# Patient Record
Sex: Female | Born: 1950 | Race: White | Hispanic: No | Marital: Married | Smoking: Never smoker
Health system: Southern US, Community
[De-identification: ages and names within clinical notes are randomized; demographics above are authoritative.]

## PROBLEM LIST (undated history)

## (undated) DIAGNOSIS — R011 Cardiac murmur, unspecified: Secondary | ICD-10-CM

## (undated) DIAGNOSIS — I10 Essential (primary) hypertension: Secondary | ICD-10-CM

## (undated) DIAGNOSIS — E785 Hyperlipidemia, unspecified: Secondary | ICD-10-CM

## (undated) HISTORY — DX: Cardiac murmur, unspecified: R01.1

## (undated) HISTORY — DX: Hyperlipidemia, unspecified: E78.5

## (undated) HISTORY — PX: OVARIAN CYST REMOVAL: SHX89

## (undated) HISTORY — PX: APPENDECTOMY: SHX54

## (undated) HISTORY — DX: Essential (primary) hypertension: I10

---

## 1998-03-18 ENCOUNTER — Emergency Department (HOSPITAL_COMMUNITY): Admission: EM | Admit: 1998-03-18 | Discharge: 1998-03-19 | Payer: Self-pay | Admitting: Emergency Medicine

## 1998-03-20 ENCOUNTER — Other Ambulatory Visit: Admission: RE | Admit: 1998-03-20 | Discharge: 1998-03-20 | Payer: Self-pay | Admitting: Gynecology

## 1998-03-20 ENCOUNTER — Other Ambulatory Visit: Admission: RE | Admit: 1998-03-20 | Discharge: 1998-03-20 | Payer: Self-pay | Admitting: Internal Medicine

## 1998-03-21 ENCOUNTER — Other Ambulatory Visit: Admission: RE | Admit: 1998-03-21 | Discharge: 1998-03-21 | Payer: Self-pay | Admitting: Internal Medicine

## 1998-03-22 ENCOUNTER — Other Ambulatory Visit: Admission: RE | Admit: 1998-03-22 | Discharge: 1998-03-22 | Payer: Self-pay | Admitting: Internal Medicine

## 1998-03-29 ENCOUNTER — Ambulatory Visit (HOSPITAL_COMMUNITY): Admission: RE | Admit: 1998-03-29 | Discharge: 1998-03-29 | Payer: Self-pay | Admitting: Internal Medicine

## 2003-12-29 ENCOUNTER — Inpatient Hospital Stay (HOSPITAL_COMMUNITY): Admission: EM | Admit: 2003-12-29 | Discharge: 2004-01-02 | Payer: Self-pay | Admitting: Emergency Medicine

## 2004-01-26 ENCOUNTER — Encounter: Admission: RE | Admit: 2004-01-26 | Discharge: 2004-04-25 | Payer: Self-pay | Admitting: Family Medicine

## 2005-03-12 ENCOUNTER — Ambulatory Visit: Payer: Self-pay | Admitting: Family Medicine

## 2005-04-19 IMAGING — CR DG ABDOMEN ACUTE W/ 1V CHEST
4 series · 4 of 4 positions shown · non-contrast
Comparison: none

CLINICAL DATA: 53 year old with increased heart rate and pain.
 ACUTE ABDOMEN SERIES WITH CHEST ? 12/29/03 
 No prior exams for comparison. 
 The cardiomediastinal silhouette is within normal limits.  The lungs are free of focal consolidations and pleural effusions.  No free intraperitoneal air is seen beneath the diaphragm.  
 Supine and erect views are performed of the abdomen showing a nonobstructive bowel gas pattern.  Scattered calcifications within the pelvis are likely vascular in origin.
 IMPRESSION
 No evidence for acute cardiopulmonary disease or bowel obstruction.

[view not recorded (1 of 4)]
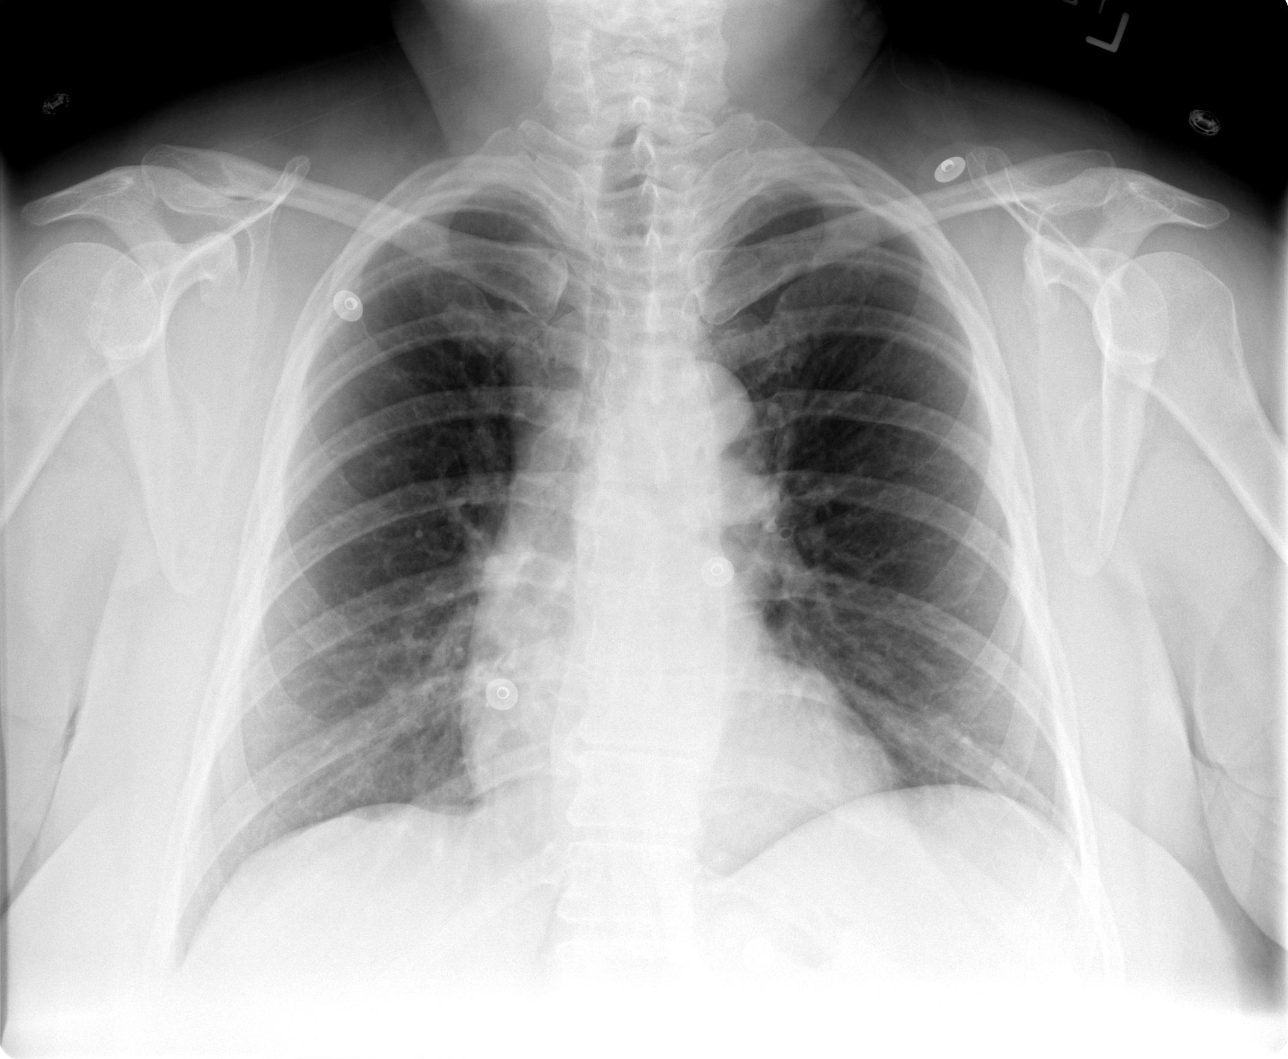

[view not recorded (2 of 4)]
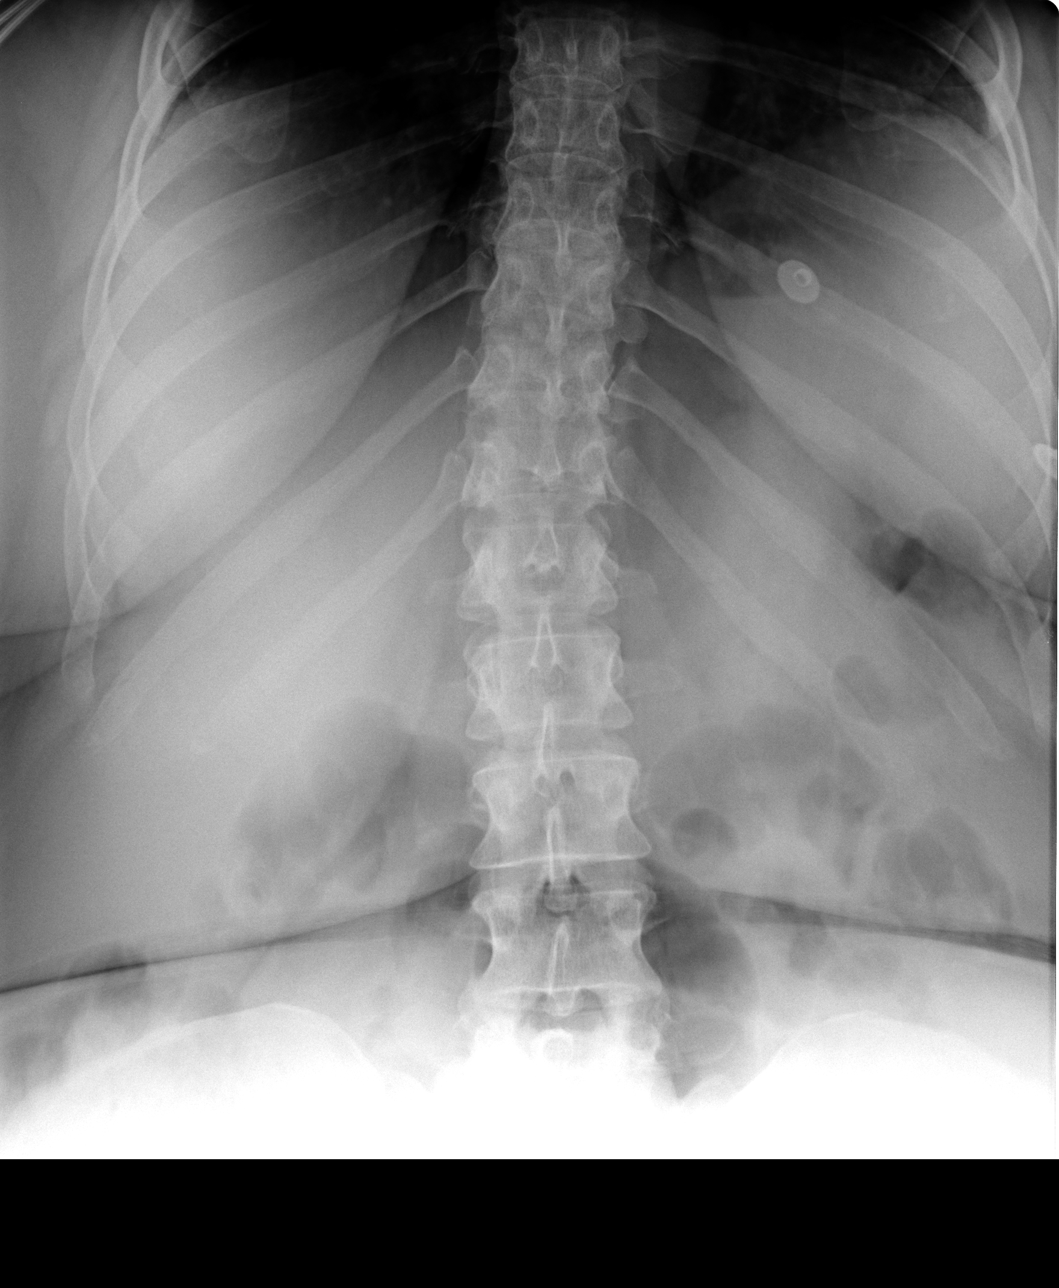

[view not recorded (3 of 4)]
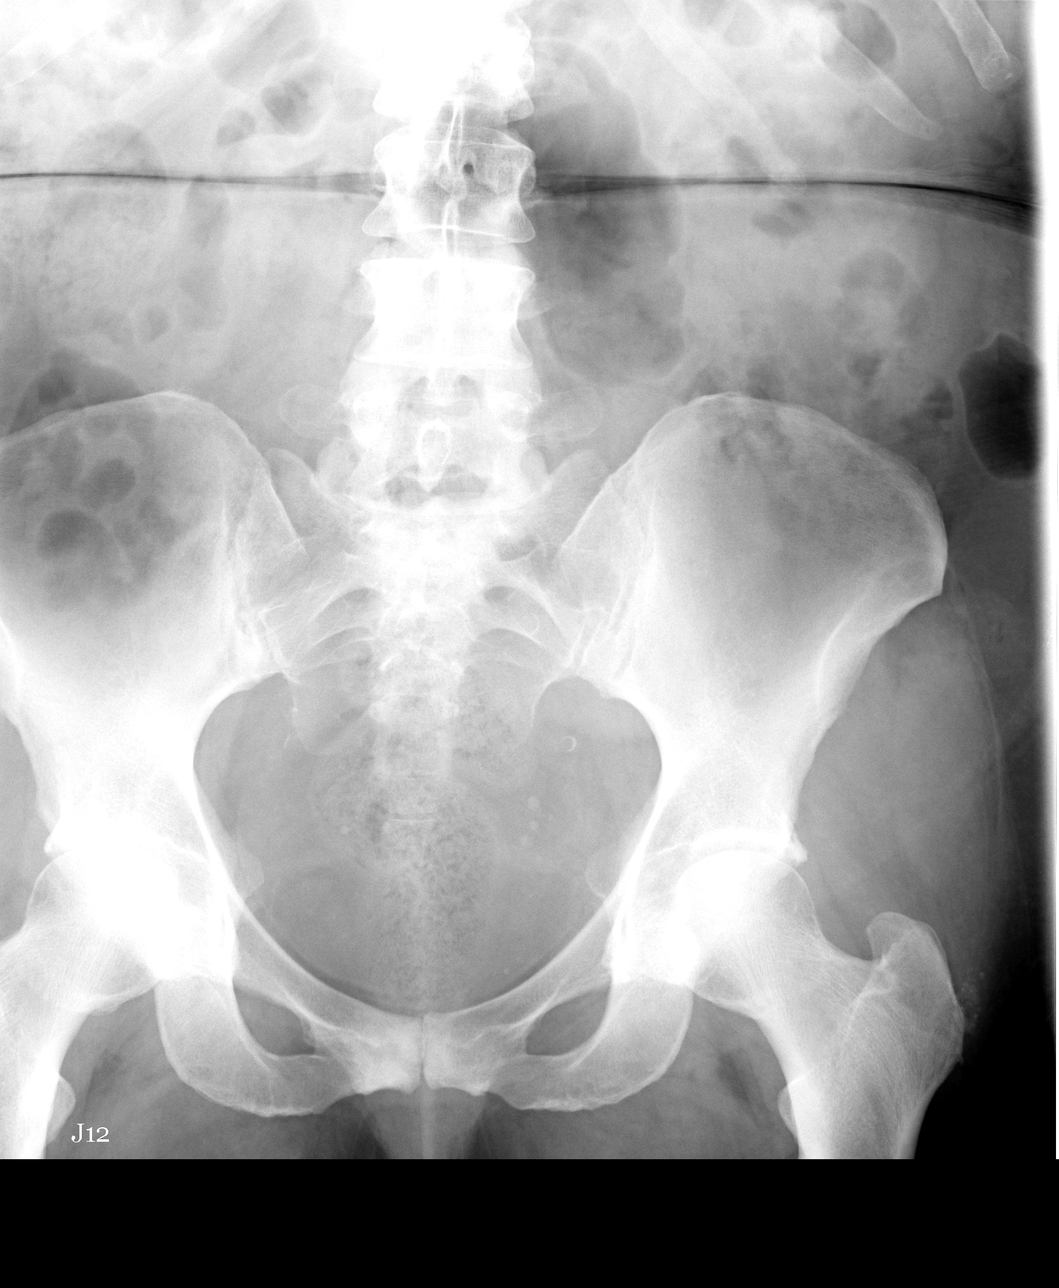

[view not recorded (4 of 4)]
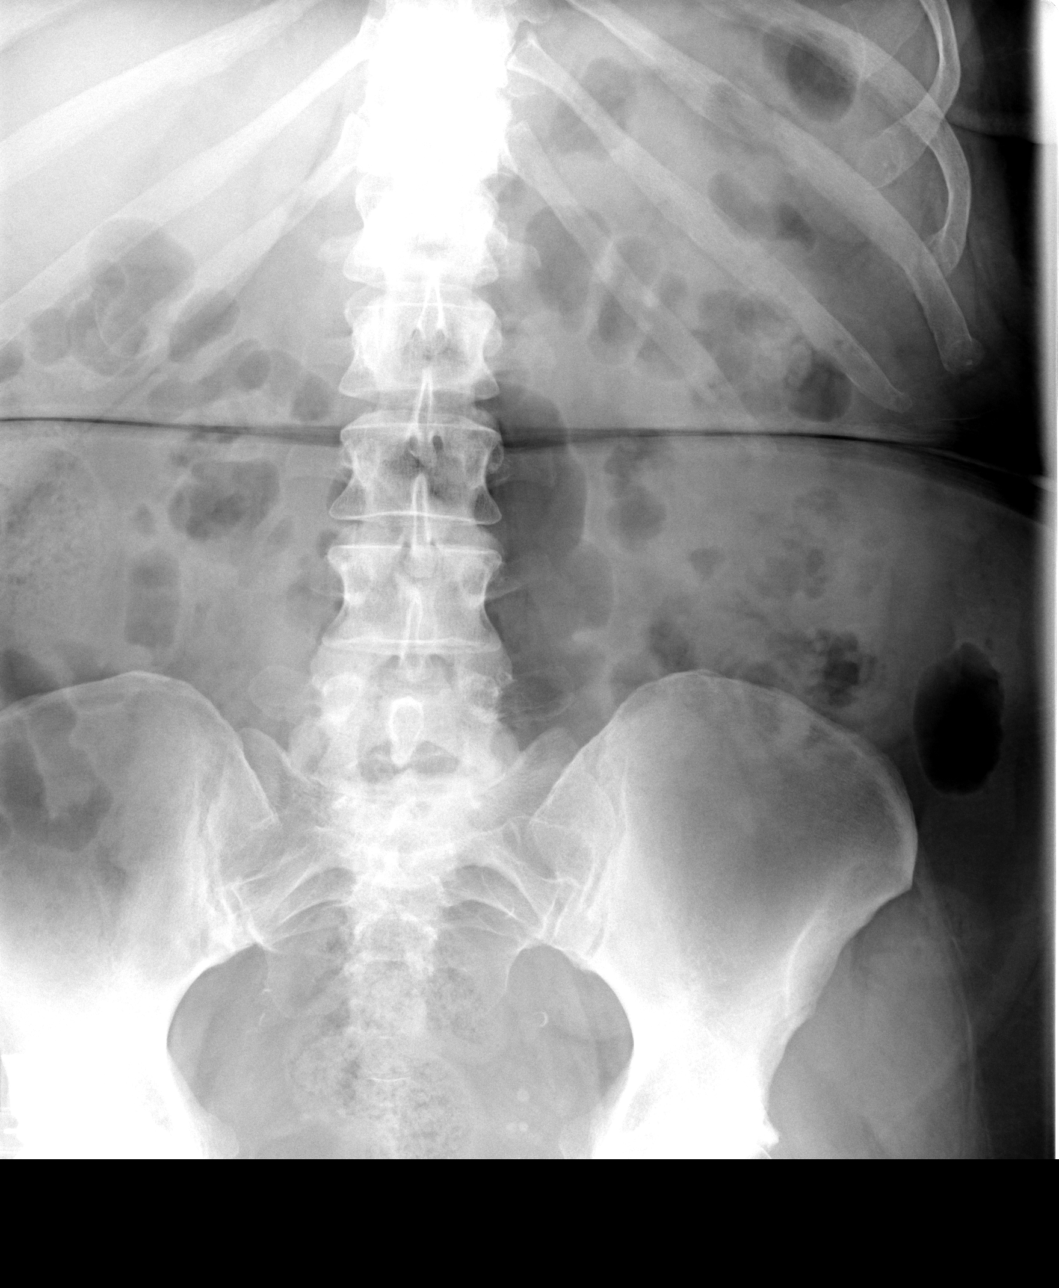

[4 of 4 positions shown; findings below may reference images not displayed]

## 2006-03-25 ENCOUNTER — Ambulatory Visit: Payer: Self-pay | Admitting: Family Medicine

## 2006-11-23 ENCOUNTER — Ambulatory Visit: Payer: Self-pay | Admitting: Family Medicine

## 2006-11-23 LAB — CONVERTED CEMR LAB
ALT: 22 units/L (ref 0–40)
Albumin: 4.3 g/dL (ref 3.5–5.2)
Alkaline Phosphatase: 41 units/L (ref 39–117)
CO2: 29 meq/L (ref 19–32)
Creatinine,U: 27 mg/dL
GFR calc Af Amer: 96 mL/min
GFR calc non Af Amer: 80 mL/min
Glucose, Bld: 99 mg/dL (ref 70–99)
Hgb A1c MFr Bld: 7.7 % — ABNORMAL HIGH (ref 4.6–6.0)
Microalb Creat Ratio: 40.7 mg/g — ABNORMAL HIGH (ref 0.0–30.0)
Potassium: 4.3 meq/L (ref 3.5–5.1)
Total Bilirubin: 0.6 mg/dL (ref 0.3–1.2)

## 2006-12-16 ENCOUNTER — Encounter: Payer: Self-pay | Admitting: Family Medicine

## 2007-09-03 ENCOUNTER — Telehealth (INDEPENDENT_AMBULATORY_CARE_PROVIDER_SITE_OTHER): Payer: Self-pay | Admitting: *Deleted

## 2007-10-07 ENCOUNTER — Ambulatory Visit: Payer: Self-pay | Admitting: Family Medicine

## 2007-10-07 DIAGNOSIS — N83209 Unspecified ovarian cyst, unspecified side: Secondary | ICD-10-CM | POA: Insufficient documentation

## 2007-10-07 DIAGNOSIS — Z8679 Personal history of other diseases of the circulatory system: Secondary | ICD-10-CM | POA: Insufficient documentation

## 2007-10-07 DIAGNOSIS — I10 Essential (primary) hypertension: Secondary | ICD-10-CM | POA: Insufficient documentation

## 2007-10-07 DIAGNOSIS — E785 Hyperlipidemia, unspecified: Secondary | ICD-10-CM | POA: Insufficient documentation

## 2007-10-07 DIAGNOSIS — E1151 Type 2 diabetes mellitus with diabetic peripheral angiopathy without gangrene: Secondary | ICD-10-CM | POA: Insufficient documentation

## 2007-10-07 DIAGNOSIS — Z9089 Acquired absence of other organs: Secondary | ICD-10-CM | POA: Insufficient documentation

## 2007-10-07 DIAGNOSIS — E1165 Type 2 diabetes mellitus with hyperglycemia: Secondary | ICD-10-CM | POA: Insufficient documentation

## 2007-10-08 ENCOUNTER — Ambulatory Visit: Payer: Self-pay | Admitting: Family Medicine

## 2007-10-14 ENCOUNTER — Telehealth: Payer: Self-pay | Admitting: Family Medicine

## 2007-10-20 LAB — CONVERTED CEMR LAB
ALT: 24 units/L (ref 0–35)
BUN: 21 mg/dL (ref 6–23)
Bilirubin, Direct: 0.1 mg/dL (ref 0.0–0.3)
CO2: 30 meq/L (ref 19–32)
Calcium: 10.1 mg/dL (ref 8.4–10.5)
Cholesterol: 128 mg/dL (ref 0–200)
Creatinine,U: 63.1 mg/dL
GFR calc non Af Amer: 79 mL/min
Hgb A1c MFr Bld: 6.7 % — ABNORMAL HIGH (ref 4.6–6.0)
LDL Cholesterol: 52 mg/dL (ref 0–99)
Microalb Creat Ratio: 6.3 mg/g (ref 0.0–30.0)
Microalb, Ur: 0.4 mg/dL (ref 0.0–1.9)
Total Protein: 6.9 g/dL (ref 6.0–8.3)
Triglycerides: 86 mg/dL (ref 0–149)

## 2008-02-03 ENCOUNTER — Ambulatory Visit: Payer: Self-pay | Admitting: Family Medicine

## 2008-02-13 LAB — CONVERTED CEMR LAB
Albumin: 3.9 g/dL (ref 3.5–5.2)
Bilirubin, Direct: 0.1 mg/dL (ref 0.0–0.3)
Cholesterol: 135 mg/dL (ref 0–200)
GFR calc Af Amer: 96 mL/min
GFR calc non Af Amer: 79 mL/min
HDL: 46.6 mg/dL (ref >39.0)
Hgb A1c MFr Bld: 7.1 % — ABNORMAL HIGH (ref 4.6–6.0)
LDL Cholesterol: 61 mg/dL (ref 0–99)
Sodium: 140 meq/L (ref 135–145)
Total CHOL/HDL Ratio: 2.9
Total Protein: 6.8 g/dL (ref 6.0–8.3)

## 2008-02-14 ENCOUNTER — Encounter (INDEPENDENT_AMBULATORY_CARE_PROVIDER_SITE_OTHER): Payer: Self-pay | Admitting: *Deleted

## 2008-04-17 ENCOUNTER — Telehealth (INDEPENDENT_AMBULATORY_CARE_PROVIDER_SITE_OTHER): Payer: Self-pay | Admitting: *Deleted

## 2008-08-23 ENCOUNTER — Telehealth (INDEPENDENT_AMBULATORY_CARE_PROVIDER_SITE_OTHER): Payer: Self-pay | Admitting: *Deleted

## 2008-09-19 ENCOUNTER — Ambulatory Visit: Payer: Self-pay | Admitting: Family Medicine

## 2008-09-19 LAB — CONVERTED CEMR LAB
Glucose, Urine, Semiquant: NEGATIVE
Ketones, urine, test strip: NEGATIVE
Nitrite: NEGATIVE
Protein, U semiquant: NEGATIVE
Specific Gravity, Urine: 1.015
WBC Urine, dipstick: NEGATIVE

## 2008-10-05 ENCOUNTER — Encounter (INDEPENDENT_AMBULATORY_CARE_PROVIDER_SITE_OTHER): Payer: Self-pay | Admitting: *Deleted

## 2008-10-05 LAB — CONVERTED CEMR LAB
ALT: 26 units/L (ref 0–35)
Albumin: 4.1 g/dL (ref 3.5–5.2)
Alkaline Phosphatase: 49 units/L (ref 39–117)
BUN: 19 mg/dL (ref 6–23)
Calcium: 10.3 mg/dL (ref 8.4–10.5)
Chloride: 100 meq/L (ref 96–112)
Creatinine, Ser: 0.8 mg/dL (ref 0.4–1.2)
Creatinine,U: 136 mg/dL
GFR calc Af Amer: 96 mL/min
GFR calc non Af Amer: 79 mL/min
HCT: 38.7 % (ref 36.0–46.0)
HDL: 59.8 mg/dL (ref >39.0)
Hemoglobin: 12.7 g/dL (ref 12.0–15.0)
Hgb A1c MFr Bld: 6.8 % — ABNORMAL HIGH (ref 4.6–6.0)
MCV: 84.5 fL (ref 78.0–100.0)
Monocytes Absolute: 0.5 10*3/uL (ref 0.1–1.0)
Monocytes Relative: 8.1 % (ref 3.0–12.0)
Platelets: 210 10*3/uL (ref 150–400)
Potassium: 4.4 meq/L (ref 3.5–5.1)
VLDL: 27 mg/dL (ref 0–40)
WBC: 6.5 10*3/uL (ref 4.5–10.5)

## 2009-03-23 ENCOUNTER — Encounter (INDEPENDENT_AMBULATORY_CARE_PROVIDER_SITE_OTHER): Payer: Self-pay | Admitting: *Deleted

## 2009-05-15 ENCOUNTER — Ambulatory Visit: Payer: Self-pay | Admitting: Family Medicine

## 2009-05-17 ENCOUNTER — Encounter (INDEPENDENT_AMBULATORY_CARE_PROVIDER_SITE_OTHER): Payer: Self-pay | Admitting: *Deleted

## 2009-05-17 LAB — CONVERTED CEMR LAB
Albumin: 3.9 g/dL (ref 3.5–5.2)
Bilirubin, Direct: 0 mg/dL (ref 0.0–0.3)
CO2: 30 meq/L (ref 19–32)
Calcium: 9.8 mg/dL (ref 8.4–10.5)
GFR calc non Af Amer: 60.87 mL/min (ref >60)
HDL: 50 mg/dL (ref >39.00)
Hgb A1c MFr Bld: 6.4 % (ref 4.6–6.5)
Potassium: 4.6 meq/L (ref 3.5–5.1)
Total Bilirubin: 0.8 mg/dL (ref 0.3–1.2)
Total Protein: 7 g/dL (ref 6.0–8.3)
Triglycerides: 88 mg/dL (ref 0.0–149.0)

## 2009-06-29 ENCOUNTER — Encounter (INDEPENDENT_AMBULATORY_CARE_PROVIDER_SITE_OTHER): Payer: Self-pay | Admitting: *Deleted

## 2009-07-02 ENCOUNTER — Telehealth (INDEPENDENT_AMBULATORY_CARE_PROVIDER_SITE_OTHER): Payer: Self-pay | Admitting: *Deleted

## 2009-07-31 ENCOUNTER — Telehealth (INDEPENDENT_AMBULATORY_CARE_PROVIDER_SITE_OTHER): Payer: Self-pay | Admitting: *Deleted

## 2009-10-04 ENCOUNTER — Ambulatory Visit: Payer: Self-pay | Admitting: Family Medicine

## 2009-10-04 LAB — CONVERTED CEMR LAB
Bilirubin Urine: NEGATIVE
Blood in Urine, dipstick: NEGATIVE
Ketones, urine, test strip: NEGATIVE
pH: 5

## 2009-10-04 LAB — HM MAMMOGRAPHY

## 2009-10-04 LAB — HM COLONOSCOPY

## 2009-10-11 LAB — CONVERTED CEMR LAB
ALT: 26 units/L (ref 0–35)
Alkaline Phosphatase: 45 units/L (ref 39–117)
Basophils Relative: 0.5 % (ref 0.0–3.0)
Bilirubin, Direct: 0 mg/dL (ref 0.0–0.3)
Calcium: 9.9 mg/dL (ref 8.4–10.5)
Cholesterol: 129 mg/dL (ref 0–200)
Creatinine, Ser: 0.9 mg/dL (ref 0.4–1.2)
Eosinophils Absolute: 0.2 10*3/uL (ref 0.0–0.7)
Eosinophils Relative: 4 % (ref 0.0–5.0)
HDL: 66.6 mg/dL (ref >39.00)
Hgb A1c MFr Bld: 6.6 % — ABNORMAL HIGH (ref 4.6–6.5)
LDL Cholesterol: 46 mg/dL (ref 0–99)
Lymphocytes Relative: 40.6 % (ref 12.0–46.0)
Lymphs Abs: 2 10*3/uL (ref 0.7–4.0)
MCHC: 32.9 g/dL (ref 30.0–36.0)
Microalb, Ur: 3 mg/dL — ABNORMAL HIGH (ref 0.0–1.9)
Monocytes Absolute: 0.4 10*3/uL (ref 0.1–1.0)
Monocytes Relative: 7.4 % (ref 3.0–12.0)
Neutro Abs: 2.4 10*3/uL (ref 1.4–7.7)
Neutrophils Relative %: 47.5 % (ref 43.0–77.0)
RBC: 4.44 M/uL (ref 3.87–5.11)
RDW: 13 % (ref 11.5–14.6)
Sodium: 142 meq/L (ref 135–145)
TSH: 2.19 microintl units/mL (ref 0.35–5.50)
VLDL: 16 mg/dL (ref 0.0–40.0)

## 2010-05-28 ENCOUNTER — Encounter (INDEPENDENT_AMBULATORY_CARE_PROVIDER_SITE_OTHER): Payer: Self-pay | Admitting: *Deleted

## 2010-06-27 ENCOUNTER — Ambulatory Visit: Payer: Self-pay | Admitting: Family Medicine

## 2010-07-01 LAB — CONVERTED CEMR LAB
AST: 20 units/L (ref 0–37)
Alkaline Phosphatase: 43 units/L (ref 39–117)
CO2: 29 meq/L (ref 19–32)
Cholesterol: 122 mg/dL (ref 0–200)
Creatinine, Ser: 0.9 mg/dL (ref 0.4–1.2)
Hgb A1c MFr Bld: 6.1 % (ref 4.6–6.5)
LDL Cholesterol: 53 mg/dL (ref 0–99)
Potassium: 4.3 meq/L (ref 3.5–5.1)
Total Protein: 6.8 g/dL (ref 6.0–8.3)
Triglycerides: 96 mg/dL (ref 0.0–149.0)

## 2010-07-18 ENCOUNTER — Telehealth: Payer: Self-pay | Admitting: Family Medicine

## 2010-10-10 NOTE — Progress Notes (Signed)
Summary: refill--Actos  Phone Note Refill Request Message from:  Fax from Pharmacy on July 18, 2010 8:24 AM  Refills Requested: Medication #1:  ACTOS 45 MG  TABS 1 by mouth once daily [facility] - fax [phone]  Initial call taken by: Okey Regal Spring,  July 18, 2010 8:25 AM    Prescriptions: ACTOS 45 MG  TABS (PIOGLITAZONE HCL) 1 by mouth once daily  #30 Tablet x 2   Entered by:   Lucious Groves CMA   Authorized by:   Loreen Freud DO   Signed by:   Lucious Groves CMA on 07/18/2010   Method used:   Electronically to        [facility] [phone]* (retail)       [address]       [place], [place]  [address]       Ph: [phone]       Fax: [phone]   RxID:   [id number]

## 2010-10-10 NOTE — Assessment & Plan Note (Signed)
Summary: cpx/ns/kdc   Vital Signs:  Patient profile:   60 year old female Height:      64 inches Weight:      305 pounds BMI:     52.54 Temp:     98.5 degrees F oral Pulse rate:   82 / minute Pulse rhythm:   regular BP sitting:   140 / 80  (left arm) Cuff size:   regular  Vitals Entered By: Army Fossa CMA (October 04, 2009 9:10 AM) CC: Pt here for med refill- does not want CPX- does not have time. Pt is fasting.    History of Present Illness:  Hyperlipidemia follow-up      This is a 60 year old woman who presents for Hyperlipidemia follow-up.  The patient denies muscle aches, GI upset, abdominal pain, flushing, itching, constipation, diarrhea, and fatigue.  The patient denies the following symptoms: chest pain/pressure, exercise intolerance, dypsnea, palpitations, syncope, and pedal edema.  Compliance with medications (by patient report) has been near 100%.  Dietary compliance has been fair.  The patient reports no exercise.  Adjunctive measures currently used by the patient include ASA, fish oil supplements, and weight reduction.    Hypertension follow-up      The patient also presents for Hypertension follow-up.  The patient complains of edema, but denies lightheadedness, urinary frequency, headaches, impotence, rash, and fatigue.  Associated symptoms include leg edema.  The patient denies the following associated symptoms: chest pain, chest pressure, exercise intolerance, dyspnea, palpitations, syncope, and pedal edema.  Compliance with medications (by patient report) has been near 100%.  The patient reports that dietary compliance has been fair.  The patient reports no exercise.    Type 1 diabetes mellitus follow-up      The patient is also here for Type 2 diabetes mellitus follow-up.  The patient complains of weight gain, but denies polyuria, polydipsia, blurred vision, self managed hypoglycemia, hypoglycemia requiring help, weight loss, and numbness of extremities.  The patient  denies the following symptoms: neuropathic pain, chest pain, vomiting, orthostatic symptoms, poor wound healing, intermittent claudication, vision loss, and foot ulcer.  Since the last visit the patient reports poor dietary compliance, compliance with medications, not exercising regularly, and not monitoring blood glucose.  Since the last visit, the patient reports having had no eye care and no foot care.    Current Medications (verified): 1)  Actos 45 Mg  Tabs (Pioglitazone Hcl) .Marland Kitchen.. 1 By Mouth Once Daily 2)  Lisinopril 5 Mg  Tabs (Lisinopril) .Marland Kitchen.. 1 By Mouth Once Daily 3)  Norvasc 10 Mg  Tabs (Amlodipine Besylate) .Marland Kitchen.. 1 By Mouth Once Daily 4)  Metformin Hcl 1000 Mg Tabs (Metformin Hcl) .Marland Kitchen.. 1 By Mouth Two Times A Day 5)  Metoprolol Tartrate 25 Mg  Tabs (Metoprolol Tartrate) .Marland Kitchen.. 1 By Mouth Three Times A Day 6)  Enalapril Maleate 10 Mg  Tabs (Enalapril Maleate) .Marland Kitchen.. 1 By Mouth Two Times A Day 7)  Vytorin 10-80 Mg  Tabs (Ezetimibe-Simvastatin) .Marland Kitchen.. 1 By Mouth At Bedtime, 8)  Lantus Solostar 100 Unit/ml  Soln (Insulin Glargine) .... Inject 40 Units Subq Every Evening 9)  Bd U/f Short Pen Needle 31g X 8 Mm  Misc (Insulin Pen Needle) .... Use As Directed 10)  Furosemide 20 Mg Tabs (Furosemide) .Marland Kitchen.. 1 By Mouth Qam 11)  One Touch Ultra Strips .... Accu Two Times A Day 12)  Calcium 600 600 Mg Tabs (Calcium Carbonate) .Marland Kitchen.. 1 By Mouth Once Daily 13)  Multivitamins  Tabs (Multiple Vitamin) .Marland KitchenMarland KitchenMarland Kitchen  1 By Mouth Once Daily 14)  Biotin 10 Mg Tabs (Biotin)  Allergies (verified): No Known Drug Allergies  Past History:  Past medical, surgical, family and social histories (including risk factors) reviewed for relevance to current acute and chronic problems.  Past Medical History: Reviewed history from 10/07/2007 and no changes required. Diabetes mellitus, type II Hyperlipidemia Hypertension  Family History: Reviewed history and no changes required.  Social History: Reviewed history and no changes  required.  Review of Systems      See HPI  Physical Exam  General:  Well-developed,well-nourished,in no acute distress; alert,appropriate and cooperative throughout examination Lungs:  Normal respiratory effort, chest expands symmetrically. Lungs are clear to auscultation, no crackles or wheezes. Heart:  normal rate and no murmur.   Extremities:  No clubbing, cyanosis, edema, or deformity noted with normal full range of motion of all joints.    Diabetes Management Exam:    Foot Exam (with socks and/or shoes not present):       Sensory-Pinprick/Light touch:          Left medial foot (L-4): normal          Left dorsal foot (L-5): normal          Left lateral foot (S-1): normal          Right medial foot (L-4): normal          Right dorsal foot (L-5): normal          Right lateral foot (S-1): normal       Sensory-Monofilament:          Left foot: normal          Right foot: normal       Inspection:          Left foot: normal          Right foot: normal       Nails:          Left foot: normal          Right foot: normal    Eye Exam:       Eye Exam done elsewhere   Impression & Recommendations:  Problem # 1:  HYPERTENSION (ICD-401.9)  Her updated medication list for this problem includes:    Lisinopril 5 Mg Tabs (Lisinopril) .Marland Kitchen... 1 by mouth once daily    Norvasc 10 Mg Tabs (Amlodipine besylate) .Marland Kitchen... 1 by mouth once daily    Metoprolol Tartrate 25 Mg Tabs (Metoprolol tartrate) .Marland Kitchen... 1 by mouth three times a day    Enalapril Maleate 10 Mg Tabs (Enalapril maleate) .Marland Kitchen... 1 by mouth two times a day    Furosemide 20 Mg Tabs (Furosemide) .Marland Kitchen... 1 by mouth qam  Orders: Venipuncture (54098) TLB-Lipid Panel (80061-LIPID) TLB-BMP (Basic Metabolic Panel-BMET) (80048-METABOL) TLB-CBC Platelet - w/Differential (85025-CBCD) TLB-Hepatic/Liver Function Pnl (80076-HEPATIC) TLB-TSH (Thyroid Stimulating Hormone) (84443-TSH) TLB-A1C / Hgb A1C (Glycohemoglobin)  (83036-A1C) TLB-Microalbumin/Creat Ratio, Urine (82043-MALB) UA Dipstick w/o Micro (manual) (11914)  BP today: 140/80 Prior BP: 160/90 (09/19/2008)  Labs Reviewed: K+: 4.6 (05/15/2009) Creat: : 1.0 (05/15/2009)   Chol: 123 (05/15/2009)   HDL: 50.00 (05/15/2009)   LDL: 55 (05/15/2009)   TG: 88.0 (05/15/2009)  Problem # 2:  HYPERLIPIDEMIA (ICD-272.4)  Her updated medication list for this problem includes:    Vytorin 10-80 Mg Tabs (Ezetimibe-simvastatin) .Marland Kitchen... 1 by mouth at bedtime,  Orders: Venipuncture (78295) TLB-Lipid Panel (80061-LIPID) TLB-BMP (Basic Metabolic Panel-BMET) (80048-METABOL) TLB-CBC Platelet - w/Differential (85025-CBCD) TLB-Hepatic/Liver Function Pnl (80076-HEPATIC) TLB-TSH (Thyroid Stimulating  Hormone) (84443-TSH) TLB-A1C / Hgb A1C (Glycohemoglobin) (83036-A1C) TLB-Microalbumin/Creat Ratio, Urine (82043-MALB) UA Dipstick w/o Micro (manual) (81191)  Labs Reviewed: SGOT: 21 (05/15/2009)   SGPT: 24 (05/15/2009)   HDL:50.00 (05/15/2009), 59.8 (09/19/2008)  LDL:55 (05/15/2009), 65 (09/19/2008)  Chol:123 (05/15/2009), 152 (09/19/2008)  Trig:88.0 (05/15/2009), 135 (09/19/2008)  Problem # 3:  DIABETES MELLITUS, TYPE II (ICD-250.00)  Her updated medication list for this problem includes:    Actos 45 Mg Tabs (Pioglitazone hcl) .Marland Kitchen... 1 by mouth once daily    Lisinopril 5 Mg Tabs (Lisinopril) .Marland Kitchen... 1 by mouth once daily    Metformin Hcl 1000 Mg Tabs (Metformin hcl) .Marland Kitchen... 1 by mouth two times a day    Enalapril Maleate 10 Mg Tabs (Enalapril maleate) .Marland Kitchen... 1 by mouth two times a day    Lantus Solostar 100 Unit/ml Soln (Insulin glargine) ..... Inject 40 units subq every evening  Orders: Venipuncture (47829) TLB-Lipid Panel (80061-LIPID) TLB-BMP (Basic Metabolic Panel-BMET) (80048-METABOL) TLB-CBC Platelet - w/Differential (85025-CBCD) TLB-Hepatic/Liver Function Pnl (80076-HEPATIC) TLB-TSH (Thyroid Stimulating Hormone) (84443-TSH) TLB-A1C / Hgb A1C (Glycohemoglobin)  (83036-A1C) TLB-Microalbumin/Creat Ratio, Urine (82043-MALB) UA Dipstick w/o Micro (manual) (56213)  Labs Reviewed: Creat: 1.0 (05/15/2009)    Reviewed HgBA1c results: 6.4 (05/15/2009)  6.8 (09/19/2008)  Complete Medication List: 1)  Actos 45 Mg Tabs (Pioglitazone hcl) .Marland Kitchen.. 1 by mouth once daily 2)  Lisinopril 5 Mg Tabs (Lisinopril) .Marland Kitchen.. 1 by mouth once daily 3)  Norvasc 10 Mg Tabs (Amlodipine besylate) .Marland Kitchen.. 1 by mouth once daily 4)  Metformin Hcl 1000 Mg Tabs (Metformin hcl) .Marland Kitchen.. 1 by mouth two times a day 5)  Metoprolol Tartrate 25 Mg Tabs (Metoprolol tartrate) .Marland Kitchen.. 1 by mouth three times a day 6)  Enalapril Maleate 10 Mg Tabs (Enalapril maleate) .Marland Kitchen.. 1 by mouth two times a day 7)  Vytorin 10-80 Mg Tabs (Ezetimibe-simvastatin) .Marland Kitchen.. 1 by mouth at bedtime, 8)  Lantus Solostar 100 Unit/ml Soln (Insulin glargine) .... Inject 40 units subq every evening 9)  Bd U/f Short Pen Needle 31g X 8 Mm Misc (Insulin pen needle) .... Use as directed 10)  Furosemide 20 Mg Tabs (Furosemide) .Marland Kitchen.. 1 by mouth qam 11)  One Touch Ultra Strips  .... Accu two times a day 12)  Calcium 600 600 Mg Tabs (Calcium carbonate) .Marland Kitchen.. 1 by mouth once daily 13)  Multivitamins Tabs (Multiple vitamin) .Marland Kitchen.. 1 by mouth once daily 14)  Biotin 10 Mg Tabs (Biotin) Prescriptions: FUROSEMIDE 20 MG TABS (FUROSEMIDE) 1 by mouth qam  #30 x 5   Entered and Authorized by:   Loreen Freud DO   Signed by:   Loreen Freud DO on 10/04/2009   Method used:   Electronically to        [facility] [phone]* (retail)       [address]       [place], [place]  [address]       Ph: [phone]       Fax: [phone]   RxID:   [id number] BD U/F SHORT PEN NEEDLE 31G X 8 MM  MISC (INSULIN PEN NEEDLE) USE AS DIRECTED  #100 x 3   Entered and Authorized by:   Loreen Freud DO   Signed by:   Loreen Freud DO on 10/04/2009   Method used:   Electronically to        [facility] [phone]* (retail)       [address]       [place], [place]   [address]  Ph: [phone]       Fax: [phone]   RxID:   [id number] VYTORIN 10-80 MG  TABS (EZETIMIBE-SIMVASTATIN) 1 by mouth at bedtime,  #30 Tablet x 5   Entered and Authorized by:   Loreen Freud DO   Signed by:   Loreen Freud DO on 10/04/2009   Method used:   Electronically to        [facility] [phone]* (retail)       [address]       [place], [place]  [address]       Ph: [phone]       Fax: [phone]   RxID:   [id number] HYDROCHLOROTHIAZIDE 25 MG  TABS (HYDROCHLOROTHIAZIDE) take one half tablet daily  #100 x 2   Entered and Authorized by:   Loreen Freud DO   Signed by:   Loreen Freud DO on 10/04/2009   Method used:   Electronically to        [facility] [phone]* (retail)       [address]       [place], [place]  [address]       Ph: [phone]       Fax: [phone]   RxID:   [id number] LANTUS SOLOSTAR 100 UNIT/ML  SOLN (INSULIN GLARGINE) INJECT 40 UNITS SUBQ EVERY EVENING  #1 month x 11   Entered and Authorized by:   Loreen Freud DO   Signed by:   Loreen Freud DO on 10/04/2009   Method used:   Electronically to        [facility] [phone]* (retail)       [address]       [place], [place]  [address]       Ph: [phone]       Fax: [phone]   RxID:   [id number] ENALAPRIL MALEATE 10 MG  TABS (ENALAPRIL MALEATE) 1 by mouth two times a day  #60 x 5   Entered and Authorized by:   Loreen Freud DO   Signed by:   Loreen Freud DO on 10/04/2009   Method used:   Electronically to        [facility] [phone]* (retail)       [address]       [place], [place]  [address]       Ph: [phone]       Fax: [phone]   RxID:   [id number] METOPROLOL TARTRATE 25 MG  TABS (METOPROLOL TARTRATE) 1 by mouth three times a day  #90 x 5   Entered and Authorized by:   Loreen Freud DO   Signed by:   Loreen Freud DO on 10/04/2009   Method used:   Electronically to        [facility] [phone]* (retail)       [address]        [place], [place]  [address]       Ph: [phone]       Fax: [phone]   RxID:   [id number] METFORMIN HCL 1000 MG TABS (METFORMIN HCL) 1 by mouth two times a day  #60 x 5   Entered and Authorized by:   Loreen Freud DO   Signed by:   Loreen Freud DO on 10/04/2009   Method used:   Electronically to        [facility] [phone]* (retail)       [address]       [place], [place]  [address]       Ph: [phone]       Fax: [phone]   RxID:   [id number] NORVASC 10 MG  TABS (AMLODIPINE BESYLATE) 1 by mouth once daily  #30 x 5   Entered and Authorized by:   Loreen Freud DO   Signed by:   Loreen Freud DO on 10/04/2009   Method used:   Electronically to        [facility] [phone]* (retail)       [address]       [place], [place]  [address]       Ph: [phone]       Fax: [phone]   RxID:   [id number] LISINOPRIL 5 MG  TABS (LISINOPRIL) 1 by mouth once daily  #30 x 5   Entered and Authorized by:   Loreen Freud DO   Signed by:   Loreen Freud DO on 10/04/2009   Method used:   Electronically to        [facility] [phone]* (retail)       [address]       [place], [place]  [address]       Ph: [phone]       Fax: [phone]   RxID:   [id number] ACTOS 45 MG  TABS (PIOGLITAZONE HCL) 1 by mouth once daily  #30 x 5   Entered and Authorized by:   Loreen Freud DO   Signed by:   Loreen Freud DO on 10/04/2009   Method used:   Electronically to        [facility] [phone]* (retail)       [address]       [place], [place]  [address]       Ph: [phone]       Fax: [phone]   RxID:   [id number]     Flu Vaccine Next Due:  Refused Colonoscopy Next Due:  Refused PAP Next Due:  Refused Mammogram Next Due:  Refused     Laboratory Results   Urine Tests   Date/Time Reported: October 04, 2009 10:18 AM   Routine Urinalysis   Color: yellow Appearance: Clear Glucose: negative   (Normal Range: Negative) Bilirubin: negative   (Normal Range:  Negative) Ketone: negative   (Normal Range: Negative) Spec. Gravity: 1.020   (Normal Range: 1.003-1.035) Blood: negative   (Normal Range: Negative) pH: 5.0   (Normal Range: 5.0-8.0) Protein: trace   (Normal Range: Negative) Urobilinogen: negative   (Normal Range: 0-1) Nitrite: negative   (Normal Range: Negative) Leukocyte Esterace: negative   (Normal Range: Negative)    Comments: Floydene Flock  October 04, 2009 10:18 AM

## 2010-10-10 NOTE — Letter (Signed)
Summary: Primary Care Appointment Letter  [facility]  [address] [place], [place] [address]   Phone: [phone]  Fax: [phone]    05/28/2010 MRN: [id number]  [facility] [address] [place], [place]  [address]  Dear Ms. Jane Young,   Your Primary Care Physician Loreen Freud DO has indicated that:    _______it is time to schedule an appointment.    _______you missed your appointment on______ and need to call and          reschedule.    ___X____you need to have lab work done FASTING BEFORE FURTHER REFILLS CAN BE GIVEN.    _______you need to schedule an appointment discuss lab or test results.    _______you need to call to reschedule your appointment that is                       scheduled on _________.     Please call our office as soon as possible. Our phone number is [phone]_____.Our office is open 8a-5p, Monday through Friday.     Thank you,    [place] [facility] Scheduler

## 2010-11-29 ENCOUNTER — Other Ambulatory Visit: Payer: Self-pay | Admitting: Family Medicine

## 2010-12-11 ENCOUNTER — Other Ambulatory Visit: Payer: Self-pay | Admitting: Family Medicine

## 2010-12-18 ENCOUNTER — Encounter: Payer: Self-pay | Admitting: Family Medicine

## 2010-12-24 ENCOUNTER — Encounter: Payer: Self-pay | Admitting: Family Medicine

## 2010-12-26 ENCOUNTER — Ambulatory Visit (INDEPENDENT_AMBULATORY_CARE_PROVIDER_SITE_OTHER): Payer: PRIVATE HEALTH INSURANCE | Admitting: Family Medicine

## 2010-12-26 ENCOUNTER — Encounter: Payer: Self-pay | Admitting: Family Medicine

## 2010-12-26 DIAGNOSIS — I10 Essential (primary) hypertension: Secondary | ICD-10-CM

## 2010-12-26 DIAGNOSIS — E785 Hyperlipidemia, unspecified: Secondary | ICD-10-CM

## 2010-12-26 DIAGNOSIS — E119 Type 2 diabetes mellitus without complications: Secondary | ICD-10-CM

## 2010-12-26 LAB — HEPATIC FUNCTION PANEL
ALT: 39 U/L — ABNORMAL HIGH (ref 0–35)
AST: 40 U/L — ABNORMAL HIGH (ref 0–37)
Albumin: 4.3 g/dL (ref 3.5–5.2)
Alkaline Phosphatase: 59 U/L (ref 39–117)
Bilirubin, Direct: 0.1 mg/dL (ref 0.0–0.3)
Total Bilirubin: 0.9 mg/dL (ref 0.3–1.2)
Total Protein: 7.2 g/dL (ref 6.0–8.3)

## 2010-12-26 LAB — POCT URINALYSIS DIPSTICK
Ketones, UA: NEGATIVE
Leukocytes, UA: NEGATIVE
Spec Grav, UA: 1.01

## 2010-12-26 LAB — BASIC METABOLIC PANEL
Calcium: 10.2 mg/dL (ref 8.4–10.5)
Creatinine, Ser: 0.9 mg/dL (ref 0.4–1.2)
GFR: 69.24 mL/min (ref >60.00)
Potassium: 4.3 mEq/L (ref 3.5–5.1)
Sodium: 138 mEq/L (ref 135–145)

## 2010-12-26 LAB — MICROALBUMIN / CREATININE URINE RATIO: Creatinine,U: 25.9 mg/dL

## 2010-12-26 LAB — LIPID PANEL: Cholesterol: 129 mg/dL (ref 0–200)

## 2010-12-26 MED ORDER — METFORMIN HCL 1000 MG PO TABS: 1000.0000 mg | ORAL_TABLET | Freq: Two times a day (BID) | ORAL | Status: DC

## 2010-12-26 MED ORDER — ATORVASTATIN CALCIUM 20 MG PO TABS: 20.0000 mg | ORAL_TABLET | Freq: Every day | ORAL | Status: DC

## 2010-12-26 MED ORDER — AMLODIPINE BESYLATE 10 MG PO TABS: 10.0000 mg | ORAL_TABLET | Freq: Every day | ORAL | Status: DC

## 2010-12-26 MED ORDER — INSULIN DETEMIR 100 UNIT/ML ~~LOC~~ SOLN: 40.0000 [IU] | Freq: Every day | SUBCUTANEOUS | Status: DC

## 2010-12-26 MED ORDER — ENALAPRIL MALEATE 10 MG PO TABS: 10.0000 mg | ORAL_TABLET | Freq: Every day | ORAL | Status: DC

## 2010-12-26 MED ORDER — METOPROLOL TARTRATE 25 MG PO TABS: ORAL_TABLET | ORAL | Status: DC

## 2010-12-26 MED ORDER — FUROSEMIDE 20 MG PO TABS: 20.0000 mg | ORAL_TABLET | Freq: Every day | ORAL | Status: DC

## 2010-12-26 NOTE — Assessment & Plan Note (Signed)
con't meds Stop lisinopril

## 2010-12-26 NOTE — Patient Instructions (Signed)
Stop lisinopril.

## 2010-12-26 NOTE — Assessment & Plan Note (Signed)
Check labs con't meds---changed lantus to levemir Check glucose rto 3 months

## 2010-12-26 NOTE — Progress Notes (Signed)
  Subjective:    Patient ID: Jane Young, female    DOB: [birth year], 60 y.o.   MRN: [id number]  HPI HYPERTENSION Disease Monitoring Blood pressure range-115/70 Chest pain- no      Dyspnea- no Medications Compliance- good Lightheadedness- no   Edema- no   DIABETES Disease Monitoring Blood Sugar ranges-not check Polyuria- no New Visual problems- no---optho not going Medications Compliance- good Hypoglycemic symptoms- no   HYPERLIPIDEMIA Disease Monitoring See symptoms for Hypertension Medications Compliance- good RUQ pain- no  Muscle aches- no  ROS See HPI above   PMH Smoking Status noted    Review of Systems     Objective:   Physical Exam  Constitutional: She is oriented to person, place, and time. She appears well-developed and well-nourished.  Neck: Normal range of motion. Neck supple. No thyromegaly present.  Cardiovascular: Normal rate, regular rhythm and normal heart sounds.   Pulmonary/Chest: Effort normal and breath sounds normal.  Abdominal: Soft. Bowel sounds are normal.  Musculoskeletal: She exhibits no edema and no tenderness.  Neurological: She is alert and oriented to person, place, and time.  Psychiatric: She has a normal mood and affect. Her behavior is normal.   Sensory exam of the foot is normal, tested with the monofilament. Good pulses, no lesions or ulcers, good peripheral pulses.      Assessment & Plan:

## 2010-12-26 NOTE — Assessment & Plan Note (Signed)
con't meds---changed to lipitor con't diet and exercise

## 2010-12-26 NOTE — Progress Notes (Deleted)
  Subjective:    Jane Young is a 60 y.o. female here for follow up of dyslipidemia. The patient does not use medications that may worsen dyslipidemias (corticosteroids, progestins, anabolic steroids, diuretics, beta-blockers, amiodarone, cyclosporine, olanzapine). 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   Cardiac Risk Factors Age > 45-female, > 55-female:  YES  +1  Smoking:   NO  Sig. family hx of CHD*:  NO  Hypertension:   YES  +1  Diabetes:   yes  HDL < 35:   NO  HDL > 59:   NO  Total: 3   *- Sig. family h/o CHD per NCEP = MI or sudden death at <55yo in  father or other 1st-degree female relative, or <65yo in mother or  other 1st-degree female relative  The following portions of the patient's history were reviewed and updated as appropriate: allergies, current medications, past family history, past medical history, past social history, past surgical history and problem list.  Review of Systems Pertinent items are noted in HPI.    Objective:    BP 136/80  Pulse 96  Wt 285 lb (129.275 kg) General appearance: alert, cooperative, appears stated age, no distress and moderately obese Neck: no adenopathy, no carotid bruit, no JVD, supple, symmetrical, trachea midline and thyroid not enlarged, symmetric, no tenderness/mass/nodules Lungs: clear to auscultation bilaterally Heart: regular rate and rhythm, S1, S2 normal, no murmur, click, rub or gallop Extremities: extremities normal, atraumatic, no cyanosis or edema Neurologic: Grossly normal  Lab Review Lab Results  Component Value Date   CHOL 122 06/27/2010   CHOL 129 10/04/2009   CHOL 123 05/15/2009   HDL 50.00 06/27/2010   HDL 66.60 10/04/2009   HDL 54.09 05/15/2009      Assessment:    Dyslipidemia as detailed above with 3 CHD risk factors using NCEP scheme above.  Target levels for LDL are: < 70 mg/dl ("very high" risk for CHD)  Explained to the patient the respective contributions of genetics, diet,  and exercise to lipid levels and the use of medication in severe cases which do not respond to lifestyle alteration. The patient's interest and motivation in making lifestyle changes seems good.    Plan:    The following changes are planned for the next 3 months, at which time the patient will return for repeat fasting lipids:  1. Dietary changes: Increase soluble fiber Plant sterols 2grams per day (e.g. Benecol): yes Reduce saturated fat, "trans" monounsaturated fatty acids, and cholesterol 2. Exercise changes:  con't current plan 3. Other treatment: cont norvasc, enalapril and metoprolol 4. Lipid-lowering medications: lipitor 20 mg  (Recommended by NCEP after 3-6 mos of dietary therapy & lifestyle modification,  except if CHD is present or LDL well above 190.) 5. Hormone replacement therapy (patient is a postmenopausal  woman): no 6. Screening for secondary causes of dyslipidemias: check labs 7. Lipid screening for relatives:  8. Follow up: 3 months.  Note: The majority of the visit was spent in counseling on the pathophysiology and treatment of dyslipidemias. The total face-to-face time was in excess of 30 minutes.

## 2010-12-27 ENCOUNTER — Other Ambulatory Visit: Payer: Self-pay | Admitting: Family Medicine

## 2010-12-27 ENCOUNTER — Other Ambulatory Visit: Payer: Self-pay

## 2010-12-27 MED ORDER — "PEN NEEDLES 3/16"" 31G X 5 MM MISC": 1.0000 | Status: DC

## 2010-12-27 NOTE — Telephone Encounter (Signed)
Requested pen needles.Marland KitchenMarland KitchenFaxed to pharm     KP

## 2010-12-30 NOTE — Progress Notes (Signed)
Quick Note:  Spoke with patient and she declined the Endo referral, stated she has seen one in the past and 3 dieticians and they could not come to a decision on proper medical care. (She saw Endo about 18 years ago) She stated wants to continue care with you because financial issues will also keep her from going to a specialist and she stated she will change her diet. She is currently taking Metformin and Levemir and stated she thinks coming off of the Actos made it jump so high. KP ______

## 2010-12-31 ENCOUNTER — Ambulatory Visit (INDEPENDENT_AMBULATORY_CARE_PROVIDER_SITE_OTHER): Payer: PRIVATE HEALTH INSURANCE | Admitting: Family Medicine

## 2010-12-31 ENCOUNTER — Encounter: Payer: Self-pay | Admitting: Family Medicine

## 2010-12-31 VITALS — BP 134/74 | HR 96 | Wt 285.0 lb

## 2010-12-31 DIAGNOSIS — IMO0001 Reserved for inherently not codable concepts without codable children: Secondary | ICD-10-CM

## 2010-12-31 DIAGNOSIS — E1165 Type 2 diabetes mellitus with hyperglycemia: Secondary | ICD-10-CM

## 2010-12-31 LAB — GLUCOSE, POCT (MANUAL RESULT ENTRY): POC Glucose: 301

## 2010-12-31 MED ORDER — INSULIN PEN NEEDLE 32G X 5 MM MISC: 1.8000 [IU] | Status: DC

## 2010-12-31 MED ORDER — LIRAGLUTIDE 18 MG/3ML ~~LOC~~ SOLN: SUBCUTANEOUS | Status: DC

## 2010-12-31 NOTE — Patient Instructions (Addendum)
Diabetes Meal Planning Guide The diabetes meal planning guide is a tool to help you plan your meals and snacks. It is important for people with diabetes to manage their blood sugar levels. Choosing the right foods and the right amounts throughout your day will help control your blood sugar. Eating right can even help you improve your blood pressure and reach or maintain a healthy weight. CARBOHYDRATE COUNTING MADE EASY When you eat carbohydrates, they turn to sugar (glucose). This raises your blood sugar level. Counting carbohydrates can help you control this level so you feel better. When you plan your meals by counting carbohydrates, you can have more flexibility in what you eat and balance your medicine with your food intake. Carbohydrate counting simply means adding up the total amount of carbohydrate grams (g) in your meals or snacks. Try to eat about the same amount at each meal. Foods with carbohydrates are listed below. Each portion below is 1 carbohydrate serving or 15 grams of carbohydrates. Ask your dietician how many grams of carbohydrates you should eat at each meal or snack. Grains and Starches 1 slice bread 1/2 English muffin or hotdog/hamburger bun 3/4 cup cold cereal (unsweetened) 1/3 cup cooked pasta or rice 1/2 cup starchy vegetables (corn, potatoes, peas, beans, winter squash) 1 tortilla (6 inches) 1/4 bagel 1 waffle or pancake (size of a CD) 1/2 cup cooked cereal 4 to 6 small crackers *Whole grain is recommended Fruit 1 cup fresh unsweetened berries, melon, papaya, pineapple 1 small fresh fruit 1/2 banana or mango 1/2 cup fruit juice (4 ounces unsweetened) 1/2 cup canned fruit in natural juice or water 2 tablespoons dried fruit 12 to 15 grapes or cherries Milk and Yogurt 1 cup fat-free or 1% milk 1 cup soy milk 6 ounces light yogurt with sugar-free sweetener 6 ounces low-fat soy yogurt 6 ounces plain yogurt Vegetables 1 cup raw or 1/2 cup cooked is counted as 0  carbohydrates or a "free" food. If you eat 3 or more servings at one meal, count them as 1 carbohydrate serving. Other Carbohydrates 3/4 ounces chips or pretzels 1/2 cup ice cream or frozen yogurt 1/4 cup sherbet or sorbet 2 inch square cake, no frosting 1 tablespoon honey, sugar, jam, jelly, or syrup 2 small cookies 3 squares of graham crackers 3 cups popcorn 6 crackers 1 cup broth-based soup Count 1 cup casserole or other mixed foods as 2 carbohydrate servings. Foods with less than 20 calories in a serving may be counted as 0 carbohydrates or a "free" food. You may want to purchase a book or computer software that lists the carbohydrate gram counts of different foods. In addition, the nutrition facts panel on the labels of the foods you eat are a good source of this information. The label will tell you how big the serving size is and the total number of carbohydrate grams you will be eating per serving. Divide this number by 15 to obtain the number of carbohydrate servings in a portion. Remember: 1 carbohydrate serving equals 15 grams of carbohydrate. SERVING SIZES Measuring foods and serving sizes helps you make sure you are getting the right amount of food. The list below tells how big or small some common serving sizes are.  1 ounce (oz) of cheese.................................4 stacked dice.   2 to 3 oz cooked meat..................................Deck of cards.   1 teaspoon (tsp)............................................Tip of little finger.   1 tablespoon (tbs).........................................Thumb.   2 tbs.............................................................Golf ball.    cup...........................................................Half of a fist.   1 cup............................................................A fist.    SAMPLE DIABETES MEAL PLAN Below is a sample meal plan that includes foods from the grain and starches, dairy, vegetable, fruit, and  meat groups. A dietician can individualize a meal plan to fit your calorie needs and tell you the number of servings needed from each food group. However, controlling the total amount of carbohydrates in your meal or snack is more important than making sure you include all of the food groups at every meal. You may interchange carbohydrate containing foods (dairy, starches, and fruits). The meal plan below is an example of a 2000 calorie diet using carbohydrate counting. This meal plan has 17 carbohydrate servings (carb choices). Breakfast 1 cup oatmeal (2 carb choices) 3/4 cup light yogurt (1 carb choice) 1 cup blueberries (1 carb choice) 1/4 cup almonds  Snack 1 large apple (2 carb choices) 1 low-fat string cheese stick  Lunch Chicken breast salad:  1 cup spinach   1/4 cup chopped tomatoes   2 oz chicken breast, sliced   2 tbs low-fat [place] dressing  12 whole-wheat crackers (2 carb choices) 12 to 15 grapes (1 carb choice) 1 cup low-fat milk (1 carb choice)  Snack 1 cup carrots 1/2 cup hummus (1 carb choice)  Dinner 3 oz broiled salmon 1 cup brown rice (3 carb choices)  Snack 1 1/2 cups steamed broccoli (1 carb choice) drizzled with 1 tsp olive oil and lemon juice 1 cup light pudding (2 carb choices)  DIABETES MEAL PLANNING WORKSHEET Your dietician can use this worksheet to help you decide how many servings of foods and what types of foods are right for you.  Breakfast Food Group and Servings Carb Choices Grain/Starches _______________________________________ Dairy ______________________________________________ Vegetable _______________________________________ Fruit _______________________________________________ Meat _______________________________________________ Fat _____________________________________________ Lunch Food Group and Servings Carb Choices Grain/Starches ________________________________________ Dairy _______________________________________________ Fruit  ________________________________________________ Meat ________________________________________________ Fat _____________________________________________ Dinner Food Group and Servings Carb Choices Grain/Starches ________________________________________ Dairy _______________________________________________ Fruit ________________________________________________ Meat ________________________________________________ Fat _____________________________________________ Snacks Food Group and Servings Carb Choices Grain/Starches ________________________________________ Dairy _______________________________________________ Vegetable ________________________________________ Fruit ________________________________________________ Meat ________________________________________________ Fat _____________________________________________ Daily Totals Starches _________________________ Vegetable __________________________ Fruit ______________________________ Dairy ______________________________ Meat ______________________________ Fat ________________________________  Document Released: 05/22/2005 Document Re-Released: 02/12/2010 [employer] Patient Information 2011 [place], [place].  Start Victoza 0.6 mg SQ every day for 1 week then increase to 1.2 mg daily---stay there for 2 weeks and call in, drop off or fax glucose readings.  We will adjust meds then if needed.

## 2010-12-31 NOTE — Progress Notes (Signed)
   Subjective:    Jane Young is a 60 y.o. female who presents for follow-up of Type 2 diabetes mellitus.  Current symptoms/problems include none and have been unchanged. Symptoms have been present for n/a .  Pt here to review labs.  Known diabetic complications: none Cardiovascular risk factors: diabetes mellitus, dyslipidemia, hypertension, obesity (BMI >= 30 kg/m2), sedentary lifestyle and noncompliance Current diabetic medications include oral agents (dual therapy): metformin and insulin injections: levemir.  Eye exam current (within one year): no Weight trend: fluctuating a bit Prior visit with dietician: yes - years ago Current diet: in general, an "unhealthy" diet Current exercise: none  Current monitoring regimen: none Home blood sugar records: not doing it Any episodes of hypoglycemia? unknown  Is She on ACE inhibitor or angiotensin II receptor blocker?  Yes  enalapril (Vasotec)   The following portions of the patient's history were reviewed and updated as appropriate: allergies, current medications, past family history, past medical history, past social history, past surgical history and problem list.  Review of Systems Pertinent items are noted in HPI.    Objective:    BP 134/74  Pulse 96  Wt 285 lb (129.275 kg)  General:  alert  Oropharynx:    Eyes:     Ears:    Neck:   Thyroid:    Lung:   Heart:    Abdomen:   Extremities:   Skin:   Pulses:   Neuro:    Psych  AAO X3  NAD, not anxious or depressed Lab Review Glucose, Bld (mg/dL)  Date Value  12/31/9561 191*  06/27/2010 75   10/04/2009 112*     CO2 (mEq/L)  Date Value  12/26/2010 24   06/27/2010 29   10/04/2009 29      BUN (mg/dL)  Date Value  8/75/6433 21   06/27/2010 20   10/04/2009 20      Creatinine, Ser (mg/dL)  Date Value  2/95/1884 0.9   06/27/2010 0.9   10/04/2009 0.9    glucose    Assessment:    Diabetes Mellitus type II, under poor control.    Plan:    1.  Rx changes:  victoza 2.  Education: Reviewed 'ABCs' of diabetes management (respective goals in parentheses):  A1C (<7), blood pressure (<130/80), and cholesterol (LDL <100). 3.  Compliance at present is estimated to be poor. Efforts to improve compliance (if necessary) will be directed at dietary modifications: ada diet, increased exercise and regular blood sugar monitoring: bid times daily. 4. Follow up: 3 months

## 2011-01-24 ENCOUNTER — Other Ambulatory Visit: Payer: Self-pay

## 2011-01-24 MED ORDER — GLUCOSE BLOOD VI STRP: ORAL_STRIP | Status: AC

## 2011-01-24 NOTE — Cardiovascular Report (Signed)
NAMECORRINE, Jane Young NO.:  [id number]   MEDICAL RECORD NO.:  [id number]                   PATIENT TYPE:  INP   LOCATION:  2907                                 FACILITY:  [facility]   PHYSICIAN:  Learta Codding, M.D. [facility]             DATE OF BIRTH:  1951/06/12   DATE OF PROCEDURE:  01/01/2004  DATE OF DISCHARGE:                              CARDIAC CATHETERIZATION   REFERRING PHYSICIAN:  Angelena Sole, M.D. [facility]   CARDIOLOGIST:  Veneda Melter, M.D.   PROCEDURE PERFORMED:  1. Left heart catheterization with selective coronary angiography.  2. Ventriculography.   DIAGNOSES:  No evidence of flow limiting coronary artery disease.   INDICATIONS:  The patient is a 60 year old female with multiple cardiac risk  factors admitted with atypical pain.  The patient has ruled out for  myocardial infarction.  She is now being referred for cardiac  catheterization to assess her coronary anatomy.   DESCRIPTION OF PROCEDURE:  After informed consent was obtained, the patient  was brought to the catheterization laboratory. A #6 French arterial sheath  was placed in the right femoral artery.  Angiographic images were done with  a #6 [place] JL4 and JR4 catheterizations.  A #6 French angle pigtail  catheter was used for ventriculography.  No complications were encountered  during the procedure.  After termination of the procedure, all catheters  were removed, and the patient was brought back to the holding area.  The  patient did receive 20 mg hydralazine for blood pressure control, orders  have been written to receive labetalol if she remains hypertensive prior to  sheath removal.   FINDINGS:  1. Hemodynamics:  Left ventricular pressure was 190/11 mmHg. Aortic pressure     190/108 mmHg.  2. Ventriculography: Ejection fraction 70%.  No wall motion abnormalities.     No mitral regurgitation.   SELECTIVE CORONARY ANGIOGRAPHY:  1. The coronary circulation was left dominant.  2. The left main coronary artery was a large caliber vessel with no flow     limiting disease.  3. The left anterior descending artery was a large caliber vessel wrapping     around the apex, with several large diagonal branches.  The first     diagonal branch had a 30% proximal stenosis.  4. The circumflex coronary has no evidence of flow-limiting disease.  5. The right coronary artery was nondominant.  There was slight damping upon     injection.  There was no evidence of flow-limiting disease.  6. Distal aortogram with no evidence of renal artery stenosis.  The aorta     demonstrated no evidence of a suprarenal aneurysm.   RECOMMENDATIONS:  No evidence of significant angiographic epicardial  coronary artery disease.  Certainly, this does not explain the patient's  chest pain syndrome.  Further evaluation as per Internal Medicine.  The  patient needs control of blood pressure and adjustments in  her medications  were made.                                               Learta Codding, M.D. [facility]    GED/MEDQ  D:  01/01/2004  T:  01/02/2004  Job:  [id number]   cc:   Angelena Sole, M.D. [facility]   Veneda Melter, M.D.

## 2011-01-24 NOTE — H&P (Signed)
NAMEMAHAGONY, GRIEB NO.:  [id number]   MEDICAL RECORD NO.:  [id number]                   PATIENT TYPE:  INP   LOCATION:  2907                                 FACILITY:  [facility]   PHYSICIAN:  Angelena Sole, M.D. [facility]              DATE OF BIRTH:  1950-12-02   DATE OF ADMISSION:  12/29/2003  DATE OF DISCHARGE:                                HISTORY & PHYSICAL   CHIEF COMPLAINT:  Heart racing.   HISTORY OF PRESENT ILLNESS:  The patient is a 60 year old white female with  diabetes, hypertension, hyperlipidemia who for the past one week has been  getting twinges in her substernal area.  She will take some aspirin which  has not helped and 30 minutes later takes antacids, has some belching, seems  to help.  She will continue belching for a day or two, then she was okay for  a couple of days.  Episode reoccurred and both times were after eating at  [facility] and then the leftovers, so she thought maybe it was related to the  food and then she continued to have other episodes during the week.  Then,  at lunch today, her heart began to race.  Blood pressure was 160/76 in the  left, 188/90 in the right, and then she kept checking it and it kept  increasing.  When it got to 200/100, she took two aspirin and two antacids.  Things continued to increase and she was feeling worse, so she picked up her  children from school early as her husband is out of town and then she came  by the office as a walk-in.  Normally, her blood pressures run 145-160/76-  85.   PAST MEDICAL HISTORY:  1. Diabetes.  2. Hypertension.  3. Hyperlipidemia.   PAST SURGICAL HISTORY:  1. T&A.  2. C-section.  3. Left ovarian cyst.   ALLERGIES:  No known drug allergies.   MEDICATIONS:  1. HCTZ 25 mg.  2. Aspirin 325 mg.  3. Multivitamin.  4. Vitamin E.  5. Vasotec 10 mg.  6. Glucophage 1000 mg b.i.d.  7. Fish oil 2000 mg.  8. Lantus 40 units.  9. Actos 15 mg.  10.      Advicor 20/500  two daily.   SOCIAL HISTORY:  The patient is married, two daughters, does not smoke.   REVIEW OF SYSTEMS:  HEENT, respiratory, GI, GU, musculoskeletal, neurologic  are all negative except as in HPI.   PHYSICAL EXAMINATION:  VITAL SIGNS:  Weight 249, pulse 120, respirations 18,  initial blood pressure 248/136.  GENERAL:  The patient is a morbidly obese white female who is diaphoretic  with a very red face.  While doing the EKG, I repeated the blood pressure.  It was 220/110 with a pulse of 120.  We obtained the EKG, gave her clonidine  0.1 mg.  HEENT:  Normocephalic, atraumatic.  Pupils  equally round and reactive to  light.  NECK:  Supple.  No JVD, no carotid bruits, no thyromegaly, no  lymphadenopathy.  HEART:  Tachycardic.  S1, S2 positive.  Questionable 1/6 systolic murmur.  LUNGS:  Clear to auscultation and percussion bilaterally.  ABDOMEN:  Bloated, hyperactive bowel sounds, nontender, nondistended, no  hepatosplenomegaly, no masses.  EXTREMITIES:  No edema.  Dorsalis pedis and radial pulses were 2+  bilaterally.  SKIN:  Diaphoretic with a very red face.   EKG showed sinus tachycardia and difficult to interpret any ST-T wave  changes secondary to heart rate.  The patient was given the clonidine 0.1 mg  and aspirin 325 mg.  EMS was called.  Repeat blood pressure about 15 minutes  later was 200/98.  Discussed with cardiology.  Gave the patient Toprol 50 mg  and sent her off with EMS.   IMPRESSION:  1. Tachycardia with significantly elevated blood pressures.  We will admit     the patient to [facility] telemetry to rule out myocardial infarction and     other causes of tachycardia.  2. Chest pain.  Questionable secondary to gastrointestinal versus cardiac.     We will check lab studies, place the patient on Protonix, care per     cardiology.  They will see the patient upon arrival.  3. Diabetes, uncontrolled.  The patient has been uncontrolled for a while,     has missed her  followup labs in February, etc.  We will continue her on     Lantus, hold the Glucophage in case any dye studies need to be done.  We     will also hold Actos at this time and place her on a sliding scale.  4. Hyperlipidemia.  On Advicor, no complaints from that but has had no     followup on her labs.  We will check a fasting lipid profile in the     morning.  5. Hypertension.  Continue the Vasotec and care per cardiology.                                                Angelena Sole, M.D. [facility]    AMK/MEDQ  D:  12/30/2003  T:  12/31/2003  Job:  [id number]

## 2011-01-24 NOTE — Discharge Summary (Signed)
NAMEJOHNITA, PALLESCHI NO.:  [id number]   MEDICAL RECORD NO.:  [id number]                   PATIENT TYPE:  INP   LOCATION:  3735                                 FACILITY:  [facility]   PHYSICIAN:  Rene Paci, M.D. [facility]          DATE OF BIRTH:  12-16-1950   DATE OF ADMISSION:  12/29/2003  DATE OF DISCHARGE:  01/02/2004                                 DISCHARGE SUMMARY   DISCHARGE DIAGNOSES:  1. Heart palpitations.  2. Chest pain.  3. Uncontrolled diabetes.  4. Hyperlipidemia.  5. Hypertension.  6. Mild coronary artery disease.   BRIEF ADMISSION HISTORY:  Ms. Althouse is a 60 year old white female who  presented to the office with a one week history of twinges in her  substernal area. She has noted marginal relief with antacids. The patient  has had some belching.   PAST MEDICAL HISTORY:  1. Adult onset diabetes mellitus x10 years, currently insulin requiring.  2. Hypertension.  3. Hyperlipidemia.  4. Status post T&A.  5. Cesarean section.  6. Left ovarian cyst.  7. Obesity.   HOSPITAL COURSE:  Problem 1.  Cardiovascular.  The patient was admitted to  rule out myocardial infarction. Serial cardiac enzymes were negative.  However, she had numerous risk factors for coronary artery disease. It was  felt she would benefit from cardiac catheterization. On January 01, 2004 the  patient underwent cardiac catheterization. The first diagonal revealed a 30%  proximal lesion, it was preserved. Dr. Urban Gibson conclusion  was that the  patient had mild coronary artery disease and would need to continue  aggressive medical treatment with lifestyle modification.   Problem 2.  Hypertension.  The patient's antihypertensives were increased.   Problem 3.  Hyperlipidemia. This is a significant problem for the patient.  The patient's cholesterol is markedly elevated at 265. Triglycerides were  550. LDL was unable to calculate secondary to her significantly elevated  triglycerides. The patient was on Advicor prior to admission, which is a  combination of niacin and Lovastatin. This dose has been increased. The  patient has also been urged to follow a laparoscopic cholecystectomy diet.   Problem 4.  Adult onset diabetes mellitus. The patient's hemoglobin A1C was  elevated at 9.2% indicating poor outpatient glycemic control. The patient  states that she was diagnosed with diabetes 10 years ago and that was the  last time she had any diabetic education. She is not sure what a diabetic  diet is. The patient also states she does not often check her blood sugars  at home. We did not make any adjustments in the patient's diabetic regimen  as the patient's blood sugars have been well-controlled in the hospital.  We  have urged her to follow a diabetic diet and we will arrange outpatient  [facility].   DISCHARGE LABS:  CBC was normal. CMET was normal except for an elevated  glucose. Hemoglobin A1C was 9.2%. Cholesterol 265, triglycerides 550, HDL  40, LDL not calculated. TSH 2.149, free T4 1.03.   DISCHARGE MEDICATIONS:  1. Aspirin 325 mg daily.  2. HCTZ 25 mg daily.  3. Advicor 20/500 two tabs.  4. Lantus 40 units daily.  5. Lopressor 75 mg daily.  6. Norvasc 10 mg daily.  7. Vasotec 10 mg b.i.d.  8. Glucophage 1 g b.i.d. to be resumed on January 04, 2004.  9. Actos 15 mg daily.   DISCHARGE FOLLOW UP:  Follow-up with Dr. Ruthine Dose in one to two weeks.      Cornell Barman, P.A. [facility]                  Rene Paci, M.D. [facility]    LC/MEDQ  D:  01/02/2004  T:  01/02/2004  Job:  [id number]   cc:   Angelena Sole, M.D. [facility]   Veneda Melter, M.D.

## 2011-01-24 NOTE — Consult Note (Signed)
NAMEALICHIA, Jane Young NO.:  [id number]   MEDICAL RECORD NO.:  [id number]                   PATIENT TYPE:  EMS   LOCATION:  [facility]                                 FACILITY:  [facility]   PHYSICIAN:  Veneda Melter, M.D.                   DATE OF BIRTH:  08/13/1951   DATE OF CONSULTATION:  12/29/2003  DATE OF DISCHARGE:                                   CONSULTATION   CHIEF COMPLAINT:  Palpitations.   HISTORY:  Jane Young is a 60 year old white female with dyslipidemia,  hypertension, diabetes mellitus, who presents for assessment of  palpitations.  The patient has been in her usual health for the last one  week.  She had noted some mild mid sternal chest discomfort.  This was  associated with significant belching.  She took much antiacids. Over the  course of two days, this was relieved.  Today after lunch, she felt  significant palpitations, and her heart was racing.  She did not have any  chest discomfort or shortness of breath.  She checked her blood pressure at  home, and this was elevated.  She subsequently saw Dr. Angelena Sole who  recommended admission.  The patient currently denies any chest discomfort or  shortness of breath.  She has not had any exertional symptoms.  She denies  any orthopnea or paroxysmal dyspnea, fevers, chills, cough or other  constitutional symptoms.  She has not had any nausea or vomiting.  She  denies diaphoresis.  It was felt that she was somewhat diaphoretic in the  office.   REVIEW OF SYSTEMS:  Otherwise noncontributory.   PAST MEDICAL HISTORY:  Notable for diabetes mellitus x10 years,  hypertension, dyslipidemia, resection of left ovarian cyst.  She is status  post tonsillectomy.  She has had two children, one via vaginal delivery, one  with cesarean section.   ALLERGIES:  NONE.   MEDICATIONS:  1. Hydrochlorothiazide 25 mg per day.  2. Aspirin 325 daily.  3. Multivitamin daily.  4. Vitamin E, 400 units daily.  5.  Vasotec 10 mg daily.  6. __________1000 mg b.i.d.  7. Fish oil 2 grams daily.  8. Lantus insulin 40 units daily.  9. Actos 50 mg daily.  10.      Advicor 20/500 two daily.   SOCIAL HISTORY:  The patient lives in [place] with her husband.  She has  two children, ages 71 and 60.  She has not been working since 2000.  She  denies a history of tobacco or alcohol use.   FAMILY HISTORY:  Mother is alive at age of 32 with hypertension and  dyslipidemia.  Father is alive at age 87 with diabetes mellitus.  Two  sisters, age 75 and 43 without medical problems.   PHYSICAL EXAMINATION:  GENERAL:  She is an overweight white female, in no  acute distress.  She is afebrile.  VITAL SIGNS:  Pulse 108 and regular.  Respirations 15, blood pressure  196/105.  O2 saturations are 98% on room air.  Pupils are equal, round and  reactive to light.  Extraocular muscles are intact.  Oropharynx shows no  lesion.  NECK:  Supple, no adenopathy, no bruits.  CARDIAC:  Heart exam is regular without murmurs, positive S4.  LUNGS:  Clear to auscultation.  ABDOMEN:  Protuberant, nontender.  EXTREMITIES:  No edema.  Peripheral pulses are 2+ and equal bilaterally.   LABORATORY DATA:  ECG shows sinus tachycardia 122, with nonspecific T wave  changes.  No acute ST segment shifts.   White count 7.4, hemoglobin 14.6, hematocrit 42.2, platelets 230,000.   ASSESSMENT/PLAN:  Jane Young is a 60 year old white female with metabolic  syndrome with a 10-year history of diabetes mellitus who presents with vague  chest discomfort for one week.  Now with significant palpitations.  Tachycardia and hypertension.  The patient is being admitted to the hospital  for observation.  We agree with telemetry monitoring.  We will plan on  ruling out acute infarction with serial enzymes, although it is possible the  patient may have had an episode a week ago triggering her current  symptomatology.  Other considerations include pulmonary  embolus, thyroid  disorder, or valvular dysfunction.  We will plan on advancing her  antianginals and antihypertensives for control of hemodynamics.  I believe  that further assessment of her coronary perfusion will be necessary.  I  discussed with risk factors and alternatives of stress imaging study versus  cardiac catheterization.  I believe that given the fact that she is a female  with a long history of diabetes and moderate obesity, the sensitivity and  specificity of stress imaging study would be low.  I would thus favor  cardiac catheterization to define her coronary anatomy and guide further  therapy.  The risks, benefits and alternatives of which, and possibility for  vascularization were discussed with the patient who understands and agrees  to proceed.  We will also check blood work including D-Dimer and thyroid  function tests, and make further recommendations based upon her clinical  course.                                               Veneda Melter, M.D.    [name]  D:  12/29/2003  T:  12/30/2003  Job:  [id number]   cc:   Angelena Sole, M.D. [facility]

## 2011-06-24 ENCOUNTER — Other Ambulatory Visit: Payer: Self-pay | Admitting: Family Medicine

## 2011-06-24 NOTE — Telephone Encounter (Signed)
Rx faxed for 30 days only--Labs due    KP

## 2011-07-18 ENCOUNTER — Other Ambulatory Visit: Payer: Self-pay | Admitting: Family Medicine

## 2011-08-01 ENCOUNTER — Other Ambulatory Visit: Payer: Self-pay | Admitting: Family Medicine

## 2011-08-05 ENCOUNTER — Other Ambulatory Visit: Payer: Self-pay

## 2011-08-05 DIAGNOSIS — E785 Hyperlipidemia, unspecified: Secondary | ICD-10-CM

## 2011-08-05 MED ORDER — AMLODIPINE BESYLATE 10 MG PO TABS: 10.0000 mg | ORAL_TABLET | Freq: Every day | ORAL | Status: DC

## 2011-08-05 MED ORDER — METOPROLOL TARTRATE 25 MG PO TABS: 25.0000 mg | ORAL_TABLET | Freq: Three times a day (TID) | ORAL | Status: DC

## 2011-08-05 MED ORDER — LIRAGLUTIDE 18 MG/3ML ~~LOC~~ SOLN: 1.2000 mg | Freq: Every day | SUBCUTANEOUS | Status: DC

## 2011-08-05 MED ORDER — METFORMIN HCL 1000 MG PO TABS: 1000.0000 mg | ORAL_TABLET | Freq: Two times a day (BID) | ORAL | Status: DC

## 2011-08-05 MED ORDER — FUROSEMIDE 20 MG PO TABS: 20.0000 mg | ORAL_TABLET | Freq: Every day | ORAL | Status: DC

## 2011-08-05 MED ORDER — ENALAPRIL MALEATE 10 MG PO TABS: 10.0000 mg | ORAL_TABLET | Freq: Two times a day (BID) | ORAL | Status: DC

## 2011-09-08 ENCOUNTER — Other Ambulatory Visit: Payer: Self-pay | Admitting: Family Medicine

## 2011-09-15 ENCOUNTER — Telehealth: Payer: Self-pay | Admitting: Family Medicine

## 2011-09-15 DIAGNOSIS — I10 Essential (primary) hypertension: Secondary | ICD-10-CM

## 2011-09-15 DIAGNOSIS — E785 Hyperlipidemia, unspecified: Secondary | ICD-10-CM

## 2011-09-15 DIAGNOSIS — E119 Type 2 diabetes mellitus without complications: Secondary | ICD-10-CM

## 2011-09-15 NOTE — Telephone Encounter (Signed)
Patient has upcoming lab appt in February. Please order labs

## 2011-09-15 NOTE — Telephone Encounter (Signed)
Yes please and Ua

## 2011-09-15 NOTE — Telephone Encounter (Signed)
Future order put in the system    KP

## 2011-09-15 NOTE — Telephone Encounter (Signed)
Dr.Lowne this patient is due for labs- would you like for her to have the lipid hep bmp A1c and microalbumin. Please advise   KP

## 2011-09-17 ENCOUNTER — Encounter: Payer: PRIVATE HEALTH INSURANCE | Admitting: Family Medicine

## 2011-09-22 ENCOUNTER — Ambulatory Visit: Payer: PRIVATE HEALTH INSURANCE | Admitting: Family Medicine

## 2011-10-07 ENCOUNTER — Other Ambulatory Visit: Payer: Self-pay | Admitting: Family Medicine

## 2011-10-21 ENCOUNTER — Other Ambulatory Visit (INDEPENDENT_AMBULATORY_CARE_PROVIDER_SITE_OTHER): Payer: BC Managed Care – PPO

## 2011-10-21 DIAGNOSIS — E785 Hyperlipidemia, unspecified: Secondary | ICD-10-CM

## 2011-10-21 DIAGNOSIS — E119 Type 2 diabetes mellitus without complications: Secondary | ICD-10-CM

## 2011-10-21 DIAGNOSIS — I10 Essential (primary) hypertension: Secondary | ICD-10-CM

## 2011-10-21 LAB — BASIC METABOLIC PANEL
Calcium: 10.1 mg/dL (ref 8.4–10.5)
Chloride: 101 mEq/L (ref 96–112)
Creatinine, Ser: 0.8 mg/dL (ref 0.4–1.2)
Sodium: 139 mEq/L (ref 135–145)

## 2011-10-21 LAB — HEPATIC FUNCTION PANEL
Albumin: 4.3 g/dL (ref 3.5–5.2)
Alkaline Phosphatase: 67 U/L (ref 39–117)
Bilirubin, Direct: 0.1 mg/dL (ref 0.0–0.3)
Total Protein: 7.6 g/dL (ref 6.0–8.3)

## 2011-10-21 LAB — MICROALBUMIN / CREATININE URINE RATIO
Creatinine,U: 53.7 mg/dL
Microalb Creat Ratio: 1.9 mg/g (ref 0.0–30.0)

## 2011-10-21 LAB — LIPID PANEL
Cholesterol: 182 mg/dL (ref 0–200)
LDL Cholesterol: 93 mg/dL (ref 0–99)
Total CHOL/HDL Ratio: 3
VLDL: 33.6 mg/dL (ref 0.0–40.0)

## 2011-10-21 LAB — HEMOGLOBIN A1C: Hgb A1c MFr Bld: 8.7 % — ABNORMAL HIGH (ref 4.6–6.5)

## 2011-10-28 ENCOUNTER — Ambulatory Visit (INDEPENDENT_AMBULATORY_CARE_PROVIDER_SITE_OTHER): Payer: BC Managed Care – PPO | Admitting: Family Medicine

## 2011-10-28 ENCOUNTER — Encounter: Payer: Self-pay | Admitting: Family Medicine

## 2011-10-28 VITALS — BP 134/84 | HR 88 | Temp 98.5°F | Wt 260.0 lb

## 2011-10-28 DIAGNOSIS — Z23 Encounter for immunization: Secondary | ICD-10-CM

## 2011-10-28 DIAGNOSIS — E785 Hyperlipidemia, unspecified: Secondary | ICD-10-CM

## 2011-10-28 DIAGNOSIS — E119 Type 2 diabetes mellitus without complications: Secondary | ICD-10-CM

## 2011-10-28 DIAGNOSIS — I10 Essential (primary) hypertension: Secondary | ICD-10-CM

## 2011-10-28 MED ORDER — METOPROLOL TARTRATE 25 MG PO TABS: 25.0000 mg | ORAL_TABLET | Freq: Three times a day (TID) | ORAL | Status: DC

## 2011-10-28 MED ORDER — AMLODIPINE BESYLATE 10 MG PO TABS: 10.0000 mg | ORAL_TABLET | Freq: Every day | ORAL | Status: DC

## 2011-10-28 MED ORDER — METFORMIN HCL 1000 MG PO TABS: 1000.0000 mg | ORAL_TABLET | Freq: Two times a day (BID) | ORAL | Status: DC

## 2011-10-28 MED ORDER — ENALAPRIL MALEATE 10 MG PO TABS: 10.0000 mg | ORAL_TABLET | Freq: Two times a day (BID) | ORAL | Status: DC

## 2011-10-28 MED ORDER — ATORVASTATIN CALCIUM 20 MG PO TABS: 20.0000 mg | ORAL_TABLET | Freq: Every day | ORAL | Status: DC

## 2011-10-28 MED ORDER — LIRAGLUTIDE 18 MG/3ML ~~LOC~~ SOLN: 1.8000 mg | Freq: Every day | SUBCUTANEOUS | Status: DC

## 2011-10-28 MED ORDER — FUROSEMIDE 20 MG PO TABS: 20.0000 mg | ORAL_TABLET | Freq: Every day | ORAL | Status: DC

## 2011-10-28 MED ORDER — INSULIN DETEMIR 100 UNIT/ML ~~LOC~~ SOLN: 40.0000 [IU] | Freq: Every day | SUBCUTANEOUS | Status: DC

## 2011-10-28 NOTE — Assessment & Plan Note (Signed)
Uncontrolled Discussed diet and exercise Reviewed labs---inc Victoza 1.8

## 2011-10-28 NOTE — Assessment & Plan Note (Signed)
Review labs con't meds

## 2011-10-28 NOTE — Progress Notes (Signed)
  Subjective:    Patient ID: Jane Young, female    DOB: 12-08-1950, 61 y.o.   MRN: [id number]  HPI Pt here for f/u and review labs.  HYPERTENSION Disease Monitoring Blood pressure range-not checking  Chest pain- no      Dyspnea- no Medications Compliance- good Lightheadedness- no   Edema- yes   DIABETES Disease Monitoring Blood Sugar ranges-not checking  Polyuria- no New Visual problems- no Medications Compliance- good Hypoglycemic symptoms- no   HYPERLIPIDEMIA Disease Monitoring See symptoms for Hypertension Medications Compliance- good RUQ pain- no  Muscle aches- no  ROS See HPI above   PMH Smoking Status noted     Review of Systems As above    Objective:   Physical Exam  Constitutional: She is oriented to person, place, and time. She appears well-developed and well-nourished.  Neck: Normal range of motion. Neck supple.  Cardiovascular: Normal rate and regular rhythm.   Murmur heard. Pulmonary/Chest: Effort normal and breath sounds normal. No respiratory distress. She has no wheezes. She has no rales. She exhibits no tenderness.  Neurological: She is alert and oriented to person, place, and time.  Psychiatric: She has a normal mood and affect. Her behavior is normal. Judgment and thought content normal.          Assessment & Plan:

## 2011-10-28 NOTE — Assessment & Plan Note (Addendum)
con't meds stable 

## 2011-10-28 NOTE — Patient Instructions (Signed)
Diabetes and Exercise Regular exercise is important and can help:   Control blood glucose (sugar).   Decrease blood pressure.    Control blood lipids (cholesterol, triglycerides).   Improve overall health.  BENEFITS FROM EXERCISE  Improved fitness.   Improved flexibility.   Improved endurance.   Increased bone density.   Weight control.   Increased muscle strength.   Decreased body fat.   Improvement of the body's use of insulin, a hormone.   Increased insulin sensitivity.   Reduction of insulin needs.   Reduced stress and tension.   Helps you feel better.  People with diabetes who add exercise to their lifestyle gain additional benefits, including:  Weight loss.   Reduced appetite.   Improvement of the body's use of blood glucose.   Decreased risk factors for heart disease:   Lowering of cholesterol and triglycerides.   Raising the level of good cholesterol (high-density lipoproteins, HDL).   Lowering blood sugar.   Decreased blood pressure.  TYPE 1 DIABETES AND EXERCISE  Exercise will usually lower your blood glucose.   If blood glucose is greater than 240 mg/dl, check urine ketones. If ketones are present, do not exercise.   Location of the insulin injection sites may need to be adjusted with exercise. Avoid injecting insulin into areas of the body that will be exercised. For example, avoid injecting insulin into:   The arms when playing tennis.   The legs when jogging. For more information, discuss this with your caregiver.   Keep a record of:   Food intake.   Type and amount of exercise.   Expected peak times of insulin action.   Blood glucose levels.  Do this before, during, and after exercise. Review your records with your caregiver. This will help you to develop guidelines for adjusting food intake and insulin amounts.  TYPE 2 DIABETES AND EXERCISE  Regular physical activity can help control blood glucose.   Exercise is important  because it may:   Increase the body's sensitivity to insulin.   Improve blood glucose control.   Exercise reduces the risk of heart disease. It decreases serum cholesterol and triglycerides. It also lowers blood pressure.   Those who take insulin or oral hypoglycemic agents should watch for signs of hypoglycemia. These signs include dizziness, shaking, sweating, chills, and confusion.   Body water is lost during exercise. It must be replaced. This will help to avoid loss of body fluids (dehydration) or heat stroke.  Be sure to talk to your caregiver before starting an exercise program to make sure it is safe for you. Remember, any activity is better than none.  Document Released: 11/15/2003 Document Revised: 05/07/2011 Document Reviewed: 03/01/2009 [facility] Patient Information 2012 [place], [place].  Cholesterol Cholesterol is a white, waxy, fat-like protein needed by your body in small amounts. The liver makes all the cholesterol you need. It is carried from the liver by the blood through the blood vessels. Deposits (plaque) may build up on blood vessel walls. This makes the arteries narrower and stiffer. Plaque increases the risk for heart attack and stroke. You cannot feel your cholesterol level even if it is very high. The only way to know is by a blood test to check your lipid (fats) levels. Once you know your cholesterol levels, you should keep a record of the test results. Work with your caregiver to to keep your levels in the desired range. WHAT THE RESULTS MEAN:  Total cholesterol is a rough measure of all the cholesterol  in your blood.   LDL is the so-called bad cholesterol. This is the type that deposits cholesterol in the walls of the arteries. You want this level to be low.   HDL is the good cholesterol because it cleans the arteries and carries the LDL away. You want this level to be high.   Triglycerides are fat that the body can either burn for energy or store. High levels  are closely linked to heart disease.  DESIRED LEVELS:  Total cholesterol below 200.   LDL below 100 for people at risk, below 70 for very high risk.   HDL above 50 is good, above 60 is best.   Triglycerides below 150.  HOW TO LOWER YOUR CHOLESTEROL:  Diet.   Choose fish or white meat chicken and [place], roasted or baked. Limit fatty cuts of red meat, fried foods, and processed meats, such as sausage and lunch meat.   Eat lots of fresh fruits and vegetables. Choose whole grains, beans, pasta, potatoes and cereals.   Use only small amounts of olive, corn or canola oils. Avoid butter, mayonnaise, shortening or palm kernel oils. Avoid foods with trans-fats.   Use skim/nonfat milk and low-fat/nonfat yogurt and cheeses. Avoid whole milk, cream, ice cream, egg yolks and cheeses. Healthy desserts include angel food cake, ginger snaps, animal crackers, hard candy, popsicles, and low-fat/nonfat frozen yogurt. Avoid pastries, cakes, pies and cookies.   Exercise.   A regular program helps decrease LDL and raises HDL.   Helps with weight control.   Do things that increase your activity level like gardening, walking, or taking the stairs.   Medication.   May be prescribed by your caregiver to help lowering cholesterol and the risk for heart disease.   You may need medicine even if your levels are normal if you have several risk factors.  HOME CARE INSTRUCTIONS   Follow your diet and exercise programs as suggested by your caregiver.   Take medications as directed.   Have blood work done when your caregiver feels it is necessary.  MAKE SURE YOU:   Understand these instructions.   Will watch your condition.   Will get help right away if you are not doing well or get worse.  Document Released: 05/20/2001 Document Revised: 05/07/2011 Document Reviewed: 11/10/2007 [facility] Patient Information 2012 [place], [place].

## 2012-01-01 ENCOUNTER — Other Ambulatory Visit: Payer: Self-pay | Admitting: Family Medicine

## 2012-02-01 ENCOUNTER — Other Ambulatory Visit: Payer: Self-pay | Admitting: Family Medicine

## 2012-02-12 ENCOUNTER — Other Ambulatory Visit: Payer: Self-pay | Admitting: Family Medicine

## 2012-03-09 ENCOUNTER — Other Ambulatory Visit: Payer: Self-pay | Admitting: Family Medicine

## 2012-04-15 ENCOUNTER — Other Ambulatory Visit: Payer: Self-pay | Admitting: Family Medicine

## 2012-04-22 ENCOUNTER — Other Ambulatory Visit: Payer: Self-pay | Admitting: Family Medicine

## 2012-04-27 ENCOUNTER — Other Ambulatory Visit (INDEPENDENT_AMBULATORY_CARE_PROVIDER_SITE_OTHER): Payer: BC Managed Care – PPO

## 2012-04-27 DIAGNOSIS — E785 Hyperlipidemia, unspecified: Secondary | ICD-10-CM

## 2012-04-27 DIAGNOSIS — I10 Essential (primary) hypertension: Secondary | ICD-10-CM

## 2012-04-27 DIAGNOSIS — E119 Type 2 diabetes mellitus without complications: Secondary | ICD-10-CM

## 2012-04-27 LAB — MICROALBUMIN / CREATININE URINE RATIO
Creatinine,U: 215.7 mg/dL
Microalb Creat Ratio: 2.2 mg/g (ref 0.0–30.0)
Microalb, Ur: 4.8 mg/dL — ABNORMAL HIGH (ref 0.0–1.9)

## 2012-04-27 LAB — HEPATIC FUNCTION PANEL
ALT: 31 U/L (ref 0–35)
Albumin: 4 g/dL (ref 3.5–5.2)
Total Protein: 7.1 g/dL (ref 6.0–8.3)

## 2012-04-27 LAB — BASIC METABOLIC PANEL
GFR: 70.75 mL/min (ref >60.00)
Potassium: 3.5 mEq/L (ref 3.5–5.1)
Sodium: 139 mEq/L (ref 135–145)

## 2012-04-27 LAB — HEMOGLOBIN A1C: Hgb A1c MFr Bld: 8.1 % — ABNORMAL HIGH (ref 4.6–6.5)

## 2012-04-27 LAB — LIPID PANEL
Cholesterol: 150 mg/dL (ref 0–200)
HDL: 55.8 mg/dL (ref >39.00)
LDL Cholesterol: 56 mg/dL (ref 0–99)
VLDL: 38 mg/dL (ref 0.0–40.0)

## 2012-04-27 NOTE — Progress Notes (Signed)
Lab only 

## 2012-04-28 ENCOUNTER — Other Ambulatory Visit: Payer: Self-pay | Admitting: Family Medicine

## 2012-05-02 ENCOUNTER — Other Ambulatory Visit: Payer: Self-pay | Admitting: Family Medicine

## 2012-05-03 NOTE — Telephone Encounter (Signed)
Please offer this patient and apt Dr.Lowne would like to see her in the office tor review labs     KP

## 2012-05-03 NOTE — Telephone Encounter (Signed)
Patient only has one number listed and when you call there is no answer, there is no machine, pt spouse has the same phone number, patient notes say she has no email - do you want me to mail her a letter

## 2012-05-04 NOTE — Telephone Encounter (Signed)
Called pt finally got an answer, she stated she wants to know why she needs to come in before she schedules an appt as she has never had to do that before. Please call at [phone]

## 2012-05-05 NOTE — Telephone Encounter (Signed)
Spoke wt/pt this morning, she stated that she would be unable to maker her f/u appt right now as she has just started 2-children in college an has $8.00 to her name. She stated she would call back when she could afford.

## 2012-05-17 ENCOUNTER — Other Ambulatory Visit: Payer: Self-pay | Admitting: Family Medicine

## 2012-05-17 NOTE — Telephone Encounter (Signed)
Please contact patient for follow-up appointment as instructed on lab from 04/27/12, then route back to [facility] to fill medication

## 2012-05-18 NOTE — Telephone Encounter (Signed)
I will be glad to call patient and offer her another appt., last refill  NOTED 8.25.13-pt stated she could not come back in due to her current financial situation. If she refuses another appt., do you want me to adviser her she will not be able to get any future fills? Please advise before I call.  thankls

## 2012-05-18 NOTE — Telephone Encounter (Signed)
Called pt no ans. lmom to call and schedule follow up appt

## 2012-05-18 NOTE — Telephone Encounter (Signed)
No only Dr.Lowne can deny medications. Thank you     KP

## 2012-05-20 NOTE — Telephone Encounter (Signed)
Patient returned my call today, stated financially it works best for her to come in Nov & May Advised pt last cpe was 2011---made CPE for 11.21.13 1030am, she will schedule f/u upon leaving this appt, can you please call in her refill if it has not been done already  thanks

## 2012-05-20 NOTE — Telephone Encounter (Signed)
°  It was originally Therapist, nutritional for Victoza 18MG  SOLN  Inject 1.8 milligram subcutaneously daily

## 2012-05-24 NOTE — Telephone Encounter (Signed)
Called pt at number in demographics ABM lm to call ans schedule follow up appt at/labs

## 2012-05-28 ENCOUNTER — Other Ambulatory Visit: Payer: Self-pay | Admitting: Family Medicine

## 2012-06-18 ENCOUNTER — Other Ambulatory Visit: Payer: Self-pay | Admitting: Family Medicine

## 2012-06-18 NOTE — Telephone Encounter (Signed)
Last OV 10/28/11. Last filled 05/17/12  9mL no refill.   Plz advise     MW

## 2012-07-29 ENCOUNTER — Encounter: Payer: Self-pay | Admitting: Family Medicine

## 2012-07-29 ENCOUNTER — Ambulatory Visit (INDEPENDENT_AMBULATORY_CARE_PROVIDER_SITE_OTHER): Payer: BC Managed Care – PPO | Admitting: Family Medicine

## 2012-07-29 VITALS — BP 140/82 | HR 98 | Temp 98.6°F | Ht 63.5 in | Wt 262.0 lb

## 2012-07-29 DIAGNOSIS — E119 Type 2 diabetes mellitus without complications: Secondary | ICD-10-CM

## 2012-07-29 DIAGNOSIS — I10 Essential (primary) hypertension: Secondary | ICD-10-CM

## 2012-07-29 DIAGNOSIS — E785 Hyperlipidemia, unspecified: Secondary | ICD-10-CM

## 2012-07-29 MED ORDER — "PEN NEEDLES 3/16"" 31G X 5 MM MISC": 1.0000 | Status: DC

## 2012-07-29 NOTE — Patient Instructions (Addendum)
Diabetes Meal Planning Guide The diabetes meal planning guide is a tool to help you plan your meals and snacks. It is important for people with diabetes to manage their blood glucose (sugar) levels. Choosing the right foods and the right amounts throughout your day will help control your blood glucose. Eating right can even help you improve your blood pressure and reach or maintain a healthy weight. CARBOHYDRATE COUNTING MADE EASY When you eat carbohydrates, they turn to sugar. This raises your blood glucose level. Counting carbohydrates can help you control this level so you feel better. When you plan your meals by counting carbohydrates, you can have more flexibility in what you eat and balance your medicine with your food intake. Carbohydrate counting simply means adding up the total amount of carbohydrate grams in your meals and snacks. Try to eat about the same amount at each meal. Foods with carbohydrates are listed below. Each portion below is 1 carbohydrate serving or 15 grams of carbohydrates. Ask your dietician how many grams of carbohydrates you should eat at each meal or snack. Grains and Starches  1 slice bread.   English muffin or hotdog/hamburger bun.   cup cold cereal (unsweetened).   cup cooked pasta or rice.   cup starchy vegetables (corn, potatoes, peas, beans, winter squash).  1 tortilla (6 inches).   bagel.  1 waffle or pancake (size of a CD).   cup cooked cereal.  4 to 6 small crackers. *Whole grain is recommended. Fruit  1 cup fresh unsweetened berries, melon, papaya, pineapple.  1 small fresh fruit.   banana or mango.   cup fruit juice (4 oz unsweetened).   cup canned fruit in natural juice or water.  2 tbs dried fruit.  12 to 15 grapes or cherries. Milk and Yogurt  1 cup fat-free or 1% milk.  1 cup soy milk.  6 oz light yogurt with sugar-free sweetener.  6 oz low-fat soy yogurt.  6 oz plain yogurt. Vegetables  1 cup raw or  cup  cooked is counted as 0 carbohydrates or a "free" food.  If you eat 3 or more servings at 1 meal, count them as 1 carbohydrate serving. Other Carbohydrates   oz chips or pretzels.   cup ice cream or frozen yogurt.   cup sherbet or sorbet.  2 inch square cake, no frosting.  1 tbs honey, sugar, jam, jelly, or syrup.  2 small cookies.  3 squares of graham crackers.  3 cups popcorn.  6 crackers.  1 cup broth-based soup.  Count 1 cup casserole or other mixed foods as 2 carbohydrate servings.  Foods with less than 20 calories in a serving may be counted as 0 carbohydrates or a "free" food. You may want to purchase a book or computer software that lists the carbohydrate gram counts of different foods. In addition, the nutrition facts panel on the labels of the foods you eat are a good source of this information. The label will tell you how big the serving size is and the total number of carbohydrate grams you will be eating per serving. Divide this number by 15 to obtain the number of carbohydrate servings in a portion. Remember, 1 carbohydrate serving equals 15 grams of carbohydrate. SERVING SIZES Measuring foods and serving sizes helps you make sure you are getting the right amount of food. The list below tells how big or small some common serving sizes are.  1 oz.........4 stacked dice.  3 oz.........Deck of cards.  1 tsp........Tip   of little finger.  1 tbs......Marland KitchenMarland KitchenThumb.  2 tbs.......Marland KitchenGolf ball.   cup......Marland KitchenHalf of a fist.  1 cup.......Marland KitchenA fist. SAMPLE DIABETES MEAL PLAN Below is a sample meal plan that includes foods from the grain and starches, dairy, vegetable, fruit, and meat groups. A dietician can individualize a meal plan to fit your calorie needs and tell you the number of servings needed from each food group. However, controlling the total amount of carbohydrates in your meal or snack is more important than making sure you include all of the food groups at every  meal. You may interchange carbohydrate containing foods (dairy, starches, and fruits). The meal plan below is an example of a 2000 calorie diet using carbohydrate counting. This meal plan has 17 carbohydrate servings. Breakfast  1 cup oatmeal (2 carb servings).   cup light yogurt (1 carb serving).  1 cup blueberries (1 carb serving).   cup almonds. Snack  1 large apple (2 carb servings).  1 low-fat string cheese stick. Lunch  Chicken breast salad.  1 cup spinach.   cup chopped tomatoes.  2 oz chicken breast, sliced.  2 tbs low-fat [place] dressing.  12 whole-wheat crackers (2 carb servings).  12 to 15 grapes (1 carb serving).  1 cup low-fat milk (1 carb serving). Snack  1 cup carrots.   cup hummus (1 carb serving). Dinner  3 oz broiled salmon.  1 cup brown rice (3 carb servings). Snack  1  cups steamed broccoli (1 carb serving) drizzled with 1 tsp olive oil and lemon juice.  1 cup light pudding (2 carb servings). DIABETES MEAL PLANNING WORKSHEET Your dietician can use this worksheet to help you decide how many servings of foods and what types of foods are right for you.  BREAKFAST Food Group and Servings / Carb Servings Grain/Starches __________________________________ Dairy __________________________________________ Vegetable ______________________________________ Fruit ___________________________________________ Meat __________________________________________ Fat ____________________________________________ LUNCH Food Group and Servings / Carb Servings Grain/Starches ___________________________________ Dairy ___________________________________________ Fruit ____________________________________________ Meat ___________________________________________ Fat _____________________________________________ Laural Golden Food Group and Servings / Carb Servings Grain/Starches ___________________________________ Dairy  ___________________________________________ Fruit ____________________________________________ Meat ___________________________________________ Fat _____________________________________________ SNACKS Food Group and Servings / Carb Servings Grain/Starches ___________________________________ Dairy ___________________________________________ Vegetable _______________________________________ Fruit ____________________________________________ Meat ___________________________________________ Fat _____________________________________________ DAILY TOTALS Starches _________________________ Vegetable ________________________ Fruit ____________________________ Dairy ____________________________ Meat ____________________________ Fat ______________________________ Document Released: 05/22/2005 Document Revised: 11/17/2011 Document Reviewed: 04/02/2009 Exit[employer] Patient Information 2013 [place], [place].  Diabetes and Exercise Regular exercise is important and can help:   Control blood glucose (sugar).  Decrease blood pressure.    Control blood lipids (cholesterol, triglycerides).  Improve overall health. BENEFITS FROM EXERCISE  Improved fitness.  Improved flexibility.  Improved endurance.  Increased bone density.  Weight control.  Increased muscle strength.  Decreased body fat.  Improvement of the body's use of insulin, a hormone.  Increased insulin sensitivity.  Reduction of insulin needs.  Reduced stress and tension.  Helps you feel better. People with diabetes who add exercise to their lifestyle gain additional benefits, including:  Weight loss.  Reduced appetite.  Improvement of the body's use of blood glucose.  Decreased risk factors for heart disease:  Lowering of cholesterol and triglycerides.  Raising the level of good cholesterol (high-density lipoproteins, HDL).  Lowering blood sugar.  Decreased blood pressure. TYPE 1 DIABETES AND  EXERCISE  Exercise will usually lower your blood glucose.  If blood glucose is greater than 240 mg/dl, check urine ketones. If ketones are present, do not exercise.  Location of the insulin injection sites may need to be adjusted  with exercise. Avoid injecting insulin into areas of the body that will be exercised. For example, avoid injecting insulin into:  The arms when playing tennis.  The legs when jogging. For more information, discuss this with your caregiver.  Keep a record of:  Food intake.  Type and amount of exercise.  Expected peak times of insulin action.  Blood glucose levels. Do this before, during, and after exercise. Review your records with your caregiver. This will help you to develop guidelines for adjusting food intake and insulin amounts.  TYPE 2 DIABETES AND EXERCISE  Regular physical activity can help control blood glucose.  Exercise is important because it may:  Increase the body's sensitivity to insulin.  Improve blood glucose control.  Exercise reduces the risk of heart disease. It decreases serum cholesterol and triglycerides. It also lowers blood pressure.  Those who take insulin or oral hypoglycemic agents should watch for signs of hypoglycemia. These signs include dizziness, shaking, sweating, chills, and confusion.  Body water is lost during exercise. It must be replaced. This will help to avoid loss of body fluids (dehydration) or heat stroke. Be sure to talk to your caregiver before starting an exercise program to make sure it is safe for you. Remember, any activity is better than none.  Document Released: 11/15/2003 Document Revised: 11/17/2011 Document Reviewed: 03/01/2009 [facility] Patient Information 2013 [place], [place].

## 2012-07-29 NOTE — Progress Notes (Signed)
  Subjective:    Patient ID: Jane Young, female    DOB: 1950/12/13, 61 y.o.   MRN: [id number]  HPI HYPERTENSION Disease Monitoring Blood pressure range-not checking  Chest pain- no      Dyspnea- no Medications Compliance- good  Lightheadedness- no   Edema- no   DIABETES Disease Monitoring Blood Sugar ranges-not checking Polyuria- no New Visual problems- no Medications Compliance- good Hypoglycemic symptoms- no   HYPERLIPIDEMIA Disease Monitoring See symptoms for Hypertension Medications Compliance- good RUQ pain- no  Muscle aches- no  ROS See HPI above   PMH Smoking Status noted   Review of Systems As above    Objective:   Physical Exam  BP 140/82  Pulse 98  Temp 98.6 F (37 C) (Oral)  Ht 5' 3.5" (1.613 m)  Wt 262 lb (118.842 kg)  BMI 45.68 kg/m2  SpO2 97% General appearance: alert, cooperative, appears stated age and no distress Lungs: clear to auscultation bilaterally Heart: S1, S2 normal Extremities: extremities normal, atraumatic, no cyanosis or edema Sensory exam of the foot is normal, tested with the monofilament. Good pulses, no lesions or ulcers, good peripheral pulses.      Assessment & Plan:

## 2012-07-29 NOTE — Assessment & Plan Note (Signed)
con't meds 

## 2012-07-29 NOTE — Assessment & Plan Note (Signed)
Not controlled  A1c 9.6--  Pt not compliant  She will work on it --diet and exercise Recheck 3 months

## 2012-07-29 NOTE — Assessment & Plan Note (Signed)
Recheck labs 

## 2012-09-24 ENCOUNTER — Other Ambulatory Visit: Payer: Self-pay | Admitting: Family Medicine

## 2012-10-22 ENCOUNTER — Other Ambulatory Visit: Payer: Self-pay | Admitting: Family Medicine

## 2012-10-23 ENCOUNTER — Other Ambulatory Visit: Payer: Self-pay

## 2012-10-26 ENCOUNTER — Other Ambulatory Visit: Payer: Self-pay | Admitting: Family Medicine

## 2012-11-02 ENCOUNTER — Other Ambulatory Visit: Payer: Self-pay | Admitting: Family Medicine

## 2012-11-26 ENCOUNTER — Other Ambulatory Visit: Payer: Self-pay | Admitting: Family Medicine

## 2012-12-10 ENCOUNTER — Other Ambulatory Visit: Payer: Self-pay | Admitting: Family Medicine

## 2012-12-12 ENCOUNTER — Other Ambulatory Visit: Payer: Self-pay | Admitting: Family Medicine

## 2012-12-22 ENCOUNTER — Other Ambulatory Visit: Payer: Self-pay | Admitting: Family Medicine

## 2012-12-23 ENCOUNTER — Other Ambulatory Visit: Payer: Self-pay | Admitting: Family Medicine

## 2013-01-10 ENCOUNTER — Encounter: Payer: Self-pay | Admitting: Family Medicine

## 2013-01-10 ENCOUNTER — Ambulatory Visit (INDEPENDENT_AMBULATORY_CARE_PROVIDER_SITE_OTHER): Payer: BC Managed Care – PPO | Admitting: Family Medicine

## 2013-01-10 VITALS — BP 140/74 | HR 90 | Temp 98.7°F | Wt 256.2 lb

## 2013-01-10 DIAGNOSIS — E1165 Type 2 diabetes mellitus with hyperglycemia: Secondary | ICD-10-CM

## 2013-01-10 DIAGNOSIS — IMO0002 Reserved for concepts with insufficient information to code with codable children: Secondary | ICD-10-CM

## 2013-01-10 DIAGNOSIS — I10 Essential (primary) hypertension: Secondary | ICD-10-CM

## 2013-01-10 DIAGNOSIS — E785 Hyperlipidemia, unspecified: Secondary | ICD-10-CM

## 2013-01-10 DIAGNOSIS — E118 Type 2 diabetes mellitus with unspecified complications: Secondary | ICD-10-CM

## 2013-01-10 DIAGNOSIS — E119 Type 2 diabetes mellitus without complications: Secondary | ICD-10-CM

## 2013-01-10 LAB — HEPATIC FUNCTION PANEL
ALT: 57 U/L — ABNORMAL HIGH (ref 0–35)
AST: 44 U/L — ABNORMAL HIGH (ref 0–37)
Albumin: 4.3 g/dL (ref 3.5–5.2)
Alkaline Phosphatase: 64 U/L (ref 39–117)

## 2013-01-10 LAB — LIPID PANEL: Triglycerides: 249 mg/dL — ABNORMAL HIGH (ref 0.0–149.0)

## 2013-01-10 LAB — MICROALBUMIN / CREATININE URINE RATIO
Creatinine,U: 34 mg/dL
Microalb, Ur: 0.7 mg/dL (ref 0.0–1.9)

## 2013-01-10 LAB — POCT URINALYSIS DIPSTICK
Leukocytes, UA: NEGATIVE
Nitrite, UA: NEGATIVE
Protein, UA: NEGATIVE
Urobilinogen, UA: 0.2

## 2013-01-10 LAB — BASIC METABOLIC PANEL
BUN: 18 mg/dL (ref 6–23)
CO2: 26 mEq/L (ref 19–32)
Chloride: 98 mEq/L (ref 96–112)
Creatinine, Ser: 0.9 mg/dL (ref 0.4–1.2)
Glucose, Bld: 228 mg/dL — ABNORMAL HIGH (ref 70–99)
Potassium: 3.9 mEq/L (ref 3.5–5.1)

## 2013-01-10 NOTE — Assessment & Plan Note (Signed)
Check labs con't meds 

## 2013-01-10 NOTE — Progress Notes (Signed)
  Subjective:    Patient ID: Jane Young, female    DOB: 1951-05-31, 62 y.o.   MRN: [id number]  HPI HYPERTENSION Disease Monitoring Blood pressure range-good Chest pain- no      Dyspnea- no Medications Compliance- good Lightheadedness- no   Edema- no   DIABETES Disease Monitoring Blood Sugar ranges- not checking Polyuria- no New Visual problems- no Medications Compliance- good Hypoglycemic symptoms- no   HYPERLIPIDEMIA Disease Monitoring See symptoms for Hypertension Medications Compliance- good RUQ pain- no  Muscle aches- no  ROS See HPI above   PMH Smoking Status noted     Review of Systems As above    Objective:   Physical Exam  BP 140/74  Pulse 90  Temp(Src) 98.7 F (37.1 C) (Oral)  Wt 256 lb 3.2 oz (116.212 kg)  BMI 44.67 kg/m2  SpO2 97% General appearance: alert, cooperative, appears stated age and no distress Lungs: clear to auscultation bilaterally Heart: S1, S2 normal Extremities: extremities normal, atraumatic, no cyanosis or edema Sensory exam of the foot is normal, tested with the monofilament. Good pulses, no lesions or ulcers, good peripheral pulses.      Assessment & Plan:

## 2013-01-10 NOTE — Assessment & Plan Note (Addendum)
Stable Cont meds 

## 2013-01-10 NOTE — Patient Instructions (Addendum)
Diabetes and Standards of Medical Care  Diabetes is complicated. You may find that your diabetes team includes a dietitian, nurse, diabetes educator, eye doctor, and more. To help everyone know what is going on and to help you get the care you deserve, the following schedule of care was developed to help keep you on track. Below are the tests, exams, vaccines, medicines, education, and plans you will need. A1c test  Performed at least 2 times a year if you are meeting treatment goals.  Performed 4 times a year if therapy has changed or if you are not meeting therapy/glycemic goals. Aspirin medicine  Take daily as directed by your caregiver. Blood pressure test  Performed at every routine medical visit. The goal is less than 130/80 mm/Hg. Dental exam  Get a dental exam at least 2 times a year. Dilated eye exam (retinal exam)  Type 1 diabetes: Get an exam within 5 years of diagnosis and then yearly.  Type 2 diabetes: Get an exam at diagnosis and then yearly. All exams thereafter can be extended to every 2 to 3 years if one or more exams have been normal. Foot care exam  Visual foot exams are performed at every routine medical visit. The exams check for cuts, injuries, or other problems with the feet.  A comprehensive foot exam should be done yearly. This includes visual inspection as well as assessing foot pulses and testing for loss of sensation. Kidney function test (urine microalbumin)  Performed once a year.  Type 1 diabetes: The first test is performed 5 years after diagnosis.  Type 2 diabetes: The first test is performed at the time of diagnosis.  A serum creatinine and estimated glomerular filtration rate (eGFR) test is done once a year to tell the level of chronic kidney disease (CKD), if present. Lipid profile (Cholesterol, HDL, LDL, Triglycerides)  Performed once a year for most people. If at low risk, may be assessed every 2 years.  The goal for LDL is less than 100  mg/dl. If at high risk, the goal is less than 70 mg/dl.  The goal for HDL is higher than 40 mg/dl for men and higher than 50 mg/dl for women.  The goal for triglycerides is less than 150 mg/dl. Flu vaccine, pneumonia vaccine, and hepatitis B vaccine  The flu vaccine is recommended yearly.  The pneumonia vaccine is generally given once in a lifetime. However, there are some instances where another vaccine is recommended. Check with your caregiver.  The hepatitis B vaccine is also recommended for adults with diabetes. Diabetes self-management education  Recommended at diagnosis and ongoing as needed. Treatment plan  Reviewed at every medical visit. Document Released: 06/22/2009 Document Revised: 11/17/2011 Document Reviewed: 02/25/2011 Exit[employer] Patient Information 2013 [employer].  

## 2013-01-11 LAB — LDL CHOLESTEROL, DIRECT: Direct LDL: 112.8 mg/dL

## 2013-01-17 ENCOUNTER — Other Ambulatory Visit: Payer: Self-pay

## 2013-01-17 DIAGNOSIS — E1165 Type 2 diabetes mellitus with hyperglycemia: Secondary | ICD-10-CM

## 2013-01-17 DIAGNOSIS — R7989 Other specified abnormal findings of blood chemistry: Secondary | ICD-10-CM

## 2013-01-17 NOTE — Progress Notes (Signed)
Orders are in.     KP 

## 2013-01-19 ENCOUNTER — Telehealth: Payer: Self-pay | Admitting: Family Medicine

## 2013-01-19 NOTE — Telephone Encounter (Signed)
Patient declined endo referral.     KP

## 2013-01-19 NOTE — Telephone Encounter (Signed)
Patient is returning Kim's call about results.  Also, in reference to Endocrinology referral, and Abdominal U/S, patient has declined both.  Patient was scheduled for appointments, told me to cancel both.  States she can not afford to go to a specialist, and that her liver function is probably elevated due to recent celebrations of her anniversary and birthday.  States she drank a lot of alcohol.

## 2013-01-19 NOTE — Telephone Encounter (Signed)
I was calling her to advise of the referral. -- FYI sen to MD to make her aware patient declined apt.     KP

## 2013-01-19 NOTE — Telephone Encounter (Signed)
Please remember endocrine with [facility] is not a specialty copay-- it is primary care copay

## 2013-01-21 ENCOUNTER — Ambulatory Visit (HOSPITAL_COMMUNITY): Payer: BC Managed Care – PPO

## 2013-01-23 ENCOUNTER — Other Ambulatory Visit: Payer: Self-pay | Admitting: Family Medicine

## 2013-01-28 ENCOUNTER — Other Ambulatory Visit: Payer: Self-pay | Admitting: Family Medicine

## 2013-02-01 ENCOUNTER — Ambulatory Visit: Payer: BC Managed Care – PPO | Admitting: Internal Medicine

## 2013-02-27 ENCOUNTER — Other Ambulatory Visit: Payer: Self-pay | Admitting: Family Medicine

## 2013-03-17 ENCOUNTER — Other Ambulatory Visit: Payer: Self-pay

## 2013-03-29 ENCOUNTER — Other Ambulatory Visit: Payer: Self-pay | Admitting: Family Medicine

## 2013-04-28 ENCOUNTER — Other Ambulatory Visit: Payer: Self-pay | Admitting: Family Medicine

## 2013-05-30 ENCOUNTER — Other Ambulatory Visit: Payer: Self-pay | Admitting: Family Medicine

## 2013-06-28 ENCOUNTER — Other Ambulatory Visit: Payer: Self-pay | Admitting: Family Medicine

## 2013-07-14 ENCOUNTER — Other Ambulatory Visit: Payer: Self-pay

## 2013-07-23 ENCOUNTER — Other Ambulatory Visit: Payer: Self-pay | Admitting: Family Medicine

## 2013-07-25 ENCOUNTER — Encounter: Payer: Self-pay | Admitting: Family Medicine

## 2013-07-25 ENCOUNTER — Ambulatory Visit (INDEPENDENT_AMBULATORY_CARE_PROVIDER_SITE_OTHER): Payer: BC Managed Care – PPO | Admitting: Family Medicine

## 2013-07-25 VITALS — BP 130/80 | HR 73 | Temp 98.8°F | Wt 251.4 lb

## 2013-07-25 DIAGNOSIS — E785 Hyperlipidemia, unspecified: Secondary | ICD-10-CM

## 2013-07-25 DIAGNOSIS — E119 Type 2 diabetes mellitus without complications: Secondary | ICD-10-CM

## 2013-07-25 DIAGNOSIS — I10 Essential (primary) hypertension: Secondary | ICD-10-CM

## 2013-07-25 DIAGNOSIS — E1159 Type 2 diabetes mellitus with other circulatory complications: Secondary | ICD-10-CM

## 2013-07-25 DIAGNOSIS — R011 Cardiac murmur, unspecified: Secondary | ICD-10-CM

## 2013-07-25 LAB — HEPATIC FUNCTION PANEL
AST: 40 U/L — ABNORMAL HIGH (ref 0–37)
Alkaline Phosphatase: 62 U/L (ref 39–117)
Bilirubin, Direct: 0 mg/dL (ref 0.0–0.3)
Total Bilirubin: 0.8 mg/dL (ref 0.3–1.2)
Total Protein: 7.7 g/dL (ref 6.0–8.3)

## 2013-07-25 LAB — BASIC METABOLIC PANEL
BUN: 14 mg/dL (ref 6–23)
CO2: 27 mEq/L (ref 19–32)
Creatinine, Ser: 0.8 mg/dL (ref 0.4–1.2)
GFR: 73.36 mL/min (ref >60.00)

## 2013-07-25 LAB — CBC WITH DIFFERENTIAL/PLATELET
Basophils Absolute: 0.1 10*3/uL (ref 0.0–0.1)
Eosinophils Absolute: 0.3 10*3/uL (ref 0.0–0.7)
Eosinophils Relative: 4.9 % (ref 0.0–5.0)
MCV: 85.3 fl (ref 78.0–100.0)
Monocytes Absolute: 0.3 10*3/uL (ref 0.1–1.0)
Monocytes Relative: 6.2 % (ref 3.0–12.0)
Neutro Abs: 2.4 10*3/uL (ref 1.4–7.7)
Neutrophils Relative %: 43.5 % (ref 43.0–77.0)
Platelets: 220 10*3/uL (ref 150.0–400.0)
RDW: 13.2 % (ref 11.5–14.6)
WBC: 5.5 10*3/uL (ref 4.5–10.5)

## 2013-07-25 LAB — LIPID PANEL
Cholesterol: 184 mg/dL (ref 0–200)
Total CHOL/HDL Ratio: 3
Triglycerides: 212 mg/dL — ABNORMAL HIGH (ref 0.0–149.0)
VLDL: 42.4 mg/dL — ABNORMAL HIGH (ref 0.0–40.0)

## 2013-07-25 LAB — POCT URINALYSIS DIPSTICK
Blood, UA: NEGATIVE
Glucose, UA: NEGATIVE
Ketones, UA: NEGATIVE
Nitrite, UA: NEGATIVE
Urobilinogen, UA: 0.2
pH, UA: 6

## 2013-07-25 LAB — HEMOGLOBIN A1C: Hgb A1c MFr Bld: 10.3 % — ABNORMAL HIGH (ref 4.6–6.5)

## 2013-07-25 MED ORDER — METOPROLOL TARTRATE 25 MG PO TABS: ORAL_TABLET | ORAL | Status: DC

## 2013-07-25 MED ORDER — METFORMIN HCL 1000 MG PO TABS: ORAL_TABLET | ORAL | Status: DC

## 2013-07-25 MED ORDER — INSULIN DETEMIR 100 UNIT/ML FLEXPEN: PEN_INJECTOR | SUBCUTANEOUS | Status: DC

## 2013-07-25 MED ORDER — ATORVASTATIN CALCIUM 20 MG PO TABS: ORAL_TABLET | ORAL | Status: DC

## 2013-07-25 MED ORDER — ENALAPRIL MALEATE 10 MG PO TABS: ORAL_TABLET | ORAL | Status: DC

## 2013-07-25 MED ORDER — LIRAGLUTIDE 18 MG/3ML ~~LOC~~ SOPN: PEN_INJECTOR | SUBCUTANEOUS | Status: DC

## 2013-07-25 MED ORDER — FUROSEMIDE 20 MG PO TABS: ORAL_TABLET | ORAL | Status: DC

## 2013-07-25 MED ORDER — AMLODIPINE BESYLATE 10 MG PO TABS: 10.0000 mg | ORAL_TABLET | Freq: Every day | ORAL | Status: DC

## 2013-07-25 NOTE — Assessment & Plan Note (Signed)
Check labs con't meds 

## 2013-07-25 NOTE — Assessment & Plan Note (Signed)
Check labs Cont' meds 

## 2013-07-25 NOTE — Progress Notes (Signed)
  Subjective:    Patient ID: Jane Young, female    DOB: 07/05/1951, 62 y.o.   MRN: [id number]  HPI  HPI HYPERTENSION  Blood pressure range-not checking  Chest pain- no      Dyspnea- no Lightheadedness- no   Edema- no Other side effects - no   Medication compliance: good Low salt diet- yes  DIABETES  Blood Sugar ranges-not checking   Polyuria- no New Visual problems- no Hypoglycemic symptoms- no Other side effects-none Medication compliance - good Last eye exam- 04/2011 Foot exam- today  HYPERLIPIDEMIA  Medication compliance- good RUQ pain- no  Muscle aches- no Other side effects-no  ROS See HPI above   PMH Smoking Status noted       Review of Systems As above     Objective:   Physical Exam  BP 130/80  Pulse 73  Temp(Src) 98.8 F (37.1 C) (Oral)  Wt 251 lb 6.4 oz (114.034 kg)  SpO2 97% General appearance: alert, cooperative, appears stated age and no distress Neck: no adenopathy, no carotid bruit, no JVD, supple, symmetrical, trachea midline and thyroid not enlarged, symmetric, no tenderness/mass/nodules Lungs: clear to auscultation bilaterally Heart: S1, S2 normal  + murmur Extremities: extremities normal, atraumatic, no cyanosis or edema Sensory exam of the foot is normal, tested with the monofilament. Good pulses, no lesions or ulcers, good peripheral pulses.       Assessment & Plan:

## 2013-07-25 NOTE — Patient Instructions (Signed)

## 2013-07-25 NOTE — Progress Notes (Signed)
Pre visit review using our clinic review tool, if applicable. No additional management support is needed unless otherwise documented below in the visit note. 

## 2013-07-25 NOTE — Telephone Encounter (Signed)
Letter mailed     KP 

## 2013-07-26 LAB — LDL CHOLESTEROL, DIRECT: Direct LDL: 114 mg/dL

## 2013-07-30 ENCOUNTER — Other Ambulatory Visit: Payer: Self-pay | Admitting: Family Medicine

## 2013-07-30 DIAGNOSIS — IMO0002 Reserved for concepts with insufficient information to code with codable children: Secondary | ICD-10-CM

## 2013-07-30 DIAGNOSIS — E1165 Type 2 diabetes mellitus with hyperglycemia: Secondary | ICD-10-CM

## 2013-09-08 ENCOUNTER — Other Ambulatory Visit: Payer: Self-pay | Admitting: Family Medicine

## 2013-10-10 ENCOUNTER — Other Ambulatory Visit: Payer: Self-pay | Admitting: *Deleted

## 2013-10-10 MED ORDER — INSULIN DETEMIR 100 UNIT/ML FLEXPEN: PEN_INJECTOR | SUBCUTANEOUS | Status: DC

## 2013-10-10 NOTE — Telephone Encounter (Signed)
Patient called and wanted to see if we could call her pharmacy and resend her rx nsulin Detemir (LEVEMIR FLEXPEN) 100 UNIT/ML SOPN [[id number]][[id number]] for 30 days. Patient stated that her insurance is now charging two copays if her rx is written for more than 30 days at a time.   Pharmacy [id number] [place] - [phone]

## 2013-10-17 ENCOUNTER — Other Ambulatory Visit: Payer: Self-pay | Admitting: *Deleted

## 2013-10-19 ENCOUNTER — Other Ambulatory Visit: Payer: Self-pay | Admitting: *Deleted

## 2013-10-19 ENCOUNTER — Telehealth: Payer: Self-pay | Admitting: *Deleted

## 2013-10-19 MED ORDER — INSULIN DETEMIR 100 UNIT/ML FLEXPEN: PEN_INJECTOR | SUBCUTANEOUS | Status: DC

## 2013-10-19 NOTE — Telephone Encounter (Signed)
Patient called and stated [facility]. Insurance will only cover a 30 day supply and for them to pay, the prescription needs to be written that patient injects 50 units per day, but patient will only inject 40 units per day. I spoke with Vidal SchwalbeSandra Johnson, RN, concerning this and she approved for script to be written as requested. New script sent to pharmacy. JG//CMA

## 2014-01-20 ENCOUNTER — Other Ambulatory Visit: Payer: Self-pay | Admitting: Family Medicine

## 2014-01-20 NOTE — Telephone Encounter (Signed)
Rx sent to the pharmacy by e-script.//AB/CMA 

## 2014-02-10 ENCOUNTER — Telehealth: Payer: Self-pay | Admitting: *Deleted

## 2014-02-10 MED ORDER — LIRAGLUTIDE 18 MG/3ML ~~LOC~~ SOPN: PEN_INJECTOR | SUBCUTANEOUS | Status: DC

## 2014-02-10 NOTE — Telephone Encounter (Signed)
Rx faxed.    KP 

## 2014-02-10 NOTE — Telephone Encounter (Signed)
Caller name:  Kiyoko Relation to pt:  self Call back number:  [phone] Pharmacy:  [facility] [address]  Reason for call:   Pt request refill on  Liraglutide (VICTOZA) 18 MG/3ML SOPN  Last refill 07/25/2013, 72ml, 5 refills  Medication follow up scheduled for Monday 03/06/2014 at 10am.  Pt will run out in the next few days.  Please advise.  bw

## 2014-03-06 ENCOUNTER — Encounter: Payer: Self-pay | Admitting: Family Medicine

## 2014-03-06 ENCOUNTER — Ambulatory Visit (INDEPENDENT_AMBULATORY_CARE_PROVIDER_SITE_OTHER): Payer: BC Managed Care – PPO | Admitting: Family Medicine

## 2014-03-06 VITALS — BP 150/76 | HR 67 | Temp 98.7°F | Wt 243.0 lb

## 2014-03-06 DIAGNOSIS — I10 Essential (primary) hypertension: Secondary | ICD-10-CM

## 2014-03-06 DIAGNOSIS — E1159 Type 2 diabetes mellitus with other circulatory complications: Secondary | ICD-10-CM

## 2014-03-06 DIAGNOSIS — E785 Hyperlipidemia, unspecified: Secondary | ICD-10-CM

## 2014-03-06 LAB — MICROALBUMIN / CREATININE URINE RATIO
Creatinine,U: 32 mg/dL
MICROALB UR: 0.3 mg/dL (ref 0.0–1.9)
Microalb Creat Ratio: 0.9 mg/g (ref 0.0–30.0)

## 2014-03-06 LAB — BASIC METABOLIC PANEL
BUN: 15 mg/dL (ref 6–23)
CHLORIDE: 98 meq/L (ref 96–112)
CO2: 29 mEq/L (ref 19–32)
Calcium: 9.6 mg/dL (ref 8.4–10.5)
Creatinine, Ser: 0.8 mg/dL (ref 0.4–1.2)
GFR: 83.44 mL/min (ref >60.00)
Glucose, Bld: 174 mg/dL — ABNORMAL HIGH (ref 70–99)
POTASSIUM: 3.6 meq/L (ref 3.5–5.1)
Sodium: 137 mEq/L (ref 135–145)

## 2014-03-06 LAB — LIPID PANEL
CHOLESTEROL: 192 mg/dL (ref 0–200)
HDL: 57.6 mg/dL (ref >39.00)
LDL CALC: 85 mg/dL (ref 0–99)
NonHDL: 134.4
TRIGLYCERIDES: 247 mg/dL — AB (ref 0.0–149.0)
Total CHOL/HDL Ratio: 3
VLDL: 49.4 mg/dL — AB (ref 0.0–40.0)

## 2014-03-06 LAB — HEMOGLOBIN A1C: HEMOGLOBIN A1C: 9.2 % — AB (ref 4.6–6.5)

## 2014-03-06 LAB — HEPATIC FUNCTION PANEL
ALT: 38 U/L — ABNORMAL HIGH (ref 0–35)
AST: 35 U/L (ref 0–37)
Albumin: 4.5 g/dL (ref 3.5–5.2)
Alkaline Phosphatase: 60 U/L (ref 39–117)
BILIRUBIN TOTAL: 0.9 mg/dL (ref 0.2–1.2)
Bilirubin, Direct: 0 mg/dL (ref 0.0–0.3)
Total Protein: 7.9 g/dL (ref 6.0–8.3)

## 2014-03-06 NOTE — Progress Notes (Signed)
Pre visit review using our clinic review tool, if applicable. No additional management support is needed unless otherwise documented below in the visit note. 

## 2014-03-06 NOTE — Patient Instructions (Signed)
Diabetes and Standards of Medical Care  Diabetes is complicated. You may find that your diabetes team includes a dietitian, nurse, diabetes educator, eye doctor, and more. To help everyone know what is going on and to help you get the care you deserve, the following schedule of care was developed to help keep you on track. Below are the tests, exams, vaccines, medicines, education, and plans you will need. HbA1c test This test shows how well you have controlled your glucose over the past 2-3 months. It is used to see if your diabetes management plan needs to be adjusted.   It is performed at least 2 times a year if you are meeting treatment goals.  It is performed 4 times a year if therapy has changed or if you are not meeting treatment goals. Blood pressure test  This test is performed at every routine medical visit. The goal is less than 140/90 mmHg for most people, but 130/80 mmHg in some cases. Ask your health care provider about your goal. Dental exam  Follow up with the dentist regularly. Eye exam  If you are diagnosed with type 1 diabetes as a child, get an exam upon reaching the age of 10 years or older and have had diabetes for 3-5 years. Yearly eye exams are recommended after that initial eye exam.  If you are diagnosed with type 1 diabetes as an adult, get an exam within 5 years of diagnosis and then yearly.  If you are diagnosed with type 2 diabetes, get an exam as soon as possible after the diagnosis and then yearly. Foot care exam  Visual foot exams are performed at every routine medical visit. The exams check for cuts, injuries, or other problems with the feet.  A comprehensive foot exam should be done yearly. This includes visual inspection as well as assessing foot pulses and testing for loss of sensation.  Check your feet nightly for cuts, injuries, or other problems with your feet. Tell your health care provider if anything is not healing. Kidney function test (urine  microalbumin)  This test is performed once a year.  Type 1 diabetes: The first test is performed 5 years after diagnosis.  Type 2 diabetes: The first test is performed at the time of diagnosis.  A serum creatinine and estimated glomerular filtration rate (eGFR) test is done once a year to assess the level of chronic kidney disease (CKD), if present. Lipid profile (cholesterol, HDL, LDL, triglycerides)  Performed every 5 years for most people.  The goal for LDL is less than 100 mg/dL. If you are at high risk, the goal is less than 70 mg/dL.  The goal for HDL is 40 mg/dL-50 mg/dL for men and 50 mg/dL-60 mg/dL for women. An HDL cholesterol of 60 mg/dL or higher gives some protection against heart disease.  The goal for triglycerides is less than 150 mg/dL. Influenza vaccine, pneumococcal vaccine, and hepatitis B vaccine  The influenza vaccine is recommended yearly.  The pneumococcal vaccine is generally given once in a lifetime. However, there are some instances when another vaccination is recommended. Check with your health care provider.  The hepatitis B vaccine is also recommended for adults with diabetes. Diabetes self-management education  Education is recommended at diagnosis and ongoing as needed. Treatment plan  Your treatment plan is reviewed at every medical visit. Document Released: 06/22/2009 Document Revised: 04/27/2013 Document Reviewed: 01/25/2013 Exit[employer] Patient Information 2015 [employer]. This information is not intended to replace advice given to you by your   health care provider. Make sure you discuss any questions you have with your health care provider.

## 2014-03-06 NOTE — Progress Notes (Signed)
   Subjective:    Patient ID: Jane Young, female    DOB: 11-27-1950, 63 y.o.   MRN: [id number]  Diabetes Pertinent negatives for hypoglycemia include no dizziness or headaches. Pertinent negatives for diabetes include no chest pain, no fatigue, no polydipsia, no polyphagia and no polyuria.  Hypertension Pertinent negatives include no chest pain, headaches, palpitations or shortness of breath.  Hyperlipidemia Pertinent negatives include no chest pain or shortness of breath.   HPI HYPERTENSION  Blood pressure range-not checking  Chest pain- yes-- indigestion-- resolves with gas med    Dyspnea- no Lightheadedness- no   Edema- no Other side effects - no   Medication compliance: good Low salt diet- no  DIABETES  Blood Sugar ranges-not checking   Polyuria- no New Visual problems- no Hypoglycemic symptoms- no Other side effects-no Medication compliance - good Last eye exam- due Foot exam- today  HYPERLIPIDEMIA  Medication compliance- good RUQ pain- no  Muscle aches- no Other side effects-no   Review of Systems  Constitutional: Negative for diaphoresis, appetite change, fatigue and unexpected weight change.  Eyes: Negative for pain, redness and visual disturbance.  Respiratory: Negative for cough, chest tightness, shortness of breath and wheezing.   Cardiovascular: Negative for chest pain, palpitations and leg swelling.  Endocrine: Negative for cold intolerance, heat intolerance, polydipsia, polyphagia and polyuria.  Genitourinary: Negative for dysuria, frequency and difficulty urinating.  Neurological: Negative for dizziness, light-headedness, numbness and headaches.       Objective:   Physical Exam  Constitutional: She is oriented to person, place, and time. She appears well-developed and well-nourished.  HENT:  Head: Normocephalic and atraumatic.  Eyes: Conjunctivae and EOM are normal.  Neck: Normal range of motion. Neck supple. No JVD present. Carotid bruit is not  present. No thyromegaly present.  Cardiovascular: Normal rate, regular rhythm and normal heart sounds.   No murmur heard. Pulmonary/Chest: Effort normal and breath sounds normal. No respiratory distress. She has no wheezes. She has no rales. She exhibits no tenderness.  Musculoskeletal: She exhibits no edema.  Neurological: She is alert and oriented to person, place, and time.  Psychiatric: She has a normal mood and affect.          Assessment & Plan:  1. 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 Hyperlipidemia LDL goal <70 Check labs - Basic metabolic panel - Hemoglobin A1c - Hepatic function panel - Lipid panel - Microalbumin / creatinine urine ratio - POCT urinalysis dipstick  3. Essential hypertension Check labs, con't meds - Basic metabolic panel

## 2014-03-07 ENCOUNTER — Telehealth: Payer: Self-pay | Admitting: Family Medicine

## 2014-03-07 LAB — POCT URINALYSIS DIPSTICK
Bilirubin, UA: NEGATIVE
Blood, UA: NEGATIVE
GLUCOSE UA: NEGATIVE
KETONES UA: NEGATIVE
Leukocytes, UA: NEGATIVE
Nitrite, UA: NEGATIVE
Protein, UA: NEGATIVE
Spec Grav, UA: <=1.005
Urobilinogen, UA: 0.2
pH, UA: 6

## 2014-03-07 NOTE — Telephone Encounter (Signed)
Relevant patient education assigned to patient using Emmi. ° °

## 2014-03-13 ENCOUNTER — Other Ambulatory Visit: Payer: Self-pay | Admitting: Family Medicine

## 2014-04-22 ENCOUNTER — Other Ambulatory Visit: Payer: Self-pay | Admitting: Family Medicine

## 2014-04-27 ENCOUNTER — Other Ambulatory Visit: Payer: Self-pay | Admitting: Family Medicine

## 2014-06-02 ENCOUNTER — Other Ambulatory Visit: Payer: Self-pay | Admitting: Family Medicine

## 2014-07-02 ENCOUNTER — Other Ambulatory Visit: Payer: Self-pay | Admitting: Family Medicine

## 2014-08-02 ENCOUNTER — Other Ambulatory Visit: Payer: Self-pay | Admitting: Family Medicine

## 2014-09-26 ENCOUNTER — Telehealth: Payer: Self-pay | Admitting: Family Medicine

## 2014-09-26 MED ORDER — INSULIN DETEMIR 100 UNIT/ML FLEXPEN: 50.0000 [IU] | PEN_INJECTOR | Freq: Every day | SUBCUTANEOUS | Status: DC

## 2014-09-26 NOTE — Telephone Encounter (Signed)
Caller name: Jane Young, Conner Relation to pt: self  Call back number: [phone] [facility] [address] [place] [address] [phone] (Phone     Reason for call:  Pt requesting a refill Insulin Detemir (LEVEMIR FLEXTOUCH) 100 UNIT/ML Pen. Pt scheduled follow up appointment in Feb

## 2014-09-26 NOTE — Telephone Encounter (Signed)
Rx faxed.    KP 

## 2014-10-17 ENCOUNTER — Other Ambulatory Visit: Payer: Self-pay | Admitting: Family Medicine

## 2014-10-19 ENCOUNTER — Other Ambulatory Visit: Payer: Self-pay | Admitting: Family Medicine

## 2014-10-24 ENCOUNTER — Encounter: Payer: Self-pay | Admitting: Family Medicine

## 2014-10-24 ENCOUNTER — Ambulatory Visit (INDEPENDENT_AMBULATORY_CARE_PROVIDER_SITE_OTHER): Payer: BLUE CROSS/BLUE SHIELD | Admitting: Family Medicine

## 2014-10-24 VITALS — BP 122/82 | HR 80 | Temp 98.6°F | Wt 237.0 lb

## 2014-10-24 DIAGNOSIS — E1151 Type 2 diabetes mellitus with diabetic peripheral angiopathy without gangrene: Secondary | ICD-10-CM

## 2014-10-24 DIAGNOSIS — E785 Hyperlipidemia, unspecified: Secondary | ICD-10-CM

## 2014-10-24 DIAGNOSIS — IMO0002 Reserved for concepts with insufficient information to code with codable children: Secondary | ICD-10-CM

## 2014-10-24 DIAGNOSIS — E1159 Type 2 diabetes mellitus with other circulatory complications: Secondary | ICD-10-CM

## 2014-10-24 DIAGNOSIS — I10 Essential (primary) hypertension: Secondary | ICD-10-CM

## 2014-10-24 DIAGNOSIS — E1165 Type 2 diabetes mellitus with hyperglycemia: Secondary | ICD-10-CM

## 2014-10-24 LAB — POCT URINALYSIS DIPSTICK
BILIRUBIN UA: NEGATIVE
Glucose, UA: NEGATIVE
KETONES UA: NEGATIVE
Leukocytes, UA: NEGATIVE
Nitrite, UA: NEGATIVE
Protein, UA: NEGATIVE
RBC UA: NEGATIVE
Spec Grav, UA: >=1.03
Urobilinogen, UA: NEGATIVE
pH, UA: 6

## 2014-10-24 LAB — CBC WITH DIFFERENTIAL/PLATELET
BASOS PCT: 0.7 % (ref 0.0–3.0)
Basophils Absolute: 0 10*3/uL (ref 0.0–0.1)
EOS PCT: 3.6 % (ref 0.0–5.0)
Eosinophils Absolute: 0.2 10*3/uL (ref 0.0–0.7)
HCT: 41.6 % (ref 36.0–46.0)
HEMOGLOBIN: 13.9 g/dL (ref 12.0–15.0)
LYMPHS PCT: 47.3 % — AB (ref 12.0–46.0)
Lymphs Abs: 2.8 10*3/uL (ref 0.7–4.0)
MCHC: 33.5 g/dL (ref 30.0–36.0)
MCV: 84.2 fl (ref 78.0–100.0)
Monocytes Absolute: 0.4 10*3/uL (ref 0.1–1.0)
Monocytes Relative: 6.4 % (ref 3.0–12.0)
NEUTROS PCT: 42 % — AB (ref 43.0–77.0)
Neutro Abs: 2.5 10*3/uL (ref 1.4–7.7)
Platelets: 230 10*3/uL (ref 150.0–400.0)
RBC: 4.94 Mil/uL (ref 3.87–5.11)
RDW: 13.7 % (ref 11.5–15.5)
WBC: 6 10*3/uL (ref 4.0–10.5)

## 2014-10-24 LAB — MICROALBUMIN / CREATININE URINE RATIO
CREATININE, U: 38.9 mg/dL
MICROALB UR: 0.7 mg/dL (ref 0.0–1.9)
MICROALB/CREAT RATIO: 1.8 mg/g (ref 0.0–30.0)

## 2014-10-24 LAB — LIPID PANEL
CHOL/HDL RATIO: 3
Cholesterol: 154 mg/dL (ref 0–200)
HDL: 51.5 mg/dL (ref >39.00)
LDL CALC: 75 mg/dL (ref 0–99)
NONHDL: 102.5
TRIGLYCERIDES: 138 mg/dL (ref 0.0–149.0)
VLDL: 27.6 mg/dL (ref 0.0–40.0)

## 2014-10-24 LAB — BASIC METABOLIC PANEL
BUN: 20 mg/dL (ref 6–23)
CALCIUM: 10.6 mg/dL — AB (ref 8.4–10.5)
CO2: 31 mEq/L (ref 19–32)
Chloride: 101 mEq/L (ref 96–112)
Creatinine, Ser: 0.92 mg/dL (ref 0.40–1.20)
GFR: 65.78 mL/min (ref >60.00)
Glucose, Bld: 102 mg/dL — ABNORMAL HIGH (ref 70–99)
Potassium: 3.6 mEq/L (ref 3.5–5.1)
SODIUM: 138 meq/L (ref 135–145)

## 2014-10-24 LAB — HEPATIC FUNCTION PANEL
ALBUMIN: 4.5 g/dL (ref 3.5–5.2)
ALK PHOS: 58 U/L (ref 39–117)
ALT: 32 U/L (ref 0–35)
AST: 25 U/L (ref 0–37)
Bilirubin, Direct: 0.1 mg/dL (ref 0.0–0.3)
Total Bilirubin: 0.5 mg/dL (ref 0.2–1.2)
Total Protein: 7.5 g/dL (ref 6.0–8.3)

## 2014-10-24 LAB — HEMOGLOBIN A1C: HEMOGLOBIN A1C: 8.5 % — AB (ref 4.6–6.5)

## 2014-10-24 MED ORDER — LIRAGLUTIDE 18 MG/3ML ~~LOC~~ SOPN: PEN_INJECTOR | SUBCUTANEOUS | Status: DC

## 2014-10-24 MED ORDER — INSULIN DETEMIR 100 UNIT/ML FLEXPEN: 50.0000 [IU] | PEN_INJECTOR | Freq: Every day | SUBCUTANEOUS | Status: DC

## 2014-10-24 NOTE — Progress Notes (Signed)
Subjective:    Patient ID: Jane Young, female    DOB: Jan 13, 1951, 64 y.o.   MRN: [id number]  HPI  Patient here for f/u dm, cholesterol and bp.     HYPERTENSION  Blood pressure range-not checking   Chest pain- no      Dyspnea- no Lightheadedness- no   Edema- no Other side effects - no   Medication compliance: good Low salt diet- yes  DIABETES  Blood Sugar ranges-not checking   Polyuria- no New Visual problems- no Hypoglycemic symptoms- no Other side effects-no Medication compliance - good Last eye exam- 03/2013 Foot exam- today  HYPERLIPIDEMIA  Medication compliance- good RUQ pain- no  Muscle aches- no Other side effects-no      Past Medical History  Diagnosis Date  . Hyperlipidemia   . Hypertension   . Diabetes mellitus     Type 2   Current Outpatient Prescriptions  Medication Sig Dispense Refill  . amLODipine (NORVASC) 10 MG tablet take 1 tablet by mouth once daily 90 tablet 1  . atorvastatin (LIPITOR) 20 MG tablet take 1 tablet by mouth once daily 90 tablet 0  . B-D UF III MINI PEN NEEDLES 31G X 5 MM MISC use as directed 100 each 2  . calcium carbonate (OS-CAL) 600 MG TABS Take 600 mg by mouth daily.      . enalapril (VASOTEC) 10 MG tablet take 1 tablet by mouth twice a day 180 tablet 1  . furosemide (LASIX) 20 MG tablet take 1 tablet by mouth once daily 90 tablet 1  . Insulin Detemir (LEVEMIR FLEXTOUCH) 100 UNIT/ML Pen Inject 50 Units into the skin at bedtime. 50 mL 1  . Liraglutide (VICTOZA) 18 MG/3ML SOPN inject 1.8 milligram subcutaneously daily 27 mL 1  . metFORMIN (GLUCOPHAGE) 1000 MG tablet take 1 tablet by mouth twice a day with food 180 tablet 0  . metoprolol tartrate (LOPRESSOR) 25 MG tablet take 1 tablet by mouth three times a day 270 tablet 1  . Multiple Vitamin (MULTIVITAMIN) tablet Take 1 tablet by mouth daily.       No current facility-administered medications for this visit.    Review of Systems  Constitutional: Negative for  diaphoresis, appetite change, fatigue and unexpected weight change.  Eyes: Negative for pain, redness and visual disturbance.  Respiratory: Negative for cough, chest tightness, shortness of breath and wheezing.   Cardiovascular: Negative for chest pain, palpitations and leg swelling.  Endocrine: Negative for cold intolerance, heat intolerance, polydipsia, polyphagia and polyuria.  Genitourinary: Negative for dysuria, frequency and difficulty urinating.  Neurological: Negative for dizziness, light-headedness, numbness and headaches.       Objective:    Physical Exam  Constitutional: She is oriented to person, place, and time. She appears well-developed and well-nourished.  HENT:  Head: Normocephalic and atraumatic.  Eyes: Conjunctivae and EOM are normal.  Neck: Normal range of motion. Neck supple. No JVD present. Carotid bruit is not present. No thyromegaly present.  Cardiovascular: Normal rate and regular rhythm.   Murmur heard. Pulmonary/Chest: Effort normal and breath sounds normal. No respiratory distress. She has no wheezes. She has no rales. She exhibits no tenderness.  Musculoskeletal: She exhibits no edema.  Neurological: She is alert and oriented to person, place, and time.  Psychiatric: She has a normal mood and affect. Her behavior is normal. Judgment and thought content normal.  Sensory exam of the foot is normal, tested with the monofilament. Good pulses, no lesions or ulcers, good peripheral pulses.  BP 122/82 mmHg  Pulse 80  Temp(Src) 98.6 F (37 C) (Oral)  Wt 237 lb (107.502 kg)  SpO2 98% Wt Readings from Last 3 Encounters:  10/24/14 237 lb (107.502 kg)  03/06/14 243 lb (110.224 kg)  07/25/13 251 lb 6.4 oz (114.034 kg)     Lab Results  Component Value Date   WBC 5.5 07/25/2013   HGB 14.1 07/25/2013   HCT 42.5 07/25/2013   PLT 220.0 07/25/2013   GLUCOSE 174* 03/06/2014   CHOL 192 03/06/2014   TRIG 247.0* 03/06/2014   HDL 57.60 03/06/2014   LDLDIRECT  114.0 07/25/2013   LDLCALC 85 03/06/2014   ALT 38* 03/06/2014   AST 35 03/06/2014   NA 137 03/06/2014   K 3.6 03/06/2014   CL 98 03/06/2014   CREATININE 0.8 03/06/2014   BUN 15 03/06/2014   CO2 29 03/06/2014   TSH 2.19 10/04/2009   HGBA1C 9.2* 03/06/2014   MICROALBUR 0.3 03/06/2014    No results found.     Assessment & Plan:   Problem List Items Addressed This Visit    Essential hypertension    Stable con't norvasc and enalapril      Relevant Orders   Basic metabolic panel   CBC with Differential/Platelet   POCT urinalysis dipstick   Diabetes mellitus type II, uncontrolled    Cont levemir and victoza con't metformin       Relevant Medications   Insulin Detemir (LEVEMIR FLEXTOUCH) 100 UNIT/ML Pen   Liraglutide (VICTOZA) 18 MG/3ML SOPN    Other Visit Diagnoses    Hyperlipidemia LDL goal <70    -  Primary    Relevant Orders    Hepatic function panel    Lipid panel    POCT urinalysis dipstick    DM (diabetes mellitus) type II uncontrolled, periph vascular disorder        Relevant Medications    Insulin Detemir (LEVEMIR FLEXTOUCH) 100 UNIT/ML Pen    Liraglutide (VICTOZA) 18 MG/3ML SOPN    Other Relevant Orders    Basic metabolic panel    Hemoglobin A1c    Microalbumin / creatinine urine ratio    POCT urinalysis dipstick        Loreen Freud, DO

## 2014-10-24 NOTE — Progress Notes (Signed)
Pre visit review using our clinic review tool, if applicable. No additional management support is needed unless otherwise documented below in the visit note. 

## 2014-10-24 NOTE — Assessment & Plan Note (Signed)
Cont levemir and victoza con't metformin

## 2014-10-24 NOTE — Patient Instructions (Signed)
Diabetes and Standards of Medical Care Diabetes is complicated. You may find that your diabetes team includes a dietitian, nurse, diabetes educator, eye doctor, and more. To help everyone know what is going on and to help you get the care you deserve, the following schedule of care was developed to help keep you on track. Below are the tests, exams, vaccines, medicines, education, and plans you will need. HbA1c test This test shows how well you have controlled your glucose over the past 2-3 months. It is used to see if your diabetes management plan needs to be adjusted.   It is performed at least 2 times a year if you are meeting treatment goals.  It is performed 4 times a year if therapy has changed or if you are not meeting treatment goals. Blood pressure test  This test is performed at every routine medical visit. The goal is less than 140/90 mm Hg for most people, but 130/80 mm Hg in some cases. Ask your health care provider about your goal. Dental exam  Follow up with the dentist regularly. Eye exam  If you are diagnosed with type 1 diabetes as a child, get an exam upon reaching the age of 37 years or older and have had diabetes for 3-5 years. Yearly eye exams are recommended after that initial eye exam.  If you are diagnosed with type 1 diabetes as an adult, get an exam within 5 years of diagnosis and then yearly.  If you are diagnosed with type 2 diabetes, get an exam as soon as possible after the diagnosis and then yearly. Foot care exam  Visual foot exams are performed at every routine medical visit. The exams check for cuts, injuries, or other problems with the feet.  A comprehensive foot exam should be done yearly. This includes visual inspection as well as assessing foot pulses and testing for loss of sensation.  Check your feet nightly for cuts, injuries, or other problems with your feet. Tell your health care provider if anything is not healing. Kidney function test (urine  microalbumin)  This test is performed once a year.  Type 1 diabetes: The first test is performed 5 years after diagnosis.  Type 2 diabetes: The first test is performed at the time of diagnosis.  A serum creatinine and estimated glomerular filtration rate (eGFR) test is done once a year to assess the level of chronic kidney disease (CKD), if present. Lipid profile (cholesterol, HDL, LDL, triglycerides)  Performed every 5 years for most people.  The goal for LDL is less than 100 mg/dL. If you are at high risk, the goal is less than 70 mg/dL.  The goal for HDL is 40 mg/dL-50 mg/dL for men and 50 mg/dL-60 mg/dL for women. An HDL cholesterol of 60 mg/dL or higher gives some protection against heart disease.  The goal for triglycerides is less than 150 mg/dL. Influenza vaccine, pneumococcal vaccine, and hepatitis B vaccine  The influenza vaccine is recommended yearly.  It is recommended that people with diabetes who are over 24 years old get the pneumonia vaccine. In some cases, two separate shots may be given. Ask your health care provider if your pneumonia vaccination is up to date.  The hepatitis B vaccine is also recommended for adults with diabetes. Diabetes self-management education  Education is recommended at diagnosis and ongoing as needed. Treatment plan  Your treatment plan is reviewed at every medical visit. Document Released: 06/22/2009 Document Revised: 01/09/2014 Document Reviewed: 01/25/2013 [facility] Patient Information 2015 [place],  [employer]. This information is not intended to replace advice given to you by your health care provider. Make sure you discuss any questions you have with your health care provider.  

## 2014-10-24 NOTE — Assessment & Plan Note (Signed)
Stable con't norvasc and enalapril

## 2014-10-25 ENCOUNTER — Telehealth: Payer: Self-pay

## 2014-10-25 ENCOUNTER — Other Ambulatory Visit: Payer: BLUE CROSS/BLUE SHIELD

## 2014-10-25 ENCOUNTER — Other Ambulatory Visit: Payer: Self-pay | Admitting: *Deleted

## 2014-10-25 ENCOUNTER — Telehealth: Payer: Self-pay | Admitting: *Deleted

## 2014-10-25 NOTE — Telephone Encounter (Signed)
-----   Message from Yvonne R Lowne, DO sent at 10/24/2014 10:42 PM EST ----- Ca elevated----  We need to get a fasting PTH DM not controlled---- add levemir 10 u in am and con't 50 in pm Recheck 3 months---lipid, hep, bmp, hgba1c --hyperlipidemia, dm uncontrolled Cholesterol is great! 

## 2014-10-25 NOTE — Telephone Encounter (Signed)
Discussed with patient and she stated that she is only taking 40 units due to the amount of pens, if she takes more she will have to pay 2 co pays. She said she is on a Low Carb diet currently to help and is hoping this will help bring her levels down, she is will to try 50 units for 1 week to see if she will run out of medication sooner but she knows if she does 60 units daily she will need additional pens and will have to pay an additional co-pay and she does not want to do that.       KP

## 2014-10-25 NOTE — Telephone Encounter (Signed)
-----   Message from Lelon PerlaYvonne R Lowne, DO sent at 10/24/2014 10:42 PM EST ----- Ca elevated----  We need to get a fasting PTH DM not controlled---- add levemir 10 u in am and con't 50 in pm Recheck 3 months---lipid, hep, bmp, hgba1c --hyperlipidemia, dm uncontrolled Cholesterol is great!

## 2014-10-25 NOTE — Telephone Encounter (Signed)
-----   Message from Arnette NorrisKimberly P Payne, [place] sent at 10/25/2014  9:35 AM EST ----- Can we add PTH?

## 2014-10-25 NOTE — Telephone Encounter (Signed)
PTH was added.//AB/CMA

## 2014-10-25 NOTE — Addendum Note (Signed)
Addended by: Verdie ShireBAYNES, ANGELA M on: 10/25/2014 12:04 PM   Modules accepted: Orders

## 2014-10-26 LAB — PARATHYROID HORMONE, INTACT (NO CA): PTH: 24 pg/mL (ref 14–64)

## 2014-10-26 NOTE — Telephone Encounter (Signed)
She is taking 40 units, did you still want to increase to 10 in the am and 50 in the pm?       KP

## 2014-10-26 NOTE — Telephone Encounter (Signed)
yes

## 2014-10-26 NOTE — Telephone Encounter (Signed)
lmom @ [phone] (Home) *Preferred*

## 2014-10-26 NOTE — Telephone Encounter (Signed)
If we inc the dose with pharmacy she will get more pens with her copay

## 2014-10-30 MED ORDER — INSULIN DETEMIR 100 UNIT/ML FLEXPEN: PEN_INJECTOR | SUBCUTANEOUS | Status: DC

## 2014-10-30 NOTE — Telephone Encounter (Signed)
Spoke with the pharmacy and if the patient too 60 units she would have to get an additional box and would have to pay 2 co-pays. Per Dr. Reynolds BowlLown the patient can take the 50 units, the patient is ok with that.      KP

## 2015-01-19 ENCOUNTER — Other Ambulatory Visit: Payer: Self-pay | Admitting: Family Medicine

## 2015-01-31 ENCOUNTER — Telehealth: Payer: Self-pay

## 2015-01-31 NOTE — Telephone Encounter (Signed)
Patient declines mammograph at this time

## 2015-02-06 ENCOUNTER — Telehealth: Payer: Self-pay | Admitting: *Deleted

## 2015-02-06 NOTE — Telephone Encounter (Signed)
Prior authorization for Victoza initiated. Awaiting determination. JG//CMA

## 2015-02-06 NOTE — Telephone Encounter (Signed)
PA approved effective until 02/06/2016. JG//CMA

## 2015-02-19 ENCOUNTER — Other Ambulatory Visit: Payer: Self-pay | Admitting: Family Medicine

## 2015-04-14 ENCOUNTER — Other Ambulatory Visit: Payer: Self-pay | Admitting: Family Medicine

## 2015-04-17 ENCOUNTER — Other Ambulatory Visit: Payer: Self-pay | Admitting: Family Medicine

## 2015-04-17 NOTE — Telephone Encounter (Signed)
Patient is due for a 6 mo follow up with Fasting labs. Please schedule.     KP

## 2015-04-20 NOTE — Telephone Encounter (Signed)
LVM advising patient to schedule appointment  °

## 2015-05-11 ENCOUNTER — Telehealth: Payer: Self-pay | Admitting: Family Medicine

## 2015-05-11 MED ORDER — LIRAGLUTIDE 18 MG/3ML ~~LOC~~ SOPN: PEN_INJECTOR | SUBCUTANEOUS | Status: DC

## 2015-05-11 NOTE — Telephone Encounter (Signed)
Pt needing refill on Liraglutide (VICTOZA) 18 MG/3ML SOPN. She has 2 left. Scheduled appt for 06/12/15. Send to [facility] on [address].

## 2015-05-11 NOTE — Telephone Encounter (Signed)
Rx faxed.    KP 

## 2015-06-12 ENCOUNTER — Encounter: Payer: Self-pay | Admitting: Family Medicine

## 2015-06-12 ENCOUNTER — Ambulatory Visit (INDEPENDENT_AMBULATORY_CARE_PROVIDER_SITE_OTHER): Payer: 59 | Admitting: Family Medicine

## 2015-06-12 VITALS — BP 144/86 | HR 86 | Temp 98.4°F | Wt 243.2 lb

## 2015-06-12 DIAGNOSIS — E1151 Type 2 diabetes mellitus with diabetic peripheral angiopathy without gangrene: Secondary | ICD-10-CM | POA: Diagnosis not present

## 2015-06-12 DIAGNOSIS — Z23 Encounter for immunization: Secondary | ICD-10-CM | POA: Diagnosis not present

## 2015-06-12 DIAGNOSIS — E785 Hyperlipidemia, unspecified: Secondary | ICD-10-CM

## 2015-06-12 DIAGNOSIS — IMO0002 Reserved for concepts with insufficient information to code with codable children: Secondary | ICD-10-CM

## 2015-06-12 DIAGNOSIS — I10 Essential (primary) hypertension: Secondary | ICD-10-CM

## 2015-06-12 DIAGNOSIS — E1165 Type 2 diabetes mellitus with hyperglycemia: Secondary | ICD-10-CM | POA: Diagnosis not present

## 2015-06-12 LAB — COMPREHENSIVE METABOLIC PANEL
ALK PHOS: 62 U/L (ref 39–117)
ALT: 36 U/L — AB (ref 0–35)
AST: 29 U/L (ref 0–37)
Albumin: 4.6 g/dL (ref 3.5–5.2)
BUN: 15 mg/dL (ref 6–23)
CO2: 31 meq/L (ref 19–32)
Calcium: 10.5 mg/dL (ref 8.4–10.5)
Chloride: 98 mEq/L (ref 96–112)
Creatinine, Ser: 0.77 mg/dL (ref 0.40–1.20)
GFR: 80.61 mL/min (ref >60.00)
Glucose, Bld: 138 mg/dL — ABNORMAL HIGH (ref 70–99)
Potassium: 3.9 mEq/L (ref 3.5–5.1)
SODIUM: 139 meq/L (ref 135–145)
TOTAL PROTEIN: 7.6 g/dL (ref 6.0–8.3)
Total Bilirubin: 0.6 mg/dL (ref 0.2–1.2)

## 2015-06-12 LAB — LIPID PANEL
CHOL/HDL RATIO: 3
CHOLESTEROL: 176 mg/dL (ref 0–200)
HDL: 54.7 mg/dL (ref >39.00)
LDL CALC: 82 mg/dL (ref 0–99)
NonHDL: 121.05
TRIGLYCERIDES: 193 mg/dL — AB (ref 0.0–149.0)
VLDL: 38.6 mg/dL (ref 0.0–40.0)

## 2015-06-12 LAB — POCT URINALYSIS DIPSTICK
Bilirubin, UA: NEGATIVE
GLUCOSE UA: NEGATIVE
Ketones, UA: NEGATIVE
LEUKOCYTES UA: NEGATIVE
NITRITE UA: NEGATIVE
Protein, UA: NEGATIVE
RBC UA: NEGATIVE
Spec Grav, UA: 1.015
UROBILINOGEN UA: 0.2
pH, UA: 6

## 2015-06-12 LAB — HEPATIC FUNCTION PANEL
ALT: 36 U/L — ABNORMAL HIGH (ref 0–35)
AST: 29 U/L (ref 0–37)
Albumin: 4.6 g/dL (ref 3.5–5.2)
Alkaline Phosphatase: 62 U/L (ref 39–117)
BILIRUBIN DIRECT: 0.1 mg/dL (ref 0.0–0.3)
BILIRUBIN TOTAL: 0.6 mg/dL (ref 0.2–1.2)
Total Protein: 7.6 g/dL (ref 6.0–8.3)

## 2015-06-12 LAB — HEMOGLOBIN A1C: Hgb A1c MFr Bld: 8.1 % — ABNORMAL HIGH (ref 4.6–6.5)

## 2015-06-12 LAB — MICROALBUMIN / CREATININE URINE RATIO
CREATININE, U: 22.4 mg/dL
Microalb Creat Ratio: 3.1 mg/g (ref 0.0–30.0)
Microalb, Ur: <0.7 mg/dL (ref 0.0–1.9)

## 2015-06-12 NOTE — Progress Notes (Signed)
Pre visit review using our clinic review tool, if applicable. No additional management support is needed unless otherwise documented below in the visit note. 

## 2015-06-12 NOTE — Assessment & Plan Note (Signed)
levemir con't victoza And metformin Check labs con't exercise and diet

## 2015-06-12 NOTE — Patient Instructions (Signed)

## 2015-06-12 NOTE — Progress Notes (Signed)
Patient ID: Jane Young, female    DOB: 1950/12/29  Age: 64 y.o. MRN: [id number]    Subjective:  Subjective HPI Jane Young presents for f/u dm, bp and cholesterol.  She is not checking her bp or glucose.  She also admits to eating poorly.  No other complaints.     Review of Systems  Constitutional: Negative for diaphoresis, appetite change, fatigue and unexpected weight change.  Eyes: Negative for pain, redness and visual disturbance.  Respiratory: Negative for cough, chest tightness, shortness of breath and wheezing.   Cardiovascular: Negative for chest pain, palpitations and leg swelling.  Endocrine: Negative for cold intolerance, heat intolerance, polydipsia, polyphagia and polyuria.  Genitourinary: Negative for dysuria, frequency and difficulty urinating.  Neurological: Negative for dizziness, light-headedness, numbness and headaches.    History Past Medical History  Diagnosis Date  . Hyperlipidemia   . Hypertension   . Diabetes mellitus     Type 2    She has past surgical history that includes Ovarian cyst removal; Cesarean section (1995); and Appendectomy.   Her family history includes Cancer in her father; Hyperlipidemia in her mother; Hypertension in her mother.She reports that she has never smoked. She has never used smokeless tobacco. She reports that she drinks alcohol. She reports that she does not use illicit drugs.  Current Outpatient Prescriptions on File Prior to Visit  Medication Sig Dispense Refill  . amLODipine (NORVASC) 10 MG tablet Take 1 tablet (10 mg total) by mouth daily. Office visit due now 90 tablet 0  . atorvastatin (LIPITOR) 20 MG tablet take 1 tablet by mouth once daily 90 tablet 1  . B-D UF III MINI PEN NEEDLES 31G X 5 MM MISC use as directed 100 each 2  . calcium carbonate (OS-CAL) 600 MG TABS Take 600 mg by mouth daily.      . enalapril (VASOTEC) 10 MG tablet take 1 tablet by mouth twice a day 180 tablet 0  . furosemide (LASIX) 20 MG tablet  take 1 tablet by mouth once daily 90 tablet 0  . Insulin Detemir (LEVEMIR FLEXTOUCH) 100 UNIT/ML Pen Take 50 units at bedtime 15 mL 5  . Liraglutide (VICTOZA) 18 MG/3ML SOPN inject 1.8 milligram subcutaneously daily 27 mL 1  . metFORMIN (GLUCOPHAGE) 1000 MG tablet take 1 tablet by mouth twice a day with food 180 tablet 1  . metoprolol tartrate (LOPRESSOR) 25 MG tablet take 1 tablet by mouth three times a day 270 tablet 0  . Multiple Vitamin (MULTIVITAMIN) tablet Take 1 tablet by mouth daily.       No current facility-administered medications on file prior to visit.     Objective:  Objective Physical Exam  Constitutional: She is oriented to person, place, and time. She appears well-developed and well-nourished.  HENT:  Head: Normocephalic and atraumatic.  Eyes: Conjunctivae and EOM are normal.  Neck: Normal range of motion. Neck supple. No JVD present. Carotid bruit is not present. No thyromegaly present.  Cardiovascular: Normal rate, regular rhythm and normal heart sounds.   No murmur heard. Pulmonary/Chest: Effort normal and breath sounds normal. No respiratory distress. She has no wheezes. She has no rales. She exhibits no tenderness.  Musculoskeletal: Normal range of motion. She exhibits no edema.  Neurological: She is alert and oriented to person, place, and time.  Psychiatric: She has a normal mood and affect.   BP 144/86 mmHg  Pulse 86  Temp(Src) 98.4 F (36.9 C) (Oral)  Wt 243 lb 3.2 oz (110.315 kg)  SpO2  98% Wt Readings from Last 3 Encounters:  06/12/15 243 lb 3.2 oz (110.315 kg)  10/24/14 237 lb (107.502 kg)  03/06/14 243 lb (110.224 kg)     Lab Results  Component Value Date   WBC 6.0 10/24/2014   HGB 13.9 10/24/2014   HCT 41.6 10/24/2014   PLT 230.0 10/24/2014   GLUCOSE 138* 06/12/2015   CHOL 176 06/12/2015   TRIG 193.0* 06/12/2015   HDL 54.70 06/12/2015   LDLDIRECT 114.0 07/25/2013   LDLCALC 82 06/12/2015   ALT 36* 06/12/2015   ALT 36* 06/12/2015   AST  29 06/12/2015   AST 29 06/12/2015   NA 139 06/12/2015   K 3.9 06/12/2015   CL 98 06/12/2015   CREATININE 0.77 06/12/2015   BUN 15 06/12/2015   CO2 31 06/12/2015   TSH 2.19 10/04/2009   HGBA1C 8.1* 06/12/2015   MICROALBUR <0.7 06/12/2015    No results found.   Assessment & Plan:  Plan I am having Jane Young maintain her calcium carbonate, multivitamin, B-D UF III MINI PEN NEEDLES, Insulin Detemir, metFORMIN, atorvastatin, amLODipine, furosemide, enalapril, metoprolol tartrate, and Liraglutide.  No orders of the defined types were placed in this encounter.    Problem List Items Addressed This Visit    Essential hypertension   Relevant Orders   Hepatic function panel (Completed)   Lipid panel (Completed)   Microalbumin / creatinine urine ratio (Completed)   POCT urinalysis dipstick (Completed)    Other Visit Diagnoses    DM (diabetes mellitus) type II uncontrolled, periph vascular disorder ([place])    -  Primary    Relevant Orders    Hemoglobin A1c (Completed)    Hepatic function panel (Completed)    Lipid panel (Completed)    Microalbumin / creatinine urine ratio (Completed)    POCT urinalysis dipstick (Completed)    Hyperlipidemia        Relevant Orders    Comp Met (CMET) (Completed)    Hepatic function panel (Completed)    Lipid panel (Completed)    Microalbumin / creatinine urine ratio (Completed)    POCT urinalysis dipstick (Completed)    Need for shingles vaccine        Relevant Orders    Varicella-zoster vaccine subcutaneous (Completed)       Follow-up: Return in about 6 months (around 12/11/2015), or if symptoms worsen or fail to improve, for hypertension, hyperlipidemia, diabetes II.  Garnet Koyanagi, DO

## 2015-06-12 NOTE — Assessment & Plan Note (Signed)
con't lipitor Check labs  Encouraged diet and exercise

## 2015-06-12 NOTE — Assessment & Plan Note (Signed)
con't enalapril  con't lasix and metoprolol stable

## 2015-06-25 ENCOUNTER — Other Ambulatory Visit: Payer: Self-pay

## 2015-06-25 MED ORDER — INSULIN DETEMIR 100 UNIT/ML FLEXPEN: PEN_INJECTOR | SUBCUTANEOUS | Status: DC

## 2015-07-09 ENCOUNTER — Other Ambulatory Visit: Payer: Self-pay | Admitting: Family Medicine

## 2015-07-17 ENCOUNTER — Other Ambulatory Visit: Payer: Self-pay | Admitting: Family Medicine

## 2015-07-19 ENCOUNTER — Other Ambulatory Visit: Payer: Self-pay

## 2015-07-19 MED ORDER — LIRAGLUTIDE 18 MG/3ML ~~LOC~~ SOPN: PEN_INJECTOR | SUBCUTANEOUS | Status: DC

## 2015-08-28 ENCOUNTER — Telehealth: Payer: Self-pay | Admitting: Behavioral Health

## 2015-08-28 NOTE — Telephone Encounter (Signed)
The following care gaps were assessed: A1C less than 8% (per last OV note on 06/12/15, the patient is to return around 12/11/15 for repeat labs) BP Mammogram  Pap (per health maintenance; patient declined) Flu (per health maintenance; patient declined)  Attempted to reach the patient to discuss the above care gaps and schedule a nurse visit. Left message for patient to return call when available.

## 2015-09-11 ENCOUNTER — Other Ambulatory Visit: Payer: Self-pay | Admitting: Family Medicine

## 2015-11-17 ENCOUNTER — Other Ambulatory Visit: Payer: Self-pay | Admitting: Family Medicine

## 2015-11-19 NOTE — Telephone Encounter (Signed)
Please scheduled the patient a Follow up. She will need to be fasting. Thanks

## 2015-11-19 NOTE — Telephone Encounter (Signed)
Left message on VM 3/13 for patient to call and schedule Follow-Up with Dr. Laury AxonLowne

## 2015-11-28 ENCOUNTER — Other Ambulatory Visit: Payer: Self-pay

## 2015-11-28 MED ORDER — FUROSEMIDE 20 MG PO TABS: 20.0000 mg | ORAL_TABLET | Freq: Every day | ORAL | Status: DC

## 2015-11-28 NOTE — Telephone Encounter (Signed)
Patient scheduled for 01/15/2016 and states pharmacy never received Rx for lasix.

## 2016-01-11 ENCOUNTER — Other Ambulatory Visit: Payer: Self-pay

## 2016-01-11 MED ORDER — METOPROLOL TARTRATE 25 MG PO TABS: 25.0000 mg | ORAL_TABLET | Freq: Three times a day (TID) | ORAL | Status: DC

## 2016-01-11 MED ORDER — AMLODIPINE BESYLATE 10 MG PO TABS: 10.0000 mg | ORAL_TABLET | Freq: Every day | ORAL | Status: DC

## 2016-01-11 MED ORDER — ENALAPRIL MALEATE 10 MG PO TABS: 10.0000 mg | ORAL_TABLET | Freq: Two times a day (BID) | ORAL | Status: DC

## 2016-01-15 ENCOUNTER — Other Ambulatory Visit: Payer: Self-pay

## 2016-01-15 ENCOUNTER — Ambulatory Visit (INDEPENDENT_AMBULATORY_CARE_PROVIDER_SITE_OTHER): Payer: BLUE CROSS/BLUE SHIELD | Admitting: Family Medicine

## 2016-01-15 ENCOUNTER — Encounter: Payer: Self-pay | Admitting: Family Medicine

## 2016-01-15 VITALS — BP 138/84 | HR 83 | Temp 99.3°F | Ht 64.0 in | Wt 241.0 lb

## 2016-01-15 DIAGNOSIS — E785 Hyperlipidemia, unspecified: Secondary | ICD-10-CM

## 2016-01-15 DIAGNOSIS — I10 Essential (primary) hypertension: Secondary | ICD-10-CM | POA: Diagnosis not present

## 2016-01-15 DIAGNOSIS — E1151 Type 2 diabetes mellitus with diabetic peripheral angiopathy without gangrene: Secondary | ICD-10-CM | POA: Diagnosis not present

## 2016-01-15 DIAGNOSIS — E1165 Type 2 diabetes mellitus with hyperglycemia: Secondary | ICD-10-CM | POA: Diagnosis not present

## 2016-01-15 DIAGNOSIS — IMO0002 Reserved for concepts with insufficient information to code with codable children: Secondary | ICD-10-CM

## 2016-01-15 DIAGNOSIS — Z1159 Encounter for screening for other viral diseases: Secondary | ICD-10-CM

## 2016-01-15 LAB — COMPREHENSIVE METABOLIC PANEL
ALBUMIN: 4.9 g/dL (ref 3.5–5.2)
ALK PHOS: 59 U/L (ref 39–117)
ALT: 49 U/L — AB (ref 0–35)
AST: 41 U/L — ABNORMAL HIGH (ref 0–37)
BILIRUBIN TOTAL: 0.8 mg/dL (ref 0.2–1.2)
BUN: 15 mg/dL (ref 6–23)
CALCIUM: 10.8 mg/dL — AB (ref 8.4–10.5)
CO2: 29 mEq/L (ref 19–32)
Chloride: 97 mEq/L (ref 96–112)
Creatinine, Ser: 0.87 mg/dL (ref 0.40–1.20)
GFR: 69.88 mL/min (ref >60.00)
Glucose, Bld: 178 mg/dL — ABNORMAL HIGH (ref 70–99)
Potassium: 4 mEq/L (ref 3.5–5.1)
Sodium: 139 mEq/L (ref 135–145)
TOTAL PROTEIN: 7.8 g/dL (ref 6.0–8.3)

## 2016-01-15 LAB — CBC WITH DIFFERENTIAL/PLATELET
BASOS ABS: 0 10*3/uL (ref 0.0–0.1)
Basophils Relative: 0.8 % (ref 0.0–3.0)
Eosinophils Absolute: 0.3 10*3/uL (ref 0.0–0.7)
Eosinophils Relative: 4.6 % (ref 0.0–5.0)
HCT: 43.9 % (ref 36.0–46.0)
Hemoglobin: 14.5 g/dL (ref 12.0–15.0)
LYMPHS ABS: 3 10*3/uL (ref 0.7–4.0)
Lymphocytes Relative: 48.5 % — ABNORMAL HIGH (ref 12.0–46.0)
MCHC: 33.1 g/dL (ref 30.0–36.0)
MCV: 85.1 fl (ref 78.0–100.0)
MONOS PCT: 6.5 % (ref 3.0–12.0)
Monocytes Absolute: 0.4 10*3/uL (ref 0.1–1.0)
NEUTROS PCT: 39.6 % — AB (ref 43.0–77.0)
Neutro Abs: 2.4 10*3/uL (ref 1.4–7.7)
Platelets: 234 10*3/uL (ref 150.0–400.0)
RBC: 5.16 Mil/uL — AB (ref 3.87–5.11)
RDW: 13.4 % (ref 11.5–15.5)
WBC: 6.1 10*3/uL (ref 4.0–10.5)

## 2016-01-15 LAB — MICROALBUMIN / CREATININE URINE RATIO
CREATININE, U: 32.9 mg/dL
Microalb Creat Ratio: 2.1 mg/g (ref 0.0–30.0)

## 2016-01-15 LAB — LIPID PANEL
CHOL/HDL RATIO: 3
CHOLESTEROL: 188 mg/dL (ref 0–200)
HDL: 56.8 mg/dL (ref >39.00)
LDL Cholesterol: 97 mg/dL (ref 0–99)
NonHDL: 131.53
TRIGLYCERIDES: 173 mg/dL — AB (ref 0.0–149.0)
VLDL: 34.6 mg/dL (ref 0.0–40.0)

## 2016-01-15 LAB — HEMOGLOBIN A1C: Hgb A1c MFr Bld: 9.5 % — ABNORMAL HIGH (ref 4.6–6.5)

## 2016-01-15 MED ORDER — INSULIN DETEMIR 100 UNIT/ML FLEXPEN: PEN_INJECTOR | SUBCUTANEOUS | Status: DC

## 2016-01-15 MED ORDER — LIRAGLUTIDE 18 MG/3ML ~~LOC~~ SOPN: PEN_INJECTOR | SUBCUTANEOUS | Status: DC

## 2016-01-15 MED ORDER — FUROSEMIDE 20 MG PO TABS: 20.0000 mg | ORAL_TABLET | Freq: Every day | ORAL | Status: DC

## 2016-01-15 MED ORDER — ATORVASTATIN CALCIUM 20 MG PO TABS: 20.0000 mg | ORAL_TABLET | Freq: Every day | ORAL | Status: DC

## 2016-01-15 MED ORDER — METFORMIN HCL 1000 MG PO TABS: 1000.0000 mg | ORAL_TABLET | Freq: Two times a day (BID) | ORAL | Status: DC

## 2016-01-15 MED ORDER — ENALAPRIL MALEATE 10 MG PO TABS: 10.0000 mg | ORAL_TABLET | Freq: Two times a day (BID) | ORAL | Status: DC

## 2016-01-15 MED ORDER — METOPROLOL TARTRATE 25 MG PO TABS: 25.0000 mg | ORAL_TABLET | Freq: Three times a day (TID) | ORAL | Status: DC

## 2016-01-15 NOTE — Patient Instructions (Signed)

## 2016-01-15 NOTE — Progress Notes (Signed)
Patient ID: Jane Young, female    DOB: 1951-05-04  Age: 65 y.o. MRN: [id number]    Subjective:  Subjective HPI Jane Young presents for f/u dm, hyperlipidemia, htn.    Review of Systems  Constitutional: Negative for diaphoresis, appetite change, fatigue and unexpected weight change.  Eyes: Negative for pain, redness and visual disturbance.  Respiratory: Negative for cough, chest tightness, shortness of breath and wheezing.   Cardiovascular: Negative for chest pain, palpitations and leg swelling.  Endocrine: Negative for cold intolerance, heat intolerance, polydipsia, polyphagia and polyuria.  Genitourinary: Negative for dysuria, frequency and difficulty urinating.  Neurological: Negative for dizziness, light-headedness, numbness and headaches.    History Past Medical History  Diagnosis Date  . Hyperlipidemia   . Hypertension   . Diabetes mellitus     Type 2    She has past surgical history that includes Ovarian cyst removal; Cesarean section (1995); and Appendectomy.   Her family history includes Cancer in her father; Hyperlipidemia in her mother; Hypertension in her mother.She reports that she has never smoked. She has never used smokeless tobacco. She reports that she drinks alcohol. She reports that she does not use illicit drugs.  Current Outpatient Prescriptions on File Prior to Visit  Medication Sig Dispense Refill  . amLODipine (NORVASC) 10 MG tablet Take 1 tablet (10 mg total) by mouth daily. 90 tablet 0  . B-D UF III MINI PEN NEEDLES 31G X 5 MM MISC use as directed 100 each 2  . calcium carbonate (OS-CAL) 600 MG TABS Take 600 mg by mouth daily.      . Multiple Vitamin (MULTIVITAMIN) tablet Take 1 tablet by mouth daily.       No current facility-administered medications on file prior to visit.     Objective:  Objective Physical Exam  Constitutional: She is oriented to person, place, and time. She appears well-developed and well-nourished.  HENT:  Head:  Normocephalic and atraumatic.  Eyes: Conjunctivae and EOM are normal.  Neck: Normal range of motion. Neck supple. No JVD present. Carotid bruit is not present. No thyromegaly present.  Cardiovascular: Normal rate, regular rhythm and normal heart sounds.   No murmur heard. Pulmonary/Chest: Effort normal and breath sounds normal. No respiratory distress. She has no wheezes. She has no rales. She exhibits no tenderness.  Musculoskeletal: She exhibits no edema.  Neurological: She is alert and oriented to person, place, and time.  Psychiatric: She has a normal mood and affect.  Nursing note and vitals reviewed.  BP 138/84 mmHg  Pulse 83  Temp(Src) 99.3 F (37.4 C) (Oral)  Ht 5\' 4"  (1.626 m)  Wt 241 lb (109.317 kg)  BMI 41.35 kg/m2  SpO2 98% Wt Readings from Last 3 Encounters:  01/15/16 241 lb (109.317 kg)  06/12/15 243 lb 3.2 oz (110.315 kg)  10/24/14 237 lb (107.502 kg)     Lab Results  Component Value Date   WBC 6.1 01/15/2016   HGB 14.5 01/15/2016   HCT 43.9 01/15/2016   PLT 234.0 01/15/2016   GLUCOSE 178* 01/15/2016   CHOL 188 01/15/2016   TRIG 173.0* 01/15/2016   HDL 56.80 01/15/2016   LDLDIRECT 114.0 07/25/2013   LDLCALC 97 01/15/2016   ALT 49* 01/15/2016   AST 41* 01/15/2016   NA 139 01/15/2016   K 4.0 01/15/2016   CL 97 01/15/2016   CREATININE 0.87 01/15/2016   BUN 15 01/15/2016   CO2 29 01/15/2016   TSH 2.19 10/04/2009   HGBA1C 9.5* 01/15/2016   MICROALBUR <0.7  01/15/2016    No results found.   Assessment & Plan:  Plan I have discontinued Ms. Meinders's Insulin Detemir. I have also changed her atorvastatin and Insulin Detemir. Additionally, I am having her maintain her calcium carbonate, multivitamin, B-D UF III MINI PEN NEEDLES, amLODipine, metoprolol tartrate, metFORMIN, Liraglutide, furosemide, and enalapril.  Meds ordered this encounter  Medications  . metoprolol tartrate (LOPRESSOR) 25 MG tablet    Sig: Take 1 tablet (25 mg total) by mouth 3 (three)  times daily.    Dispense:  93 tablet    Refill:  5  . metFORMIN (GLUCOPHAGE) 1000 MG tablet    Sig: Take 1 tablet (1,000 mg total) by mouth 2 (two) times daily with a meal. Repeat labs are due now    Dispense:  62 tablet    Refill:  5  . Liraglutide (VICTOZA) 18 MG/3ML SOPN    Sig: inject 1.8 milligram subcutaneously daily    Dispense:  27 mL    Refill:  2  . DISCONTD: Insulin Detemir (LEVEMIR FLEXTOUCH) 100 UNIT/ML Pen    Sig: 10 units in am and 50 units at night    Dispense:  15 mL    Refill:  5    D/C PREVIOUS SCRIPTS FOR THIS MEDICATION  . furosemide (LASIX) 20 MG tablet    Sig: Take 1 tablet (20 mg total) by mouth daily.    Dispense:  60 tablet    Refill:  5  . enalapril (VASOTEC) 10 MG tablet    Sig: Take 1 tablet (10 mg total) by mouth 2 (two) times daily.    Dispense:  62 tablet    Refill:  5  . atorvastatin (LIPITOR) 20 MG tablet    Sig: Take 1 tablet (20 mg total) by mouth daily.    Dispense:  31 tablet    Refill:  5  . Insulin Detemir (LEVEMIR FLEXTOUCH) 100 UNIT/ML Pen    Sig: 10 units in am and 45 units at night    Dispense:  15 mL    Refill:  5    D/C PREVIOUS SCRIPTS FOR THIS MEDICATION    Problem List Items Addressed This Visit      Unprioritized   DM (diabetes mellitus) type II uncontrolled, periph vascular disorder (HCC) - Primary   Relevant Medications   metoprolol tartrate (LOPRESSOR) 25 MG tablet   metFORMIN (GLUCOPHAGE) 1000 MG tablet   Liraglutide (VICTOZA) 18 MG/3ML SOPN   furosemide (LASIX) 20 MG tablet   enalapril (VASOTEC) 10 MG tablet   atorvastatin (LIPITOR) 20 MG tablet   Insulin Detemir (LEVEMIR FLEXTOUCH) 100 UNIT/ML Pen   Other Relevant Orders   Hemoglobin A1c (Completed)   Microalbumin / creatinine urine ratio (Completed)   Essential hypertension   Relevant Medications   metoprolol tartrate (LOPRESSOR) 25 MG tablet   furosemide (LASIX) 20 MG tablet   enalapril (VASOTEC) 10 MG tablet   atorvastatin (LIPITOR) 20 MG tablet   Other  Relevant Orders   Comprehensive metabolic panel (Completed)   CBC with Differential/Platelet (Completed)   POCT urinalysis dipstick    Other Visit Diagnoses    Hyperlipidemia        Relevant Medications    metoprolol tartrate (LOPRESSOR) 25 MG tablet    furosemide (LASIX) 20 MG tablet    enalapril (VASOTEC) 10 MG tablet    atorvastatin (LIPITOR) 20 MG tablet    Other Relevant Orders    Comprehensive metabolic panel (Completed)    Lipid panel (Completed)  Need for hepatitis C screening test        Relevant Orders    Hepatitis C antibody       Follow-up: Return in about 6 months (around 07/17/2016), or if symptoms worsen or fail to improve, for hypertension, hyperlipidemia, diabetes II.  Donato Schultz, DO

## 2016-01-15 NOTE — Progress Notes (Signed)
Pre visit review using our clinic review tool, if applicable. No additional management support is needed unless otherwise documented below in the visit note. 

## 2016-01-16 LAB — HEPATITIS C ANTIBODY: HCV Ab: NEGATIVE

## 2016-04-18 ENCOUNTER — Other Ambulatory Visit: Payer: Self-pay | Admitting: Family Medicine

## 2016-05-14 ENCOUNTER — Other Ambulatory Visit: Payer: Self-pay | Admitting: Family Medicine

## 2016-07-15 ENCOUNTER — Other Ambulatory Visit: Payer: Self-pay | Admitting: Family Medicine

## 2016-07-15 DIAGNOSIS — E785 Hyperlipidemia, unspecified: Secondary | ICD-10-CM

## 2016-08-18 ENCOUNTER — Other Ambulatory Visit: Payer: Self-pay | Admitting: Family Medicine

## 2016-09-19 ENCOUNTER — Other Ambulatory Visit: Payer: Self-pay | Admitting: Family Medicine

## 2016-10-17 ENCOUNTER — Other Ambulatory Visit: Payer: Self-pay | Admitting: Family Medicine

## 2016-10-17 DIAGNOSIS — IMO0002 Reserved for concepts with insufficient information to code with codable children: Secondary | ICD-10-CM

## 2016-10-17 DIAGNOSIS — E1151 Type 2 diabetes mellitus with diabetic peripheral angiopathy without gangrene: Secondary | ICD-10-CM

## 2016-10-17 DIAGNOSIS — E1165 Type 2 diabetes mellitus with hyperglycemia: Secondary | ICD-10-CM

## 2016-10-17 DIAGNOSIS — I10 Essential (primary) hypertension: Secondary | ICD-10-CM

## 2016-10-20 ENCOUNTER — Other Ambulatory Visit: Payer: Self-pay | Admitting: Family Medicine

## 2016-10-20 DIAGNOSIS — E1165 Type 2 diabetes mellitus with hyperglycemia: Principal | ICD-10-CM

## 2016-10-20 DIAGNOSIS — E1151 Type 2 diabetes mellitus with diabetic peripheral angiopathy without gangrene: Secondary | ICD-10-CM

## 2016-10-20 DIAGNOSIS — IMO0002 Reserved for concepts with insufficient information to code with codable children: Secondary | ICD-10-CM

## 2016-10-20 NOTE — Telephone Encounter (Signed)
30 day supply of metformin, metoprolol and enalapril sent to pharmacy. Pt was due for f/u with Dr Zola ButtonLowne Chase in 07/2016 and is past due. She needs to be seen for further refills. Thanks!

## 2016-10-23 NOTE — Telephone Encounter (Signed)
Message left on voicemail @ [phone] for patient to call the office to schedule OV.

## 2016-11-21 ENCOUNTER — Encounter: Payer: Self-pay | Admitting: Family Medicine

## 2016-11-21 ENCOUNTER — Other Ambulatory Visit: Payer: Self-pay | Admitting: Family Medicine

## 2016-11-21 ENCOUNTER — Ambulatory Visit (INDEPENDENT_AMBULATORY_CARE_PROVIDER_SITE_OTHER): Payer: BLUE CROSS/BLUE SHIELD | Admitting: Family Medicine

## 2016-11-21 DIAGNOSIS — E1151 Type 2 diabetes mellitus with diabetic peripheral angiopathy without gangrene: Secondary | ICD-10-CM | POA: Diagnosis not present

## 2016-11-21 DIAGNOSIS — E1165 Type 2 diabetes mellitus with hyperglycemia: Secondary | ICD-10-CM | POA: Diagnosis not present

## 2016-11-21 DIAGNOSIS — I10 Essential (primary) hypertension: Secondary | ICD-10-CM | POA: Diagnosis not present

## 2016-11-21 DIAGNOSIS — E785 Hyperlipidemia, unspecified: Secondary | ICD-10-CM | POA: Diagnosis not present

## 2016-11-21 DIAGNOSIS — IMO0002 Reserved for concepts with insufficient information to code with codable children: Secondary | ICD-10-CM

## 2016-11-21 LAB — LIPID PANEL
Cholesterol: 188 mg/dL (ref 0–200)
HDL: 55.5 mg/dL (ref >39.00)
LDL Cholesterol: 100 mg/dL — ABNORMAL HIGH (ref 0–99)
NONHDL: 132.38
Total CHOL/HDL Ratio: 3
Triglycerides: 164 mg/dL — ABNORMAL HIGH (ref 0.0–149.0)
VLDL: 32.8 mg/dL (ref 0.0–40.0)

## 2016-11-21 LAB — COMPREHENSIVE METABOLIC PANEL
ALT: 45 U/L — AB (ref 0–35)
AST: 36 U/L (ref 0–37)
Albumin: 4.7 g/dL (ref 3.5–5.2)
Alkaline Phosphatase: 58 U/L (ref 39–117)
BUN: 16 mg/dL (ref 6–23)
CO2: 29 meq/L (ref 19–32)
Calcium: 11.2 mg/dL — ABNORMAL HIGH (ref 8.4–10.5)
Chloride: 95 mEq/L — ABNORMAL LOW (ref 96–112)
Creatinine, Ser: 0.86 mg/dL (ref 0.40–1.20)
GFR: 70.63 mL/min (ref >60.00)
GLUCOSE: 139 mg/dL — AB (ref 70–99)
Potassium: 3.6 mEq/L (ref 3.5–5.1)
SODIUM: 135 meq/L (ref 135–145)
TOTAL PROTEIN: 7.7 g/dL (ref 6.0–8.3)
Total Bilirubin: 0.6 mg/dL (ref 0.2–1.2)

## 2016-11-21 LAB — HEMOGLOBIN A1C: HEMOGLOBIN A1C: 8.7 % — AB (ref 4.6–6.5)

## 2016-11-21 MED ORDER — ATORVASTATIN CALCIUM 20 MG PO TABS: 20.0000 mg | ORAL_TABLET | Freq: Every day | ORAL | 1 refills | Status: DC

## 2016-11-21 MED ORDER — FUROSEMIDE 20 MG PO TABS: 20.0000 mg | ORAL_TABLET | Freq: Every day | ORAL | 1 refills | Status: DC

## 2016-11-21 MED ORDER — METFORMIN HCL 1000 MG PO TABS: ORAL_TABLET | ORAL | 1 refills | Status: DC

## 2016-11-21 MED ORDER — ENALAPRIL MALEATE 10 MG PO TABS: 10.0000 mg | ORAL_TABLET | Freq: Two times a day (BID) | ORAL | 1 refills | Status: DC

## 2016-11-21 MED ORDER — METOPROLOL TARTRATE 25 MG PO TABS: 25.0000 mg | ORAL_TABLET | Freq: Three times a day (TID) | ORAL | 1 refills | Status: DC

## 2016-11-21 MED ORDER — INSULIN DETEMIR 100 UNIT/ML FLEXPEN: PEN_INJECTOR | SUBCUTANEOUS | 1 refills | Status: DC

## 2016-11-21 MED ORDER — INSULIN PEN NEEDLE 31G X 5 MM MISC: 1 refills | Status: DC

## 2016-11-21 MED ORDER — LIRAGLUTIDE 18 MG/3ML ~~LOC~~ SOPN: PEN_INJECTOR | SUBCUTANEOUS | 1 refills | Status: DC

## 2016-11-21 NOTE — Assessment & Plan Note (Signed)
Tolerating statin, encouraged heart healthy diet, avoid trans fats, minimize simple carbs and saturated fats. Increase exercise as tolerated 

## 2016-11-21 NOTE — Patient Instructions (Addendum)
Please do not forget to call for your eye appointment.  Also call your insurance about what machine and test strips they will pay for.  Carbohydrate Counting for Diabetes Mellitus, Adult Carbohydrate counting is a method for keeping track of how many carbohydrates you eat. Eating carbohydrates naturally increases the amount of sugar (glucose) in the blood. Counting how many carbohydrates you eat helps keep your blood glucose within normal limits, which helps you manage your diabetes (diabetes mellitus). It is important to know how many carbohydrates you can safely have in each meal. This is different for every person. A diet and nutrition specialist (registered dietitian) can help you make a meal plan and calculate how many carbohydrates you should have at each meal and snack. Carbohydrates are found in the following foods:  Grains, such as breads and cereals.  Dried beans and soy products.  Starchy vegetables, such as potatoes, peas, and corn.  Fruit and fruit juices.  Milk and yogurt.  Sweets and snack foods, such as cake, cookies, candy, chips, and soft drinks. How do I count carbohydrates? There are two ways to count carbohydrates in food. You can use either of the methods or a combination of both. Reading "Nutrition Facts" on packaged food  The "Nutrition Facts" list is included on the labels of almost all packaged foods and beverages in the [place] It includes:  The serving size.  Information about nutrients in each serving, including the grams (g) of carbohydrate per serving. To use the "Nutrition Facts":  Decide how many servings you will have.  Multiply the number of servings by the number of carbohydrates per serving.  The resulting number is the total amount of carbohydrates that you will be having. Learning standard serving sizes of other foods  When you eat foods containing carbohydrates that are not packaged or do not include "Nutrition Facts" on the label, you need to  measure the servings in order to count the amount of carbohydrates:  Measure the foods that you will eat with a food scale or measuring cup, if needed.  Decide how many standard-size servings you will eat.  Multiply the number of servings by 15. Most carbohydrate-rich foods have about 15 g of carbohydrates per serving.  For example, if you eat 8 oz (170 g) of strawberries, you will have eaten 2 servings and 30 g of carbohydrates (2 servings x 15 g = 30 g).  For foods that have more than one food mixed, such as soups and casseroles, you must count the carbohydrates in each food that is included. The following list contains standard serving sizes of common carbohydrate-rich foods. Each of these servings has about 15 g of carbohydrates:   hamburger bun or  English muffin.   oz (15 mL) syrup.   oz (14 g) jelly.  1 slice of bread.  1 six-inch tortilla.  3 oz (85 g) cooked rice or pasta.  4 oz (113 g) cooked dried beans.  4 oz (113 g) starchy vegetable, such as peas, corn, or potatoes.  4 oz (113 g) hot cereal.  4 oz (113 g) mashed potatoes or  of a large baked potato.  4 oz (113 g) canned or frozen fruit.  4 oz (120 mL) fruit juice.  4-6 crackers.  6 chicken nuggets.  6 oz (170 g) unsweetened dry cereal.  6 oz (170 g) plain fat-free yogurt or yogurt sweetened with artificial sweeteners.  8 oz (240 mL) milk.  8 oz (170 g) fresh fruit or one small  piece of fruit.  24 oz (680 g) popped popcorn. Example of carbohydrate counting Sample meal   3 oz (85 g) chicken breast.  6 oz (170 g) brown rice.  4 oz (113 g) corn.  8 oz (240 mL) milk.  8 oz (170 g) strawberries with sugar-free whipped topping. Carbohydrate calculation  1. Identify the foods that contain carbohydrates:  Rice.  Corn.  Milk.  Strawberries. 2. Calculate how many servings you have of each food:  2 servings rice.  1 serving corn.  1 serving milk.  1 serving  strawberries. 3. Multiply each number of servings by 15 g:  2 servings rice x 15 g = 30 g.  1 serving corn x 15 g = 15 g.  1 serving milk x 15 g = 15 g.  1 serving strawberries x 15 g = 15 g. 4. Add together all of the amounts to find the total grams of carbohydrates eaten:  30 g + 15 g + 15 g + 15 g = 75 g of carbohydrates total. This information is not intended to replace advice given to you by your health care provider. Make sure you discuss any questions you have with your health care provider. Document Released: 08/25/2005 Document Revised: 03/14/2016 Document Reviewed: 02/06/2016 Elsevier Interactive Patient Education  2017 [employer]

## 2016-11-21 NOTE — Assessment & Plan Note (Addendum)
hgba1c not acceptable, minimize simple carbs. Increase exercise as tolerated. Continue current meds Pt non compliant with office visits and meds Check labs Pt is not checking her blood sugar

## 2016-11-21 NOTE — Assessment & Plan Note (Signed)
Poorly controlled will alter medications, encouraged DASH diet, minimize caffeine and obtain adequate sleep. Report concerning symptoms and follow up as directed and as needed 

## 2016-11-21 NOTE — Progress Notes (Signed)
Patient ID: Jane Young, female   DOB: 12-22-1950, 66 y.o.   MRN: [id number]     Subjective:  I acted as a Neurosurgeon for Dr. Zola Young.  Jane Young, CMA   Patient ID: Jane Young, female    DOB: 1951/08/28, 66 y.o.   MRN: [id number]  Chief Complaint  Patient presents with  . Hypertension  . Hyperlipidemia  . Diabetes    HPI  Patient is in today for follow up blood pressure, cholesterol, and diabetes.  Does not check sugar daily.  Patient Care Team: Jane Schultz, DO as PCP - General   Past Medical History:  Diagnosis Date  . Diabetes mellitus    Type 2  . Hyperlipidemia   . Hypertension     Past Surgical History:  Procedure Laterality Date  . APPENDECTOMY    . CESAREAN SECTION  1995  . OVARIAN CYST REMOVAL      Family History  Problem Relation Age of Onset  . Hyperlipidemia Mother   . Hypertension Mother   . Cancer Father     kidney    Social History   Social History  . Marital status: Married    Spouse name: N/A  . Number of children: N/A  . Years of education: N/A   Occupational History  . Not on file.   Social History Main Topics  . Smoking status: Never Smoker  . Smokeless tobacco: Never Used  . Alcohol use Yes  . Drug use: No  . Sexual activity: Not on file   Other Topics Concern  . Not on file   Social History Narrative  . No narrative on file    Outpatient Medications Prior to Visit  Medication Sig Dispense Refill  . amLODipine (NORVASC) 10 MG tablet take 1 tablet by mouth once daily 30 tablet 0  . calcium carbonate (OS-CAL) 600 MG TABS Take 600 mg by mouth daily.      . Multiple Vitamin (MULTIVITAMIN) tablet Take 1 tablet by mouth daily.      Marland Kitchen atorvastatin (LIPITOR) 20 MG tablet take 1 tablet by mouth once daily 31 tablet 5  . B-D UF III MINI PEN NEEDLES 31G X 5 MM MISC use as directed 100 each 2  . enalapril (VASOTEC) 10 MG tablet take 1 tablet by mouth twice a day 62 tablet 0  . furosemide (LASIX) 20 MG tablet Take 1 tablet  (20 mg total) by mouth daily. 60 tablet 5  . Insulin Detemir (LEVEMIR FLEXTOUCH) 100 UNIT/ML Pen 10 units in am and 45 units at night 15 mL 5  . metFORMIN (GLUCOPHAGE) 1000 MG tablet take 1 tablet by mouth twice a day with meals *REPEAT LABS ARE DUE NOW* 62 tablet 0  . metoprolol tartrate (LOPRESSOR) 25 MG tablet take 1 tablet by mouth three times a day 93 tablet 0  . VICTOZA 18 MG/3ML SOPN inject 1.8 milligram subcutaneously daily 27 mL 0   No facility-administered medications prior to visit.     No Known Allergies  Review of Systems  Constitutional: Negative for chills, fever and malaise/fatigue.  HENT: Negative for congestion and hearing loss.   Eyes: Negative for discharge.  Respiratory: Negative for cough, sputum production and shortness of breath.   Cardiovascular: Negative for chest pain, palpitations and leg swelling.  Gastrointestinal: Negative for abdominal pain, blood in stool, constipation, diarrhea, heartburn, nausea and vomiting.  Genitourinary: Negative for dysuria, frequency, hematuria and urgency.  Musculoskeletal: Negative for back pain, falls and myalgias.  Skin: Negative for  rash.  Neurological: Negative for dizziness, sensory change, loss of consciousness, weakness and headaches.  Endo/Heme/Allergies: Negative for environmental allergies. Does not bruise/bleed easily.  Psychiatric/Behavioral: Negative for depression and suicidal ideas. The patient is not nervous/anxious and does not have insomnia.        Objective:    Physical Exam  Constitutional: She is oriented to person, place, and time. She appears well-developed and well-nourished.  HENT:  Head: Normocephalic and atraumatic.  Eyes: Conjunctivae and EOM are normal.  Neck: Normal range of motion. Neck supple. No JVD present. Carotid bruit is not present. No thyromegaly present.  Cardiovascular: Normal rate, regular rhythm and normal heart sounds.   No murmur heard. Pulmonary/Chest: Effort normal and  breath sounds normal. No respiratory distress. She has no wheezes. She has no rales. She exhibits no tenderness.  Musculoskeletal: She exhibits no edema.  Neurological: She is alert and oriented to person, place, and time.  Psychiatric: She has a normal mood and affect. Her behavior is normal. Judgment and thought content normal.  Nursing note and vitals reviewed.   BP 138/82 (BP Location: Left Arm, Cuff Size: Large)   Pulse 80   Temp 98.1 F (36.7 C) (Oral)   Resp 16   Ht 5' 2.7" (1.593 m)   Wt 235 lb (106.6 kg)   SpO2 97%   BMI 42.03 kg/m  Wt Readings from Last 3 Encounters:  11/21/16 235 lb (106.6 kg)  01/15/16 241 lb (109.3 kg)  06/12/15 243 lb 3.2 oz (110.3 kg)      Immunization History  Administered Date(s) Administered  . Pneumococcal Polysaccharide-23 10/28/2011  . Tdap 10/28/2011  . Zoster 06/12/2015    Health Maintenance  Topic Date Due  . HIV Screening  12/27/1967  . OPHTHALMOLOGY EXAM  06/24/2012  . HEMOGLOBIN A1C  07/17/2016  . PNEUMOCOCCAL POLYSACCHARIDE VACCINE (2) 10/27/2016  . MAMMOGRAM  01/14/2017 (Originally 10/05/2011)  . PAP SMEAR  01/14/2017 (Originally 10/27/2014)  . INFLUENZA VACCINE  12/07/2026 (Originally 04/08/2016)  . FOOT EXAM  11/21/2017  . COLONOSCOPY  10/05/2019  . TETANUS/TDAP  10/27/2021  . Hepatitis C Screening  Completed    Lab Results  Component Value Date   WBC 6.1 01/15/2016   HGB 14.5 01/15/2016   HCT 43.9 01/15/2016   PLT 234.0 01/15/2016   GLUCOSE 178 (H) 01/15/2016   CHOL 188 01/15/2016   TRIG 173.0 (H) 01/15/2016   HDL 56.80 01/15/2016   LDLDIRECT 114.0 07/25/2013   LDLCALC 97 01/15/2016   ALT 49 (H) 01/15/2016   AST 41 (H) 01/15/2016   NA 139 01/15/2016   K 4.0 01/15/2016   CL 97 01/15/2016   CREATININE 0.87 01/15/2016   BUN 15 01/15/2016   CO2 29 01/15/2016   TSH 2.19 10/04/2009   HGBA1C 9.5 (H) 01/15/2016   MICROALBUR <0.7 01/15/2016    Lab Results  Component Value Date   TSH 2.19 10/04/2009   Lab  Results  Component Value Date   WBC 6.1 01/15/2016   HGB 14.5 01/15/2016   HCT 43.9 01/15/2016   MCV 85.1 01/15/2016   PLT 234.0 01/15/2016   Lab Results  Component Value Date   NA 139 01/15/2016   K 4.0 01/15/2016   CO2 29 01/15/2016   GLUCOSE 178 (H) 01/15/2016   BUN 15 01/15/2016   CREATININE 0.87 01/15/2016   BILITOT 0.8 01/15/2016   ALKPHOS 59 01/15/2016   AST 41 (H) 01/15/2016   ALT 49 (H) 01/15/2016   PROT 7.8 01/15/2016   ALBUMIN  4.9 01/15/2016   CALCIUM 10.8 (H) 01/15/2016   GFR 69.88 01/15/2016   Lab Results  Component Value Date   CHOL 188 01/15/2016   Lab Results  Component Value Date   HDL 56.80 01/15/2016   Lab Results  Component Value Date   LDLCALC 97 01/15/2016   Lab Results  Component Value Date   TRIG 173.0 (H) 01/15/2016   Lab Results  Component Value Date   CHOLHDL 3 01/15/2016   Lab Results  Component Value Date   HGBA1C 9.5 (H) 01/15/2016         Assessment & Plan:   Problem List Items Addressed This Visit      Unprioritized   DM (diabetes mellitus) type II uncontrolled, periph vascular disorder (HCC)    hgba1c not acceptable, minimize simple carbs. Increase exercise as tolerated. Continue current meds Pt non compliant with office visits and meds Check labs Pt is not checking her blood sugar       Relevant Medications   enalapril (VASOTEC) 10 MG tablet   metoprolol tartrate (LOPRESSOR) 25 MG tablet   metFORMIN (GLUCOPHAGE) 1000 MG tablet   atorvastatin (LIPITOR) 20 MG tablet   liraglutide (VICTOZA) 18 MG/3ML SOPN   Insulin Detemir (LEVEMIR FLEXTOUCH) 100 UNIT/ML Pen   furosemide (LASIX) 20 MG tablet   Other Relevant Orders   Hemoglobin A1c   Essential hypertension    Poorly controlled will alter medications, encouraged DASH diet, minimize caffeine and obtain adequate sleep. Report concerning symptoms and follow up as directed and as needed      Relevant Medications   enalapril (VASOTEC) 10 MG tablet   metoprolol  tartrate (LOPRESSOR) 25 MG tablet   atorvastatin (LIPITOR) 20 MG tablet   furosemide (LASIX) 20 MG tablet   Other Relevant Orders   Comprehensive metabolic panel    Other Visit Diagnoses    Hyperlipidemia, unspecified hyperlipidemia type       Relevant Medications   enalapril (VASOTEC) 10 MG tablet   metoprolol tartrate (LOPRESSOR) 25 MG tablet   atorvastatin (LIPITOR) 20 MG tablet   furosemide (LASIX) 20 MG tablet   Other Relevant Orders   Lipid panel      I have changed Ms. Allen's VICTOZA to liraglutide and B-D UF III MINI PEN NEEDLES to Insulin Pen Needle. I have also changed her enalapril, metoprolol tartrate, metFORMIN, and atorvastatin. I am also having her maintain her calcium carbonate, multivitamin, amLODipine, Insulin Detemir, and furosemide.  Meds ordered this encounter  Medications  . enalapril (VASOTEC) 10 MG tablet    Sig: Take 1 tablet (10 mg total) by mouth 2 (two) times daily.    Dispense:  180 tablet    Refill:  1    Patient needs office visit for further refills.  . metoprolol tartrate (LOPRESSOR) 25 MG tablet    Sig: Take 1 tablet (25 mg total) by mouth 3 (three) times daily.    Dispense:  270 tablet    Refill:  1    Patient needs office visit for further refills.  . metFORMIN (GLUCOPHAGE) 1000 MG tablet    Sig: take 1 tablet by mouth twice a day with meals.    Dispense:  180 tablet    Refill:  1    Patient needs office visit for further refills.  Marland Kitchen atorvastatin (LIPITOR) 20 MG tablet    Sig: Take 1 tablet (20 mg total) by mouth daily.    Dispense:  90 tablet    Refill:  1  . liraglutide (  VICTOZA) 18 MG/3ML SOPN    Sig: inject 1.8 milligram subcutaneously daily    Dispense:  27 mL    Refill:  1  . Insulin Detemir (LEVEMIR FLEXTOUCH) 100 UNIT/ML Pen    Sig: 10 units in am and 45 units at night    Dispense:  45 mL    Refill:  1    D/C PREVIOUS SCRIPTS FOR THIS MEDICATION  . furosemide (LASIX) 20 MG tablet    Sig: Take 1 tablet (20 mg total) by  mouth daily.    Dispense:  90 tablet    Refill:  1  . Insulin Pen Needle (B-D UF III MINI PEN NEEDLES) 31G X 5 MM MISC    Sig: use as directed    Dispense:  300 each    Refill:  1    CMA served as scribe during this visit. History, Physical and Plan performed by medical provider. Documentation and orders reviewed and attested to.  Jane Schultz, DO

## 2016-11-21 NOTE — Progress Notes (Signed)
Pre visit review using our clinic review tool, if applicable. No additional management support is needed unless otherwise documented below in the visit note. 

## 2016-11-27 ENCOUNTER — Other Ambulatory Visit: Payer: Self-pay | Admitting: Family Medicine

## 2016-11-27 DIAGNOSIS — IMO0002 Reserved for concepts with insufficient information to code with codable children: Secondary | ICD-10-CM

## 2016-11-27 DIAGNOSIS — E1151 Type 2 diabetes mellitus with diabetic peripheral angiopathy without gangrene: Secondary | ICD-10-CM

## 2016-11-27 DIAGNOSIS — E1165 Type 2 diabetes mellitus with hyperglycemia: Principal | ICD-10-CM

## 2016-11-27 MED ORDER — INSULIN DETEMIR 100 UNIT/ML FLEXPEN: PEN_INJECTOR | SUBCUTANEOUS | 1 refills | Status: DC

## 2016-12-19 ENCOUNTER — Other Ambulatory Visit: Payer: Self-pay | Admitting: Family Medicine

## 2016-12-22 MED ORDER — AMLODIPINE BESYLATE 10 MG PO TABS
10.0000 mg | ORAL_TABLET | Freq: Every day | ORAL | 3 refills | Status: DC
Start: 2016-12-22 — End: 2017-12-14

## 2016-12-22 NOTE — Addendum Note (Signed)
Addended by: Scharlene Gloss B on: 12/22/2016 10:10 AM   Modules accepted: Orders

## 2017-05-12 ENCOUNTER — Telehealth: Payer: Self-pay | Admitting: Family Medicine

## 2017-05-12 DIAGNOSIS — E1151 Type 2 diabetes mellitus with diabetic peripheral angiopathy without gangrene: Secondary | ICD-10-CM

## 2017-05-12 DIAGNOSIS — IMO0002 Reserved for concepts with insufficient information to code with codable children: Secondary | ICD-10-CM

## 2017-05-12 DIAGNOSIS — I10 Essential (primary) hypertension: Secondary | ICD-10-CM

## 2017-05-12 DIAGNOSIS — E1165 Type 2 diabetes mellitus with hyperglycemia: Secondary | ICD-10-CM

## 2017-05-12 NOTE — Telephone Encounter (Signed)
Was advised to F/U in June/must call and sched OV first/thx dmf

## 2017-05-14 MED ORDER — METOPROLOL TARTRATE 25 MG PO TABS: 25.0000 mg | ORAL_TABLET | Freq: Three times a day (TID) | ORAL | 0 refills | Status: DC

## 2017-05-14 MED ORDER — ENALAPRIL MALEATE 10 MG PO TABS: 10.0000 mg | ORAL_TABLET | Freq: Two times a day (BID) | ORAL | 0 refills | Status: DC

## 2017-05-14 MED ORDER — METFORMIN HCL 1000 MG PO TABS
ORAL_TABLET | ORAL | 0 refills | Status: DC
Start: 2017-05-14 — End: 2017-06-03

## 2017-05-14 NOTE — Telephone Encounter (Signed)
Pt called in and scheduled ov for medication f/u, pt is unable to come in any sooner than scheduled which is the beginning of October. Pt says that she need her medication because she will be going out of town.   Please advise.

## 2017-05-14 NOTE — Telephone Encounter (Signed)
Faxed medications till OV/thx dmf

## 2017-05-21 ENCOUNTER — Other Ambulatory Visit: Payer: Self-pay | Admitting: Family Medicine

## 2017-05-21 DIAGNOSIS — E1165 Type 2 diabetes mellitus with hyperglycemia: Secondary | ICD-10-CM

## 2017-05-21 DIAGNOSIS — E1151 Type 2 diabetes mellitus with diabetic peripheral angiopathy without gangrene: Secondary | ICD-10-CM

## 2017-05-21 DIAGNOSIS — IMO0002 Reserved for concepts with insufficient information to code with codable children: Secondary | ICD-10-CM

## 2017-05-21 DIAGNOSIS — I10 Essential (primary) hypertension: Secondary | ICD-10-CM

## 2017-06-03 ENCOUNTER — Ambulatory Visit (INDEPENDENT_AMBULATORY_CARE_PROVIDER_SITE_OTHER): Payer: BLUE CROSS/BLUE SHIELD | Admitting: Physician Assistant

## 2017-06-03 ENCOUNTER — Encounter: Payer: Self-pay | Admitting: Physician Assistant

## 2017-06-03 VITALS — BP 160/76 | HR 94 | Temp 99.1°F | Resp 18 | Ht 62.7 in | Wt 238.2 lb

## 2017-06-03 DIAGNOSIS — R030 Elevated blood-pressure reading, without diagnosis of hypertension: Secondary | ICD-10-CM

## 2017-06-03 DIAGNOSIS — J029 Acute pharyngitis, unspecified: Secondary | ICD-10-CM | POA: Diagnosis not present

## 2017-06-03 LAB — POCT RAPID STREP A (OFFICE): Rapid Strep A Screen: NEGATIVE

## 2017-06-03 MED ORDER — AMOXICILLIN 500 MG PO CAPS: 500.0000 mg | ORAL_CAPSULE | Freq: Two times a day (BID) | ORAL | 0 refills | Status: AC

## 2017-06-03 MED ORDER — HYDROCODONE-HOMATROPINE 5-1.5 MG/5ML PO SYRP
5.0000 mL | ORAL_SOLUTION | Freq: Three times a day (TID) | ORAL | 0 refills | Status: DC | PRN
Start: 2017-06-03 — End: 2017-06-11

## 2017-06-03 NOTE — Patient Instructions (Addendum)
Be sure to stay well hydrated - drink 2 liters of water daily.  Warm tea with honey and lemon will help sore throat. You can also gargle liquid Benadryl. See below for more home care tips.  Hycodan is to help you sleep. Take this as directed at night before bed.  Come back if you are not improving in 5 days.   Thank you for coming in today. I hope you feel we met your needs.  Feel free to call PCP if you have any questions or further requests.  Please consider signing up for MyChart if you do not already have it, as this is a great way to communicate with me.  Best,  Whitney McVey, PA-C  Pharyngitis Pharyngitis is redness, pain, and swelling (inflammation) of your pharynx. What are the causes? Pharyngitis is usually caused by infection. Most of the time, these infections are from viruses (viral) and are part of a cold. However, sometimes pharyngitis is caused by bacteria (bacterial). Pharyngitis can also be caused by allergies. Viral pharyngitis may be spread from person to person by coughing, sneezing, and personal items or utensils (cups, forks, spoons, toothbrushes). Bacterial pharyngitis may be spread from person to person by more intimate contact, such as kissing. What are the signs or symptoms? Symptoms of pharyngitis include:  Sore throat.  Tiredness (fatigue).  Low-grade fever.  Headache.  Joint pain and muscle aches.  Skin rashes.  Swollen lymph nodes.  Plaque-like film on throat or tonsils (often seen with bacterial pharyngitis).  How is this diagnosed? Your health care provider will ask you questions about your illness and your symptoms. Your medical history, along with a physical exam, is often all that is needed to diagnose pharyngitis. Sometimes, a rapid strep test is done. Other lab tests may also be done, depending on the suspected cause. How is this treated? Viral pharyngitis will usually get better in 3-4 days without the use of medicine. Bacterial  pharyngitis is treated with medicines that kill germs (antibiotics). Follow these instructions at home:  Drink enough water and fluids to keep your urine clear or pale yellow.  Only take over-the-counter or prescription medicines as directed by your health care provider: ? If you are prescribed antibiotics, make sure you finish them even if you start to feel better. ? Do not take aspirin.  Get lots of rest.  Gargle with 8 oz of salt water ( tsp of salt per 1 qt of water) as often as every 1-2 hours to soothe your throat.  Throat lozenges (if you are not at risk for choking) or sprays may be used to soothe your throat. Contact a health care provider if:  You have large, tender lumps in your neck.  You have a rash.  You cough up green, yellow-brown, or bloody spit. Get help right away if:  Your neck becomes stiff.  You drool or are unable to swallow liquids.  You vomit or are unable to keep medicines or liquids down.  You have severe pain that does not go away with the use of recommended medicines.  You have trouble breathing (not caused by a stuffy nose). This information is not intended to replace advice given to you by your health care provider. Make sure you discuss any questions you have with your health care provider. Document Released: 08/25/2005 Document Revised: 01/31/2016 Document Reviewed: 05/02/2013 Elsevier Interactive Patient Education  2017 [employer].  IF you received an x-ray today, you will receive an invoice from [facility]. Please  contact [facility] at [phone] with questions or concerns regarding your invoice.   IF you received labwork today, you will receive an invoice from [place]. Please contact [facility] at [phone] with questions or concerns regarding your invoice.   Our billing staff will not be able to assist you with questions regarding bills from these companies.  You will be contacted with the lab results as soon  as they are available. The fastest way to get your results is to activate your My Chart account. Instructions are located on the last page of this paperwork. If you have not heard from [place] regarding the results in 2 weeks, please contact this office.

## 2017-06-03 NOTE — Progress Notes (Signed)
Jane Young  MRN: [id number] DOB: 1950/12/23  PCP: Donato Schultz, DO  Subjective:  Pt is a 66 year old female who presents to clinic for sore throat x 3 days.  Patient complains of sore throat and pain with swallowing. Associated symptoms include enlarged tonsils and sore throat. Not sleeping well due to pain. Onset of symptoms was 3 days ago, and have been rapidly worsening since that time. She is drinking plenty of fluids. She has not had recent close exposure to someone with proven streptococcal pharyngitis.   Review of Systems  Constitutional: Negative for chills, diaphoresis, fatigue and fever.  HENT: Positive for sore throat and trouble swallowing. Negative for congestion, postnasal drip, rhinorrhea, sinus pain and sinus pressure.   Skin: Negative for rash.  Psychiatric/Behavioral: Positive for sleep disturbance.    Patient Active Problem List   Diagnosis Date Noted  . DM (diabetes mellitus) type II uncontrolled, periph vascular disorder (HCC) 10/07/2007  . Hyperlipidemia LDL goal <100 10/07/2007  . Essential hypertension 10/07/2007  . OVARIAN CYST, LEFT 10/07/2007  . CARDIAC MURMUR, HX OF 10/07/2007  . TONSILLECTOMY AND ADENOIDECTOMY, HX OF 10/07/2007    Current Outpatient Prescriptions on File Prior to Visit  Medication Sig Dispense Refill  . amLODipine (NORVASC) 10 MG tablet Take 1 tablet (10 mg total) by mouth daily. 90 tablet 3  . atorvastatin (LIPITOR) 20 MG tablet Take 1 tablet (20 mg total) by mouth daily. 90 tablet 1  . calcium carbonate (OS-CAL) 600 MG TABS Take 600 mg by mouth daily.      . enalapril (VASOTEC) 10 MG tablet Take 1 tablet (10 mg total) by mouth 2 (two) times daily. 180 tablet 0  . enalapril (VASOTEC) 10 MG tablet take 1 tablet by mouth twice a day 180 tablet 1  . furosemide (LASIX) 20 MG tablet Take 1 tablet (20 mg total) by mouth daily. 90 tablet 1  . Insulin Detemir (LEVEMIR FLEXTOUCH) 100 UNIT/ML Pen 20 units in am and 45 units at  night 45 mL 1  . Insulin Pen Needle (B-D UF III MINI PEN NEEDLES) 31G X 5 MM MISC use as directed 300 each 1  . liraglutide (VICTOZA) 18 MG/3ML SOPN inject 1.8 milligram subcutaneously daily 27 mL 1  . metFORMIN (GLUCOPHAGE) 1000 MG tablet take 1 tablet by mouth twice a day with food 180 tablet 1  . metoprolol tartrate (LOPRESSOR) 25 MG tablet take 1 tablet by mouth three times a day 270 tablet 1  . Multiple Vitamin (MULTIVITAMIN) tablet Take 1 tablet by mouth daily.       No current facility-administered medications on file prior to visit.     No Known Allergies   Objective:  BP (!) 162/88   Pulse 94   Temp 99.1 F (37.3 C) (Oral)   Resp 18   Ht 5' 2.7" (1.593 m)   Wt 238 lb 3.2 oz (108 kg)   SpO2 98%   BMI 42.60 kg/m   Physical Exam  Constitutional: She is oriented to person, place, and time and well-developed, well-nourished, and in no distress. No distress.  HENT:  Right Ear: Tympanic membrane normal.  Left Ear: Tympanic membrane normal.  Mouth/Throat: Uvula is midline and mucous membranes are normal. Posterior oropharyngeal edema and posterior oropharyngeal erythema present. No oropharyngeal exudate.  Cardiovascular: Normal rate, regular rhythm and normal heart sounds.   Neurological: She is alert and oriented to person, place, and time. GCS score is 15.  Skin: Skin is warm and  dry.  Psychiatric: Mood, memory, affect and judgment normal.  Vitals reviewed.   Assessment and Plan :  1. Acute pharyngitis, unspecified etiology 2. Sore throat - amoxicillin (AMOXIL) 500 MG capsule; Take 1 capsule (500 mg total) by mouth 2 (two) times daily.  Dispense: 20 capsule; Refill: 0 - POCT rapid strep A - Culture, Group A Strep - HYDROcodone-homatropine (HYCODAN) 5-1.5 MG/5ML syrup; Take 5 mLs by mouth every 8 (eight) hours as needed for cough.  Dispense: 120 mL; Refill: 0 - Rapid strep is negative. Elevated pulse with worsening symptoms. Plan to treat for pharyngitis. Culture is  pending . RTC in 5 days if no improvement.  3. Elevated blood pressure reading - Recheck vitals   Marco Collie, PA-C  Primary Care at [facility] 06/03/2017 11:10 AM

## 2017-06-05 LAB — CULTURE, GROUP A STREP: Strep A Culture: NEGATIVE

## 2017-06-11 ENCOUNTER — Ambulatory Visit (INDEPENDENT_AMBULATORY_CARE_PROVIDER_SITE_OTHER): Payer: BLUE CROSS/BLUE SHIELD | Admitting: Family Medicine

## 2017-06-11 VITALS — BP 120/80 | HR 94 | Temp 99.1°F | Resp 18 | Wt 238.0 lb

## 2017-06-11 DIAGNOSIS — E1151 Type 2 diabetes mellitus with diabetic peripheral angiopathy without gangrene: Secondary | ICD-10-CM | POA: Diagnosis not present

## 2017-06-11 DIAGNOSIS — E785 Hyperlipidemia, unspecified: Secondary | ICD-10-CM | POA: Diagnosis not present

## 2017-06-11 DIAGNOSIS — IMO0002 Reserved for concepts with insufficient information to code with codable children: Secondary | ICD-10-CM

## 2017-06-11 DIAGNOSIS — I10 Essential (primary) hypertension: Secondary | ICD-10-CM

## 2017-06-11 DIAGNOSIS — E1165 Type 2 diabetes mellitus with hyperglycemia: Secondary | ICD-10-CM

## 2017-06-11 LAB — LIPID PANEL
CHOL/HDL RATIO: 3
Cholesterol: 178 mg/dL (ref 0–200)
HDL: 51.1 mg/dL (ref >39.00)
LDL CALC: 97 mg/dL (ref 0–99)
NonHDL: 127.15
Triglycerides: 151 mg/dL — ABNORMAL HIGH (ref 0.0–149.0)
VLDL: 30.2 mg/dL (ref 0.0–40.0)

## 2017-06-11 LAB — POC URINALSYSI DIPSTICK (AUTOMATED)
Bilirubin, UA: NEGATIVE
Glucose, UA: NEGATIVE
Ketones, UA: NEGATIVE
Leukocytes, UA: NEGATIVE
Nitrite, UA: NEGATIVE
PROTEIN UA: NEGATIVE
RBC UA: NEGATIVE
SPEC GRAV UA: 1.015 (ref 1.010–1.025)
UROBILINOGEN UA: 0.2 U/dL
pH, UA: 6 (ref 5.0–8.0)

## 2017-06-11 LAB — COMPREHENSIVE METABOLIC PANEL
ALBUMIN: 4.5 g/dL (ref 3.5–5.2)
ALK PHOS: 71 U/L (ref 39–117)
ALT: 34 U/L (ref 0–35)
AST: 24 U/L (ref 0–37)
BUN: 17 mg/dL (ref 6–23)
CALCIUM: 10.4 mg/dL (ref 8.4–10.5)
CO2: 30 mEq/L (ref 19–32)
CREATININE: 0.87 mg/dL (ref 0.40–1.20)
Chloride: 99 mEq/L (ref 96–112)
GFR: 69.57 mL/min (ref >60.00)
Glucose, Bld: 164 mg/dL — ABNORMAL HIGH (ref 70–99)
Potassium: 4.7 mEq/L (ref 3.5–5.1)
Sodium: 137 mEq/L (ref 135–145)
TOTAL PROTEIN: 7.8 g/dL (ref 6.0–8.3)
Total Bilirubin: 0.6 mg/dL (ref 0.2–1.2)

## 2017-06-11 LAB — MICROALBUMIN / CREATININE URINE RATIO
Creatinine,U: 37.4 mg/dL
Microalb Creat Ratio: 1.9 mg/g (ref 0.0–30.0)

## 2017-06-11 LAB — HEMOGLOBIN A1C: Hgb A1c MFr Bld: 8.7 % — ABNORMAL HIGH (ref 4.6–6.5)

## 2017-06-11 MED ORDER — INSULIN DETEMIR 100 UNIT/ML FLEXPEN: PEN_INJECTOR | SUBCUTANEOUS | 2 refills | Status: DC

## 2017-06-11 NOTE — Progress Notes (Signed)
Patient ID: Jane Young, female    DOB: 1951-03-27  Age: 66 y.o. MRN: [id number]    Subjective:  Subjective  HPI Meeghan Skipper presents for f/u dm, bp and cholesterol.    HYPERTENSION   Blood pressure range-not checking   Chest pain- no      Dyspnea- no Lightheadedness- no   Edema- no  Other side effects - no   Medication compliance: good  Low salt diet- yes    DIABETES    Blood Sugar ranges-not checking   Polyuria- no New Visual problems- no  Hypoglycemic symptoms- no  Other side effects-no Medication compliance - good Last eye exam-  Foot exam- today   HYPERLIPIDEMIA  Medication compliance- good RUQ pain- no  Muscle aches- no Other side effects-no  Review of Systems  Constitutional: Negative for appetite change, diaphoresis, fatigue and unexpected weight change.  Eyes: Negative for pain, redness and visual disturbance.  Respiratory: Negative for cough, chest tightness, shortness of breath and wheezing.   Cardiovascular: Negative for chest pain, palpitations and leg swelling.  Endocrine: Negative for cold intolerance, heat intolerance, polydipsia, polyphagia and polyuria.  Genitourinary: Negative for difficulty urinating, dysuria and frequency.  Neurological: Negative for dizziness, light-headedness, numbness and headaches.    History Past Medical History:  Diagnosis Date  . Diabetes mellitus    Type 2  . Heart murmur   . Hyperlipidemia   . Hypertension     She has a past surgical history that includes Ovarian cyst removal; Cesarean section (1995); and Appendectomy.   Her family history includes Cancer in her father and maternal grandmother; Diabetes in her paternal grandmother; Heart disease in her paternal grandmother; Hyperlipidemia in her mother; Hypertension in her mother.She reports that she has never smoked. She has never used smokeless tobacco. She reports that she drinks alcohol. She reports that she does not use drugs.  Current Outpatient  Prescriptions on File Prior to Visit  Medication Sig Dispense Refill  . amLODipine (NORVASC) 10 MG tablet Take 1 tablet (10 mg total) by mouth daily. 90 tablet 3  . amoxicillin (AMOXIL) 500 MG capsule Take 1 capsule (500 mg total) by mouth 2 (two) times daily. 20 capsule 0  . atorvastatin (LIPITOR) 20 MG tablet Take 1 tablet (20 mg total) by mouth daily. 90 tablet 1  . enalapril (VASOTEC) 10 MG tablet take 1 tablet by mouth twice a day 180 tablet 1  . furosemide (LASIX) 20 MG tablet Take 1 tablet (20 mg total) by mouth daily. 90 tablet 1  . Insulin Pen Needle (B-D UF III MINI PEN NEEDLES) 31G X 5 MM MISC use as directed 300 each 1  . liraglutide (VICTOZA) 18 MG/3ML SOPN inject 1.8 milligram subcutaneously daily 27 mL 1  . metFORMIN (GLUCOPHAGE) 1000 MG tablet take 1 tablet by mouth twice a day with food 180 tablet 1  . metoprolol tartrate (LOPRESSOR) 25 MG tablet take 1 tablet by mouth three times a day 270 tablet 1  . Multiple Vitamin (MULTIVITAMIN) tablet Take 1 tablet by mouth daily.      . calcium carbonate (OS-CAL) 600 MG TABS Take 600 mg by mouth daily.       No current facility-administered medications on file prior to visit.      Objective:  Objective  Physical Exam  Constitutional: She is oriented to person, place, and time. She appears well-developed and well-nourished.  HENT:  Head: Normocephalic and atraumatic.  Eyes: Conjunctivae and EOM are normal.  Neck: Normal range of motion. Neck  supple. No JVD present. Carotid bruit is not present. No thyromegaly present.  Cardiovascular: Normal rate, regular rhythm and normal heart sounds.   No murmur heard. Pulmonary/Chest: Effort normal and breath sounds normal. No respiratory distress. She has no wheezes. She has no rales. She exhibits no tenderness.  Musculoskeletal: She exhibits no edema.  Neurological: She is alert and oriented to person, place, and time.  Psychiatric: She has a normal mood and affect.  Nursing note and vitals  reviewed. Sensory exam of the foot is normal, tested with the monofilament. Good pulses, no lesions or ulcers, good peripheral pulses. There were no vitals taken for this visit. Wt Readings from Last 3 Encounters:  06/03/17 238 lb 3.2 oz (108 kg)  11/21/16 235 lb (106.6 kg)  01/15/16 241 lb (109.3 kg)     Lab Results  Component Value Date   WBC 6.1 01/15/2016   HGB 14.5 01/15/2016   HCT 43.9 01/15/2016   PLT 234.0 01/15/2016   GLUCOSE 139 (H) 11/21/2016   CHOL 188 11/21/2016   TRIG 164.0 (H) 11/21/2016   HDL 55.50 11/21/2016   LDLDIRECT 114.0 07/25/2013   LDLCALC 100 (H) 11/21/2016   ALT 45 (H) 11/21/2016   AST 36 11/21/2016   NA 135 11/21/2016   K 3.6 11/21/2016   CL 95 (L) 11/21/2016   CREATININE 0.86 11/21/2016   BUN 16 11/21/2016   CO2 29 11/21/2016   TSH 2.19 10/04/2009   HGBA1C 8.7 (H) 11/21/2016   MICROALBUR <0.7 01/15/2016    No results found.   Assessment & Plan:  Plan  I have discontinued Ms. Schnick's HYDROcodone-homatropine. I am also having her maintain her calcium carbonate, multivitamin, atorvastatin, liraglutide, furosemide, Insulin Pen Needle, amLODipine, enalapril, metFORMIN, metoprolol tartrate, amoxicillin, and Insulin Detemir.  Meds ordered this encounter  Medications  . Insulin Detemir (LEVEMIR FLEXTOUCH) 100 UNIT/ML Pen    Sig: 20 units in am and 45 units at night    Dispense:  45 mL    Refill:  2    D/C PREVIOUS SCRIPTS FOR THIS MEDICATION    Problem List Items Addressed This Visit      Unprioritized   DM (diabetes mellitus) type II uncontrolled, periph vascular disorder (HCC) - Primary    hgba1c to be checked , minimize simple carbs. Increase exercise as tolerated. Continue current meds      Relevant Medications   Insulin Detemir (LEVEMIR FLEXTOUCH) 100 UNIT/ML Pen   Other Relevant Orders   Hemoglobin A1c   Comprehensive metabolic panel   POCT Urinalysis Dipstick (Automated)   Microalbumin / creatinine urine ratio   Essential  hypertension    Well controlled, no changes to meds. Encouraged heart healthy diet such as the DASH diet and exercise as tolerated.        Relevant Orders   Lipid panel   Hemoglobin A1c   Comprehensive metabolic panel   POCT Urinalysis Dipstick (Automated)   Microalbumin / creatinine urine ratio   Hyperlipidemia LDL goal <100    Tolerating statin, encouraged heart healthy diet, avoid trans fats, minimize simple carbs and saturated fats. Increase exercise as tolerated       Other Visit Diagnoses    Hyperlipidemia LDL goal <70       Relevant Orders   Lipid panel   Comprehensive metabolic panel      Follow-up: Return in about 6 months (around 12/10/2017) for hypertension, hyperlipidemia, diabetes II.  Donato Schultz, DO

## 2017-06-11 NOTE — Patient Instructions (Signed)

## 2017-06-11 NOTE — Assessment & Plan Note (Signed)
hgba1c to be checked, minimize simple carbs. Increase exercise as tolerated. Continue current meds  

## 2017-06-11 NOTE — Assessment & Plan Note (Signed)
Tolerating statin, encouraged heart healthy diet, avoid trans fats, minimize simple carbs and saturated fats. Increase exercise as tolerated 

## 2017-06-11 NOTE — Assessment & Plan Note (Signed)
Well controlled, no changes to meds. Encouraged heart healthy diet such as the DASH diet and exercise as tolerated.  °

## 2017-06-15 ENCOUNTER — Other Ambulatory Visit: Payer: Self-pay | Admitting: Family Medicine

## 2017-06-15 ENCOUNTER — Encounter: Payer: Self-pay | Admitting: Family Medicine

## 2017-06-15 DIAGNOSIS — E785 Hyperlipidemia, unspecified: Secondary | ICD-10-CM

## 2017-06-15 DIAGNOSIS — E119 Type 2 diabetes mellitus without complications: Secondary | ICD-10-CM

## 2017-07-18 ENCOUNTER — Other Ambulatory Visit: Payer: Self-pay | Admitting: Family Medicine

## 2017-07-18 DIAGNOSIS — I10 Essential (primary) hypertension: Secondary | ICD-10-CM

## 2017-07-18 DIAGNOSIS — E785 Hyperlipidemia, unspecified: Secondary | ICD-10-CM

## 2017-07-22 ENCOUNTER — Other Ambulatory Visit: Payer: Self-pay | Admitting: Family Medicine

## 2017-07-22 DIAGNOSIS — IMO0002 Reserved for concepts with insufficient information to code with codable children: Secondary | ICD-10-CM

## 2017-07-22 DIAGNOSIS — E1151 Type 2 diabetes mellitus with diabetic peripheral angiopathy without gangrene: Secondary | ICD-10-CM

## 2017-07-22 DIAGNOSIS — E1165 Type 2 diabetes mellitus with hyperglycemia: Principal | ICD-10-CM

## 2017-11-14 ENCOUNTER — Other Ambulatory Visit: Payer: Self-pay | Admitting: Family Medicine

## 2017-11-14 DIAGNOSIS — I10 Essential (primary) hypertension: Secondary | ICD-10-CM

## 2017-11-14 DIAGNOSIS — E1165 Type 2 diabetes mellitus with hyperglycemia: Secondary | ICD-10-CM

## 2017-11-14 DIAGNOSIS — E1151 Type 2 diabetes mellitus with diabetic peripheral angiopathy without gangrene: Secondary | ICD-10-CM

## 2017-11-14 DIAGNOSIS — IMO0002 Reserved for concepts with insufficient information to code with codable children: Secondary | ICD-10-CM

## 2017-11-28 ENCOUNTER — Other Ambulatory Visit: Payer: Self-pay | Admitting: Family Medicine

## 2017-11-28 DIAGNOSIS — IMO0002 Reserved for concepts with insufficient information to code with codable children: Secondary | ICD-10-CM

## 2017-11-28 DIAGNOSIS — E1151 Type 2 diabetes mellitus with diabetic peripheral angiopathy without gangrene: Secondary | ICD-10-CM

## 2017-11-28 DIAGNOSIS — E1165 Type 2 diabetes mellitus with hyperglycemia: Secondary | ICD-10-CM

## 2017-11-28 DIAGNOSIS — I10 Essential (primary) hypertension: Secondary | ICD-10-CM

## 2017-12-14 ENCOUNTER — Other Ambulatory Visit: Payer: Self-pay | Admitting: Family Medicine

## 2018-01-14 ENCOUNTER — Other Ambulatory Visit: Payer: Self-pay | Admitting: Family Medicine

## 2018-01-14 DIAGNOSIS — E785 Hyperlipidemia, unspecified: Secondary | ICD-10-CM

## 2018-01-14 DIAGNOSIS — I10 Essential (primary) hypertension: Secondary | ICD-10-CM

## 2018-02-08 DIAGNOSIS — H52223 Regular astigmatism, bilateral: Secondary | ICD-10-CM | POA: Diagnosis not present

## 2018-02-08 DIAGNOSIS — H25013 Cortical age-related cataract, bilateral: Secondary | ICD-10-CM | POA: Diagnosis not present

## 2018-02-08 DIAGNOSIS — H2513 Age-related nuclear cataract, bilateral: Secondary | ICD-10-CM | POA: Diagnosis not present

## 2018-02-08 DIAGNOSIS — H5213 Myopia, bilateral: Secondary | ICD-10-CM | POA: Diagnosis not present

## 2018-02-08 DIAGNOSIS — E119 Type 2 diabetes mellitus without complications: Secondary | ICD-10-CM | POA: Diagnosis not present

## 2018-02-08 DIAGNOSIS — H4911 Fourth [trochlear] nerve palsy, right eye: Secondary | ICD-10-CM | POA: Diagnosis not present

## 2018-02-08 DIAGNOSIS — Z794 Long term (current) use of insulin: Secondary | ICD-10-CM | POA: Diagnosis not present

## 2018-02-08 DIAGNOSIS — H524 Presbyopia: Secondary | ICD-10-CM | POA: Diagnosis not present

## 2018-02-10 ENCOUNTER — Other Ambulatory Visit: Payer: Self-pay | Admitting: Family Medicine

## 2018-02-10 DIAGNOSIS — E1165 Type 2 diabetes mellitus with hyperglycemia: Principal | ICD-10-CM

## 2018-02-10 DIAGNOSIS — E1151 Type 2 diabetes mellitus with diabetic peripheral angiopathy without gangrene: Secondary | ICD-10-CM

## 2018-02-10 DIAGNOSIS — IMO0002 Reserved for concepts with insufficient information to code with codable children: Secondary | ICD-10-CM

## 2018-02-12 ENCOUNTER — Other Ambulatory Visit: Payer: Self-pay | Admitting: Family Medicine

## 2018-03-18 ENCOUNTER — Other Ambulatory Visit: Payer: Self-pay | Admitting: Family Medicine

## 2018-04-12 ENCOUNTER — Ambulatory Visit (INDEPENDENT_AMBULATORY_CARE_PROVIDER_SITE_OTHER): Payer: Medicare HMO | Admitting: Family Medicine

## 2018-04-12 ENCOUNTER — Encounter: Payer: Self-pay | Admitting: Family Medicine

## 2018-04-12 VITALS — BP 168/66 | HR 80 | Temp 98.1°F | Resp 16 | Ht 63.0 in | Wt 231.2 lb

## 2018-04-12 DIAGNOSIS — E1165 Type 2 diabetes mellitus with hyperglycemia: Secondary | ICD-10-CM

## 2018-04-12 DIAGNOSIS — E1151 Type 2 diabetes mellitus with diabetic peripheral angiopathy without gangrene: Secondary | ICD-10-CM | POA: Diagnosis not present

## 2018-04-12 DIAGNOSIS — Z23 Encounter for immunization: Secondary | ICD-10-CM | POA: Diagnosis not present

## 2018-04-12 DIAGNOSIS — E785 Hyperlipidemia, unspecified: Secondary | ICD-10-CM

## 2018-04-12 DIAGNOSIS — I10 Essential (primary) hypertension: Secondary | ICD-10-CM | POA: Diagnosis not present

## 2018-04-12 DIAGNOSIS — IMO0002 Reserved for concepts with insufficient information to code with codable children: Secondary | ICD-10-CM

## 2018-04-12 LAB — COMPREHENSIVE METABOLIC PANEL
ALT: 30 U/L (ref 0–35)
AST: 22 U/L (ref 0–37)
Albumin: 4.6 g/dL (ref 3.5–5.2)
Alkaline Phosphatase: 61 U/L (ref 39–117)
BUN: 15 mg/dL (ref 6–23)
CHLORIDE: 102 meq/L (ref 96–112)
CO2: 30 mEq/L (ref 19–32)
Calcium: 10.6 mg/dL — ABNORMAL HIGH (ref 8.4–10.5)
Creatinine, Ser: 0.89 mg/dL (ref 0.40–1.20)
GFR: 67.59 mL/min (ref >60.00)
GLUCOSE: 133 mg/dL — AB (ref 70–99)
POTASSIUM: 5.2 meq/L — AB (ref 3.5–5.1)
SODIUM: 141 meq/L (ref 135–145)
Total Bilirubin: 0.7 mg/dL (ref 0.2–1.2)
Total Protein: 7.2 g/dL (ref 6.0–8.3)

## 2018-04-12 LAB — LIPID PANEL
CHOL/HDL RATIO: 3
Cholesterol: 180 mg/dL (ref 0–200)
HDL: 53.4 mg/dL (ref >39.00)
LDL CALC: 94 mg/dL (ref 0–99)
NONHDL: 126.81
Triglycerides: 163 mg/dL — ABNORMAL HIGH (ref 0.0–149.0)
VLDL: 32.6 mg/dL (ref 0.0–40.0)

## 2018-04-12 LAB — HEMOGLOBIN A1C: HEMOGLOBIN A1C: 7.7 % — AB (ref 4.6–6.5)

## 2018-04-12 MED ORDER — AMLODIPINE BESYLATE 10 MG PO TABS: 10.0000 mg | ORAL_TABLET | Freq: Every day | ORAL | 1 refills | Status: DC

## 2018-04-12 MED ORDER — ATORVASTATIN CALCIUM 20 MG PO TABS: 20.0000 mg | ORAL_TABLET | Freq: Every day | ORAL | 1 refills | Status: DC

## 2018-04-12 MED ORDER — FUROSEMIDE 20 MG PO TABS: 20.0000 mg | ORAL_TABLET | Freq: Every day | ORAL | 1 refills | Status: DC

## 2018-04-12 NOTE — Progress Notes (Signed)
Patient ID: Jane Young, female   DOB: 1950/12/09, 67 y.o.   MRN: [id number]     Subjective:  I acted as a Neurosurgeonscribe for Dr. Zola ButtonLowne-Chase.  Apolonio SchneidersSheketia, CMA   Patient ID: Jane Young, female    DOB: 1950/12/09, 67 y.o.   MRN: [id number]  Chief Complaint  Patient presents with  . Hyperlipidemia  . Hypertension  . Diabetes    HPI  Patient is in today for follow up diabetes, cholesterol, and blood pressure.  HYPERTENSION   Blood pressure range-not checking   Chest pain- no       Dyspnea- no Lightheadedness- no   Edema- no  Other side effects - no   Medication compliance: good Low salt diet- yes    DIABETES     Blood Sugar ranges-not checking   Polyuria- no New Visual problems- no  Hypoglycemic symptoms- no  Other side effects-no Medication compliance - good Last eye exam- due Foot exam- today   HYPERLIPIDEMIA  Medication compliance- good RUQ pain- no  Muscle aches- no Other side effects-no    Patient Care Team: Donato SchultzLowne Chase, Yvonne R, DO as PCP - General   Past Medical History:  Diagnosis Date  . Diabetes mellitus    Type 2  . Heart murmur   . Hyperlipidemia   . Hypertension     Past Surgical History:  Procedure Laterality Date  . APPENDECTOMY    . CESAREAN SECTION  1995  . OVARIAN CYST REMOVAL      Family History  Problem Relation Age of Onset  . Hyperlipidemia Mother   . Hypertension Mother   . Cancer Father        kidney  . Cancer Maternal Grandmother   . Diabetes Paternal Grandmother   . Heart disease Paternal Grandmother     Social History   Socioeconomic History  . Marital status: Married    Spouse name: Not on file  . Number of children: Not on file  . Years of education: Not on file  . Highest education level: Not on file  Occupational History  . Not on file  Social Needs  . Financial resource strain: Not on file  . Food insecurity:    Worry: Not on file    Inability: Not on file  . Transportation needs:    Medical: Not on file      Non-medical: Not on file  Tobacco Use  . Smoking status: Never Smoker  . Smokeless tobacco: Never Used  Substance and Sexual Activity  . Alcohol use: Yes  . Drug use: No  . Sexual activity: Not on file  Lifestyle  . Physical activity:    Days per week: Not on file    Minutes per session: Not on file  . Stress: Not on file  Relationships  . Social connections:    Talks on phone: Not on file    Gets together: Not on file    Attends religious service: Not on file    Active member of club or organization: Not on file    Attends meetings of clubs or organizations: Not on file    Relationship status: Not on file  . Intimate partner violence:    Fear of current or ex partner: Not on file    Emotionally abused: Not on file    Physically abused: Not on file    Forced sexual activity: Not on file  Other Topics Concern  . Not on file  Social History Narrative  . Not on file  Outpatient Medications Prior to Visit  Medication Sig Dispense Refill  . enalapril (VASOTEC) 10 MG tablet TAKE 1 TABLET BY MOUTH TWICE A DAY 180 tablet 0  . Insulin Pen Needle (B-D UF III MINI PEN NEEDLES) 31G X 5 MM MISC use as directed 300 each 1  . LEVEMIR FLEXTOUCH 100 UNIT/ML Pen INJECT 20 UNITS SUBCUTANEOUSLY EVERY MORNING AND 45 UNITS AT NIGHT 45 mL 0  . liraglutide (VICTOZA) 18 MG/3ML SOPN INJECT 1.8 MILLIGRAM SUBCUTANEOUSLY DAILY 27 mL 0  . metFORMIN (GLUCOPHAGE) 1000 MG tablet TAKE 1 TABLET BY MOUTH TWICE A DAY WITH FOOD 180 tablet 0  . metoprolol tartrate (LOPRESSOR) 25 MG tablet TAKE 1 TABLET BY MOUTH THREE TIMES A DAY 270 tablet 0  . Multiple Vitamin (MULTIVITAMIN) tablet Take 1 tablet by mouth daily.      Marland Kitchen amLODipine (NORVASC) 10 MG tablet TAKE ONE TABLET BY MOUTH DAILY 90 tablet 0  . atorvastatin (LIPITOR) 20 MG tablet TAKE 1 TABLET BY MOUTH ONCE DAILY 90 tablet 0  . furosemide (LASIX) 20 MG tablet TAKE 1 TABLET BY MOUTH ONCE DAILY 90 tablet 0  . calcium carbonate (OS-CAL) 600 MG TABS Take  600 mg by mouth daily.       No facility-administered medications prior to visit.     No Known Allergies  Review of Systems  Constitutional: Negative for chills, fever and malaise/fatigue.  HENT: Negative for congestion and hearing loss.   Eyes: Negative for blurred vision and discharge.  Respiratory: Negative for cough, sputum production and shortness of breath.   Cardiovascular: Negative for chest pain, palpitations and leg swelling.  Gastrointestinal: Negative for abdominal pain, blood in stool, constipation, diarrhea, heartburn, nausea and vomiting.  Genitourinary: Negative for dysuria, frequency, hematuria and urgency.  Musculoskeletal: Negative for back pain, falls and myalgias.  Skin: Negative for rash.  Neurological: Negative for dizziness, sensory change, loss of consciousness, weakness and headaches.  Endo/Heme/Allergies: Negative for environmental allergies. Does not bruise/bleed easily.  Psychiatric/Behavioral: Negative for depression and suicidal ideas. The patient is not nervous/anxious and does not have insomnia.        Objective:    Physical Exam  Constitutional: She is oriented to person, place, and time. She appears well-developed and well-nourished.  HENT:  Head: Normocephalic and atraumatic.  Eyes: Conjunctivae and EOM are normal.  Neck: Normal range of motion. Neck supple. No JVD present. Carotid bruit is not present. No thyromegaly present.  Cardiovascular: Normal rate, regular rhythm and normal heart sounds.  No murmur heard. Pulmonary/Chest: Effort normal and breath sounds normal. No respiratory distress. She has no wheezes. She has no rales. She exhibits no tenderness.  Musculoskeletal: She exhibits no edema.  Neurological: She is alert and oriented to person, place, and time.  Psychiatric: She has a normal mood and affect.  Nursing note and vitals reviewed.  Diabetic Foot Exam - Simple   Simple Foot Form Diabetic Foot exam was performed with the  following findings:  Yes 04/12/2018 11:20 AM  Visual Inspection No deformities, no ulcerations, no other skin breakdown bilaterally:  Yes Sensation Testing Intact to touch and monofilament testing bilaterally:  Yes Pulse Check Posterior Tibialis and Dorsalis pulse intact bilaterally:  Yes Comments      BP (!) 168/66   Pulse 80   Temp 98.1 F (36.7 C) (Oral)   Resp 16   Ht 5\' 3"  (1.6 m)   Wt 231 lb 3.2 oz (104.9 kg)   SpO2 100%   BMI 40.96 kg/m  Wt  Readings from Last 3 Encounters:  04/12/18 231 lb 3.2 oz (104.9 kg)  06/15/17 238 lb (108 kg)  06/03/17 238 lb 3.2 oz (108 kg)   BP Readings from Last 3 Encounters:  04/12/18 (!) 168/66  06/15/17 120/80  06/03/17 (!) 160/76     Immunization History  Administered Date(s) Administered  . Pneumococcal Polysaccharide-23 10/28/2011  . Tdap 10/28/2011  . Zoster 06/12/2015    Health Maintenance  Topic Date Due  . MAMMOGRAM  10/04/2010  . OPHTHALMOLOGY EXAM  06/24/2012  . PAP SMEAR  10/27/2014  . FOOT EXAM  11/21/2017  . HEMOGLOBIN A1C  12/10/2017  . DEXA SCAN  12/26/2017  . PNA vac Low Risk Adult (1 of 2 - PCV13) 12/26/2017  . HIV Screening  04/12/2024 (Originally 12/27/1965)  . INFLUENZA VACCINE  12/07/2026 (Originally 04/08/2018)  . COLONOSCOPY  10/05/2019  . TETANUS/TDAP  10/27/2021  . Hepatitis C Screening  Completed    Lab Results  Component Value Date   WBC 6.1 01/15/2016   HGB 14.5 01/15/2016   HCT 43.9 01/15/2016   PLT 234.0 01/15/2016   GLUCOSE 164 (H) 06/11/2017   CHOL 178 06/11/2017   TRIG 151.0 (H) 06/11/2017   HDL 51.10 06/11/2017   LDLDIRECT 114.0 07/25/2013   LDLCALC 97 06/11/2017   ALT 34 06/11/2017   AST 24 06/11/2017   NA 137 06/11/2017   K 4.7 06/11/2017   CL 99 06/11/2017   CREATININE 0.87 06/11/2017   BUN 17 06/11/2017   CO2 30 06/11/2017   TSH 2.19 10/04/2009   HGBA1C 8.7 (H) 06/11/2017   MICROALBUR <0.7 06/11/2017    Lab Results  Component Value Date   TSH 2.19 10/04/2009   Lab  Results  Component Value Date   WBC 6.1 01/15/2016   HGB 14.5 01/15/2016   HCT 43.9 01/15/2016   MCV 85.1 01/15/2016   PLT 234.0 01/15/2016   Lab Results  Component Value Date   NA 137 06/11/2017   K 4.7 06/11/2017   CO2 30 06/11/2017   GLUCOSE 164 (H) 06/11/2017   BUN 17 06/11/2017   CREATININE 0.87 06/11/2017   BILITOT 0.6 06/11/2017   ALKPHOS 71 06/11/2017   AST 24 06/11/2017   ALT 34 06/11/2017   PROT 7.8 06/11/2017   ALBUMIN 4.5 06/11/2017   CALCIUM 10.4 06/11/2017   GFR 69.57 06/11/2017   Lab Results  Component Value Date   CHOL 178 06/11/2017   Lab Results  Component Value Date   HDL 51.10 06/11/2017   Lab Results  Component Value Date   LDLCALC 97 06/11/2017   Lab Results  Component Value Date   TRIG 151.0 (H) 06/11/2017   Lab Results  Component Value Date   CHOLHDL 3 06/11/2017   Lab Results  Component Value Date   HGBA1C 8.7 (H) 06/11/2017         Assessment & Plan:   Problem List Items Addressed This Visit      Unprioritized   DM (diabetes mellitus) type II uncontrolled, periph vascular disorder (HCC)    hgba1c to be checked, minimize simple carbs. Increase exercise as tolerated. Continue current meds       Relevant Medications   amLODipine (NORVASC) 10 MG tablet   atorvastatin (LIPITOR) 20 MG tablet   furosemide (LASIX) 20 MG tablet   Other Relevant Orders   Hemoglobin A1c   Comprehensive metabolic panel   Lipid panel   Essential hypertension - Primary    Well controlled, no changes to meds. Encouraged  heart healthy diet such as the DASH diet and exercise as tolerated.       Relevant Medications   amLODipine (NORVASC) 10 MG tablet   atorvastatin (LIPITOR) 20 MG tablet   furosemide (LASIX) 20 MG tablet   Other Relevant Orders   Comprehensive metabolic panel    Other Visit Diagnoses    Hyperlipidemia LDL goal <70       Relevant Medications   amLODipine (NORVASC) 10 MG tablet   atorvastatin (LIPITOR) 20 MG tablet    furosemide (LASIX) 20 MG tablet   Other Relevant Orders   Comprehensive metabolic panel   Lipid panel   Hyperlipidemia, unspecified hyperlipidemia type       Relevant Medications   amLODipine (NORVASC) 10 MG tablet   atorvastatin (LIPITOR) 20 MG tablet   furosemide (LASIX) 20 MG tablet      I have discontinued Gavin Pound Tait's calcium carbonate. I have also changed her amLODipine, atorvastatin, and furosemide. Additionally, I am having her maintain her multivitamin, Insulin Pen Needle, enalapril, metoprolol tartrate, metFORMIN, LEVEMIR FLEXTOUCH, and liraglutide.  Meds ordered this encounter  Medications  . amLODipine (NORVASC) 10 MG tablet    Sig: Take 1 tablet (10 mg total) by mouth daily.    Dispense:  90 tablet    Refill:  1  . atorvastatin (LIPITOR) 20 MG tablet    Sig: Take 1 tablet (20 mg total) by mouth daily.    Dispense:  90 tablet    Refill:  1  . furosemide (LASIX) 20 MG tablet    Sig: Take 1 tablet (20 mg total) by mouth daily.    Dispense:  90 tablet    Refill:  1    .CMA served as Neurosurgeon during this visit. History, Physical and Plan performed by medical provider. Documentation and orders reviewed and attested to.   Donato Schultz, DO

## 2018-04-12 NOTE — Assessment & Plan Note (Signed)
Encouraged heart healthy diet, increase exercise, avoid trans fats, consider a krill oil cap daily 

## 2018-04-12 NOTE — Patient Instructions (Signed)
DASH Eating Plan DASH stands for "Dietary Approaches to Stop Hypertension." The DASH eating plan is a healthy eating plan that has been shown to reduce high blood pressure (hypertension). It may also reduce your risk for type 2 diabetes, heart disease, and stroke. The DASH eating plan may also help with weight loss. What are tips for following this plan? General guidelines  Avoid eating more than 2,300 mg (milligrams) of salt (sodium) a day. If you have hypertension, you may need to reduce your sodium intake to 1,500 mg a day.  Limit alcohol intake to no more than 1 drink a day for nonpregnant women and 2 drinks a day for men. One drink equals 12 oz of beer, 5 oz of wine, or 1 oz of hard liquor.  Work with your health care provider to maintain a healthy body weight or to lose weight. Ask what an ideal weight is for you.  Get at least 30 minutes of exercise that causes your heart to beat faster (aerobic exercise) most days of the week. Activities may include walking, swimming, or biking.  Work with your health care provider or diet and nutrition specialist (dietitian) to adjust your eating plan to your individual calorie needs. Reading food labels  Check food labels for the amount of sodium per serving. Choose foods with less than 5 percent of the Daily Value of sodium. Generally, foods with less than 300 mg of sodium per serving fit into this eating plan.  To find whole grains, look for the word "whole" as the first word in the ingredient list. Shopping  Buy products labeled as "low-sodium" or "no salt added."  Buy fresh foods. Avoid canned foods and premade or frozen meals. Cooking  Avoid adding salt when cooking. Use salt-free seasonings or herbs instead of table salt or sea salt. Check with your health care provider or pharmacist before using salt substitutes.  Do not fry foods. Cook foods using healthy methods such as baking, boiling, grilling, and broiling instead.  Cook with  heart-healthy oils, such as olive, canola, soybean, or sunflower oil. Meal planning   Eat a balanced diet that includes: ? 5 or more servings of fruits and vegetables each day. At each meal, try to fill half of your plate with fruits and vegetables. ? Up to 6-8 servings of whole grains each day. ? Less than 6 oz of lean meat, poultry, or fish each day. A 3-oz serving of meat is about the same size as a deck of cards. One egg equals 1 oz. ? 2 servings of low-fat dairy each day. ? A serving of nuts, seeds, or beans 5 times each week. ? Heart-healthy fats. Healthy fats called Omega-3 fatty acids are found in foods such as flaxseeds and coldwater fish, like sardines, salmon, and mackerel.  Limit how much you eat of the following: ? Canned or prepackaged foods. ? Food that is high in trans fat, such as fried foods. ? Food that is high in saturated fat, such as fatty meat. ? Sweets, desserts, sugary drinks, and other foods with added sugar. ? Full-fat dairy products.  Do not salt foods before eating.  Try to eat at least 2 vegetarian meals each week.  Eat more home-cooked food and less restaurant, buffet, and fast food.  When eating at a restaurant, ask that your food be prepared with less salt or no salt, if possible. What foods are recommended? The items listed may not be a complete list. Talk with your dietitian about what   dietary choices are best for you. Grains Whole-grain or whole-wheat bread. Whole-grain or whole-wheat pasta. Brown rice. Oatmeal. Quinoa. Bulgur. Whole-grain and low-sodium cereals. Pita bread. Low-fat, low-sodium crackers. Whole-wheat flour tortillas. Vegetables Fresh or frozen vegetables (raw, steamed, roasted, or grilled). Low-sodium or reduced-sodium tomato and vegetable juice. Low-sodium or reduced-sodium tomato sauce and tomato paste. Low-sodium or reduced-sodium canned vegetables. Fruits All fresh, dried, or frozen fruit. Canned fruit in natural juice (without  added sugar). Meat and other protein foods Skinless chicken or turkey. Ground chicken or turkey. Pork with fat trimmed off. Fish and seafood. Egg whites. Dried beans, peas, or lentils. Unsalted nuts, nut butters, and seeds. Unsalted canned beans. Lean cuts of beef with fat trimmed off. Low-sodium, lean deli meat. Dairy Low-fat (1%) or fat-free (skim) milk. Fat-free, low-fat, or reduced-fat cheeses. Nonfat, low-sodium ricotta or cottage cheese. Low-fat or nonfat yogurt. Low-fat, low-sodium cheese. Fats and oils Soft margarine without trans fats. Vegetable oil. Low-fat, reduced-fat, or light mayonnaise and salad dressings (reduced-sodium). Canola, safflower, olive, soybean, and sunflower oils. Avocado. Seasoning and other foods Herbs. Spices. Seasoning mixes without salt. Unsalted popcorn and pretzels. Fat-free sweets. What foods are not recommended? The items listed may not be a complete list. Talk with your dietitian about what dietary choices are best for you. Grains Baked goods made with fat, such as croissants, muffins, or some breads. Dry pasta or rice meal packs. Vegetables Creamed or fried vegetables. Vegetables in a cheese sauce. Regular canned vegetables (not low-sodium or reduced-sodium). Regular canned tomato sauce and paste (not low-sodium or reduced-sodium). Regular tomato and vegetable juice (not low-sodium or reduced-sodium). Pickles. Olives. Fruits Canned fruit in a light or heavy syrup. Fried fruit. Fruit in cream or butter sauce. Meat and other protein foods Fatty cuts of meat. Ribs. Fried meat. Bacon. Sausage. Bologna and other processed lunch meats. Salami. Fatback. Hotdogs. Bratwurst. Salted nuts and seeds. Canned beans with added salt. Canned or smoked fish. Whole eggs or egg yolks. Chicken or turkey with skin. Dairy Whole or 2% milk, cream, and half-and-half. Whole or full-fat cream cheese. Whole-fat or sweetened yogurt. Full-fat cheese. Nondairy creamers. Whipped toppings.  Processed cheese and cheese spreads. Fats and oils Butter. Stick margarine. Lard. Shortening. Ghee. Bacon fat. Tropical oils, such as coconut, palm kernel, or palm oil. Seasoning and other foods Salted popcorn and pretzels. Onion salt, garlic salt, seasoned salt, table salt, and sea salt. Worcestershire sauce. Tartar sauce. Barbecue sauce. Teriyaki sauce. Soy sauce, including reduced-sodium. Steak sauce. Canned and packaged gravies. Fish sauce. Oyster sauce. Cocktail sauce. Horseradish that you find on the shelf. Ketchup. Mustard. Meat flavorings and tenderizers. Bouillon cubes. Hot sauce and Tabasco sauce. Premade or packaged marinades. Premade or packaged taco seasonings. Relishes. Regular salad dressings. Where to find more information:  National Heart, Lung, and Blood Institute: www.nhlbi.nih.gov  American Heart Association: www.heart.org Summary  The DASH eating plan is a healthy eating plan that has been shown to reduce high blood pressure (hypertension). It may also reduce your risk for type 2 diabetes, heart disease, and stroke.  With the DASH eating plan, you should limit salt (sodium) intake to 2,300 mg a day. If you have hypertension, you may need to reduce your sodium intake to 1,500 mg a day.  When on the DASH eating plan, aim to eat more fresh fruits and vegetables, whole grains, lean proteins, low-fat dairy, and heart-healthy fats.  Work with your health care provider or diet and nutrition specialist (dietitian) to adjust your eating plan to your individual   calorie needs. This information is not intended to replace advice given to you by your health care provider. Make sure you discuss any questions you have with your health care provider. Document Released: 08/14/2011 Document Revised: 08/18/2016 Document Reviewed: 08/18/2016 Elsevier Interactive Patient Education  2018 Elsevier Inc.  

## 2018-04-12 NOTE — Assessment & Plan Note (Addendum)
hgba1c to be checked, minimize simple carbs. Increase exercise as tolerated. Continue current meds Pt on medicare now-- victoza and levemir will be very expensive She will bring in formulary for us to look for different meds

## 2018-04-12 NOTE — Assessment & Plan Note (Addendum)
Well controlled, no changes to meds. Encouraged heart healthy diet such as the DASH diet and exercise as tolerated. --bp runs better at home

## 2018-04-15 ENCOUNTER — Telehealth: Payer: Self-pay | Admitting: Family Medicine

## 2018-04-15 ENCOUNTER — Other Ambulatory Visit: Payer: Self-pay | Admitting: Family Medicine

## 2018-04-15 DIAGNOSIS — E785 Hyperlipidemia, unspecified: Secondary | ICD-10-CM

## 2018-04-15 DIAGNOSIS — I10 Essential (primary) hypertension: Secondary | ICD-10-CM

## 2018-04-15 NOTE — Telephone Encounter (Signed)
Copied from CRM [phone]. Topic: Quick Communication - See Telephone Encounter >> Apr 15, 2018  3:21 PM Valentina LucksMatos, Jackelin wrote: CRM for notification. See Telephone encounter for: 04/15/18.   Pt came in office dropping off her insurance book from [employer] of 2019 for provider to see info and to return to pt when done with the book. Pt would like to be called when book is not needed by the provider. Book given to Reynolds AmericanSheketia.

## 2018-04-19 ENCOUNTER — Other Ambulatory Visit: Payer: Self-pay | Admitting: *Deleted

## 2018-04-19 DIAGNOSIS — IMO0002 Reserved for concepts with insufficient information to code with codable children: Secondary | ICD-10-CM

## 2018-04-19 DIAGNOSIS — E1151 Type 2 diabetes mellitus with diabetic peripheral angiopathy without gangrene: Secondary | ICD-10-CM

## 2018-04-19 DIAGNOSIS — E1165 Type 2 diabetes mellitus with hyperglycemia: Secondary | ICD-10-CM

## 2018-04-19 DIAGNOSIS — E785 Hyperlipidemia, unspecified: Secondary | ICD-10-CM

## 2018-04-19 DIAGNOSIS — I10 Essential (primary) hypertension: Secondary | ICD-10-CM

## 2018-04-19 MED ORDER — ATORVASTATIN CALCIUM 40 MG PO TABS: 40.0000 mg | ORAL_TABLET | Freq: Every day | ORAL | 3 refills | Status: DC

## 2018-04-19 NOTE — Telephone Encounter (Signed)
It looks like patient would like something that her insurance will cover.  Dr. Laury AxonLowne wrote for ozempic.  Ins formulary  book is on eBaySheketias desk.

## 2018-04-22 NOTE — Telephone Encounter (Signed)
It looks like trulicity is her option in the GLP1 category

## 2018-04-22 NOTE — Telephone Encounter (Signed)
I have printed off novo pt assistance forms She can use it for ozempic and levemir We can give sample of ozempic pen

## 2018-04-26 NOTE — Telephone Encounter (Signed)
Left message on machine to call back  

## 2018-04-27 NOTE — Telephone Encounter (Signed)
Patient returned call. Jane Young stated that the staff is in a meeting. Call back # [phone]

## 2018-04-29 MED ORDER — SEMAGLUTIDE(0.25 OR 0.5MG/DOS) 2 MG/1.5ML ~~LOC~~ SOPN: 0.5000 mg | PEN_INJECTOR | SUBCUTANEOUS | 2 refills | Status: DC

## 2018-04-29 NOTE — Telephone Encounter (Signed)
Patient will try ozempic.  ozempic sent in and sample pen and book ready for pickup. Patient also asked about cholesterol medication and why she had to increase.  Advised per Dr. Laury AxonLowne it was a mistake and to keep taking the 20mg .

## 2018-05-03 ENCOUNTER — Other Ambulatory Visit: Payer: Self-pay | Admitting: Family Medicine

## 2018-05-03 DIAGNOSIS — E1165 Type 2 diabetes mellitus with hyperglycemia: Principal | ICD-10-CM

## 2018-05-03 DIAGNOSIS — IMO0002 Reserved for concepts with insufficient information to code with codable children: Secondary | ICD-10-CM

## 2018-05-03 DIAGNOSIS — E1151 Type 2 diabetes mellitus with diabetic peripheral angiopathy without gangrene: Secondary | ICD-10-CM

## 2018-05-07 ENCOUNTER — Other Ambulatory Visit: Payer: Self-pay | Admitting: Family Medicine

## 2018-05-07 DIAGNOSIS — E1165 Type 2 diabetes mellitus with hyperglycemia: Principal | ICD-10-CM

## 2018-05-07 DIAGNOSIS — IMO0002 Reserved for concepts with insufficient information to code with codable children: Secondary | ICD-10-CM

## 2018-05-07 DIAGNOSIS — E1151 Type 2 diabetes mellitus with diabetic peripheral angiopathy without gangrene: Secondary | ICD-10-CM

## 2018-05-12 ENCOUNTER — Other Ambulatory Visit: Payer: Self-pay | Admitting: Family Medicine

## 2018-05-12 ENCOUNTER — Telehealth: Payer: Self-pay

## 2018-05-12 DIAGNOSIS — I10 Essential (primary) hypertension: Secondary | ICD-10-CM

## 2018-05-12 DIAGNOSIS — E1151 Type 2 diabetes mellitus with diabetic peripheral angiopathy without gangrene: Secondary | ICD-10-CM

## 2018-05-12 DIAGNOSIS — IMO0002 Reserved for concepts with insufficient information to code with codable children: Secondary | ICD-10-CM

## 2018-05-12 DIAGNOSIS — E1165 Type 2 diabetes mellitus with hyperglycemia: Secondary | ICD-10-CM

## 2018-05-12 NOTE — Telephone Encounter (Signed)
Per Covermymeds- PA not needed.

## 2018-05-12 NOTE — Telephone Encounter (Signed)
PA initiated via Covermymeds; KEY: [id number]. Awaiting determination.

## 2018-05-14 NOTE — Telephone Encounter (Signed)
Lana Fish is calling from [facility] and states the patient is going out of town tomorrow 05/15/18. Would like this refilled today if possible. Requested on 05/12/18.

## 2018-06-18 ENCOUNTER — Other Ambulatory Visit: Payer: Self-pay | Admitting: Family Medicine

## 2018-06-18 DIAGNOSIS — E1151 Type 2 diabetes mellitus with diabetic peripheral angiopathy without gangrene: Secondary | ICD-10-CM

## 2018-06-18 DIAGNOSIS — E1165 Type 2 diabetes mellitus with hyperglycemia: Principal | ICD-10-CM

## 2018-06-18 DIAGNOSIS — IMO0002 Reserved for concepts with insufficient information to code with codable children: Secondary | ICD-10-CM

## 2018-07-16 ENCOUNTER — Other Ambulatory Visit: Payer: Self-pay | Admitting: Family Medicine

## 2018-07-16 DIAGNOSIS — E1165 Type 2 diabetes mellitus with hyperglycemia: Principal | ICD-10-CM

## 2018-07-16 DIAGNOSIS — E1151 Type 2 diabetes mellitus with diabetic peripheral angiopathy without gangrene: Secondary | ICD-10-CM

## 2018-07-16 DIAGNOSIS — IMO0002 Reserved for concepts with insufficient information to code with codable children: Secondary | ICD-10-CM

## 2018-08-16 ENCOUNTER — Other Ambulatory Visit: Payer: Self-pay | Admitting: Family Medicine

## 2018-08-16 DIAGNOSIS — I10 Essential (primary) hypertension: Secondary | ICD-10-CM

## 2018-08-16 DIAGNOSIS — E1165 Type 2 diabetes mellitus with hyperglycemia: Principal | ICD-10-CM

## 2018-08-16 DIAGNOSIS — IMO0002 Reserved for concepts with insufficient information to code with codable children: Secondary | ICD-10-CM

## 2018-08-16 DIAGNOSIS — E1151 Type 2 diabetes mellitus with diabetic peripheral angiopathy without gangrene: Secondary | ICD-10-CM

## 2018-08-17 ENCOUNTER — Other Ambulatory Visit: Payer: Self-pay | Admitting: *Deleted

## 2018-08-19 ENCOUNTER — Ambulatory Visit: Payer: Self-pay

## 2018-08-19 DIAGNOSIS — IMO0002 Reserved for concepts with insufficient information to code with codable children: Secondary | ICD-10-CM

## 2018-08-19 DIAGNOSIS — E1151 Type 2 diabetes mellitus with diabetic peripheral angiopathy without gangrene: Secondary | ICD-10-CM

## 2018-08-19 DIAGNOSIS — E1165 Type 2 diabetes mellitus with hyperglycemia: Principal | ICD-10-CM

## 2018-08-19 NOTE — Telephone Encounter (Signed)
Pt calling to ask why her metformin has been decreased. She was taking 1000 MG twice daily and now the rx states 1000 MG 0.5 tablet in the morning and 0.5 at night. PT is unsure how to take medicine. She was not notified of the change  Reason for Disposition . [1] Caller requesting NON-URGENT health information AND [2] PCP's office is the best resource  Answer Assessment - Initial Assessment Questions 1. REASON FOR CALL or QUESTION: "What is your reason for calling today?" or "How can I best help you?" or "What question do you have that I can help answer?"     Pt calling to ask why her metformin has been decreased. She was taking 1000 MG twice daily and now the rx states 1000 MG 0.5 tablet in the morning and 0.5 at night. PT is unsure how to take medicine. She was not notified of the change  Protocols used: INFORMATION ONLY CALL-A-AH

## 2018-08-20 NOTE — Telephone Encounter (Signed)
Please advise 

## 2018-08-20 NOTE — Telephone Encounter (Signed)
It should not have been changed --- please tell her to take 1000 bid She she check with ins on other meds?

## 2018-08-23 MED ORDER — METFORMIN HCL 1000 MG PO TABS: 500.0000 mg | ORAL_TABLET | Freq: Two times a day (BID) | ORAL | 0 refills | Status: DC

## 2018-08-23 NOTE — Addendum Note (Signed)
Addended by: Thelma BargeICHARDSON, Jemina Scahill D on: 08/23/2018 12:57 PM   Modules accepted: Orders

## 2018-08-23 NOTE — Telephone Encounter (Signed)
Patient notified that medication has been corrected and apologize about the change.

## 2018-09-14 ENCOUNTER — Other Ambulatory Visit: Payer: Self-pay | Admitting: Family Medicine

## 2018-09-14 DIAGNOSIS — E1151 Type 2 diabetes mellitus with diabetic peripheral angiopathy without gangrene: Secondary | ICD-10-CM

## 2018-09-14 DIAGNOSIS — E1165 Type 2 diabetes mellitus with hyperglycemia: Principal | ICD-10-CM

## 2018-09-14 DIAGNOSIS — IMO0002 Reserved for concepts with insufficient information to code with codable children: Secondary | ICD-10-CM

## 2018-09-14 MED ORDER — INSULIN DETEMIR 100 UNIT/ML FLEXPEN: PEN_INJECTOR | SUBCUTANEOUS | 0 refills | Status: DC

## 2018-09-14 NOTE — Telephone Encounter (Signed)
Requested medication (s) are due for refill today: yes  Requested medication (s) are on the active medication list: yes  Last refill:   07/19/18 for 30 ml  Future visit scheduled: no  Notes to clinic:  none  Requested Prescriptions  Pending Prescriptions Disp Refills   Insulin Detemir (LEVEMIR FLEXTOUCH) 100 UNIT/ML Pen 30 mL 0     Endocrinology:  Diabetes - Insulins Passed - 09/14/2018  3:22 PM      Passed - HBA1C is between 0 and 7.9 and within 180 days    Hgb A1c MFr Bld  Date Value Ref Range Status  04/12/2018 7.7 (H) 4.6 - 6.5 % Final    Comment:    Glycemic Control Guidelines for People with Diabetes:Non Diabetic:  <6%Goal of Therapy: <7%Additional Action Suggested:  >8%          Passed - Valid encounter within last 6 months    Recent Outpatient Visits          5 months ago Essential hypertension   Holiday representative at [facility], Sierra Brooks R, DO   1 year ago DM (diabetes mellitus) type II uncontrolled, periph vascular disorder (HCC)   Holiday representative at [facility] [place], [place] R, DO   1 year ago Acute pharyngitis, unspecified etiology   [facility] at [facility], Madelaine Bhat, PA-C   1 year ago Essential hypertension   Holiday representative at [facility] [place], [place] R, DO   2 years ago DM (diabetes mellitus) type II uncontrolled, periph vascular disorder [facility])   Holiday representative at [employer], [place], [place]

## 2018-09-14 NOTE — Telephone Encounter (Signed)
Copied from CRM [id number]. Topic: Quick Communication - Rx Refill/Question >> Sep 14, 2018  3:17 PM Gerrianne Scale wrote: Medication: LEVEMIR FLEXTOUCH 100 UNIT/ML Pen  Has the patient contacted their pharmacy? Yes.   (Agent: If no, request that the patient contact the pharmacy for the refill.) (Agent: If yes, when and what did the pharmacy advise?) Sunday and today  to call provider   Preferred Pharmacy (with phone number or street name):     Karin Golden at [facility] [address], [place] - [address] [facility] [phone] (Phone) [phone] (Fax)    Agent: Please be advised that RX refills may take up to 3 business days. We ask that you follow-up with your pharmacy.

## 2018-09-15 ENCOUNTER — Other Ambulatory Visit: Payer: Self-pay | Admitting: Family Medicine

## 2018-09-15 DIAGNOSIS — E1151 Type 2 diabetes mellitus with diabetic peripheral angiopathy without gangrene: Secondary | ICD-10-CM

## 2018-09-15 DIAGNOSIS — E1165 Type 2 diabetes mellitus with hyperglycemia: Principal | ICD-10-CM

## 2018-09-15 DIAGNOSIS — IMO0002 Reserved for concepts with insufficient information to code with codable children: Secondary | ICD-10-CM

## 2018-10-07 ENCOUNTER — Other Ambulatory Visit: Payer: Self-pay | Admitting: Family Medicine

## 2018-10-07 DIAGNOSIS — E1165 Type 2 diabetes mellitus with hyperglycemia: Principal | ICD-10-CM

## 2018-10-07 DIAGNOSIS — IMO0002 Reserved for concepts with insufficient information to code with codable children: Secondary | ICD-10-CM

## 2018-10-07 DIAGNOSIS — E1151 Type 2 diabetes mellitus with diabetic peripheral angiopathy without gangrene: Secondary | ICD-10-CM

## 2018-10-08 ENCOUNTER — Encounter: Payer: Self-pay | Admitting: Family Medicine

## 2018-10-08 ENCOUNTER — Ambulatory Visit (INDEPENDENT_AMBULATORY_CARE_PROVIDER_SITE_OTHER): Payer: Medicare HMO | Admitting: Family Medicine

## 2018-10-08 DIAGNOSIS — E1151 Type 2 diabetes mellitus with diabetic peripheral angiopathy without gangrene: Secondary | ICD-10-CM | POA: Diagnosis not present

## 2018-10-08 DIAGNOSIS — IMO0002 Reserved for concepts with insufficient information to code with codable children: Secondary | ICD-10-CM

## 2018-10-08 DIAGNOSIS — E785 Hyperlipidemia, unspecified: Secondary | ICD-10-CM

## 2018-10-08 DIAGNOSIS — I10 Essential (primary) hypertension: Secondary | ICD-10-CM

## 2018-10-08 DIAGNOSIS — E1165 Type 2 diabetes mellitus with hyperglycemia: Secondary | ICD-10-CM | POA: Diagnosis not present

## 2018-10-08 MED ORDER — METFORMIN HCL 1000 MG PO TABS: 1000.0000 mg | ORAL_TABLET | Freq: Two times a day (BID) | ORAL | 1 refills | Status: DC

## 2018-10-08 MED ORDER — FUROSEMIDE 20 MG PO TABS: 20.0000 mg | ORAL_TABLET | Freq: Every day | ORAL | 1 refills | Status: DC

## 2018-10-08 MED ORDER — INSULIN DETEMIR 100 UNIT/ML FLEXPEN: PEN_INJECTOR | SUBCUTANEOUS | 0 refills | Status: DC

## 2018-10-08 MED ORDER — AMLODIPINE BESYLATE 10 MG PO TABS: 10.0000 mg | ORAL_TABLET | Freq: Every day | ORAL | 1 refills | Status: DC

## 2018-10-08 MED ORDER — ATORVASTATIN CALCIUM 40 MG PO TABS: 40.0000 mg | ORAL_TABLET | Freq: Every day | ORAL | 3 refills | Status: DC

## 2018-10-08 MED ORDER — SEMAGLUTIDE(0.25 OR 0.5MG/DOS) 2 MG/1.5ML ~~LOC~~ SOPN: 0.5000 mg | PEN_INJECTOR | SUBCUTANEOUS | 2 refills | Status: DC

## 2018-10-08 NOTE — Patient Instructions (Signed)
Carbohydrate Counting for Diabetes Mellitus, Adult  Carbohydrate counting is a method of keeping track of how many carbohydrates you eat. Eating carbohydrates naturally increases the amount of sugar (glucose) in the blood. Counting how many carbohydrates you eat helps keep your blood glucose within normal limits, which helps you manage your diabetes (diabetes mellitus). It is important to know how many carbohydrates you can safely have in each meal. This is different for every person. A diet and nutrition specialist (registered dietitian) can help you make a meal plan and calculate how many carbohydrates you should have at each meal and snack. Carbohydrates are found in the following foods:  Grains, such as breads and cereals.  Dried beans and soy products.  Starchy vegetables, such as potatoes, peas, and corn.  Fruit and fruit juices.  Milk and yogurt.  Sweets and snack foods, such as cake, cookies, candy, chips, and soft drinks. How do I count carbohydrates? There are two ways to count carbohydrates in food. You can use either of the methods or a combination of both. Reading "Nutrition Facts" on packaged food The "Nutrition Facts" list is included on the labels of almost all packaged foods and beverages in the [place] It includes:  The serving size.  Information about nutrients in each serving, including the grams (g) of carbohydrate per serving. To use the "Nutrition Facts":  Decide how many servings you will have.  Multiply the number of servings by the number of carbohydrates per serving.  The resulting number is the total amount of carbohydrates that you will be having. Learning standard serving sizes of other foods When you eat carbohydrate foods that are not packaged or do not include "Nutrition Facts" on the label, you need to measure the servings in order to count the amount of carbohydrates:  Measure the foods that you will eat with a food scale or measuring cup, if needed.   Decide how many standard-size servings you will eat.  Multiply the number of servings by 15. Most carbohydrate-rich foods have about 15 g of carbohydrates per serving. ? For example, if you eat 8 oz (170 g) of strawberries, you will have eaten 2 servings and 30 g of carbohydrates (2 servings x 15 g = 30 g).  For foods that have more than one food mixed, such as soups and casseroles, you must count the carbohydrates in each food that is included. The following list contains standard serving sizes of common carbohydrate-rich foods. Each of these servings has about 15 g of carbohydrates:   hamburger bun or  English muffin.   oz (15 mL) syrup.   oz (14 g) jelly.  1 slice of bread.  1 six-inch tortilla.  3 oz (85 g) cooked rice or pasta.  4 oz (113 g) cooked dried beans.  4 oz (113 g) starchy vegetable, such as peas, corn, or potatoes.  4 oz (113 g) hot cereal.  4 oz (113 g) mashed potatoes or  of a large baked potato.  4 oz (113 g) canned or frozen fruit.  4 oz (120 mL) fruit juice.  4-6 crackers.  6 chicken nuggets.  6 oz (170 g) unsweetened dry cereal.  6 oz (170 g) plain fat-free yogurt or yogurt sweetened with artificial sweeteners.  8 oz (240 mL) milk.  8 oz (170 g) fresh fruit or one small piece of fruit.  24 oz (680 g) popped popcorn. Example of carbohydrate counting Sample meal  3 oz (85 g) chicken breast.  6 oz (170 g)   brown rice.  4 oz (113 g) corn.  8 oz (240 mL) milk.  8 oz (170 g) strawberries with sugar-free whipped topping. Carbohydrate calculation 1. Identify the foods that contain carbohydrates: ? Rice. ? Corn. ? Milk. ? Strawberries. 2. Calculate how many servings you have of each food: ? 2 servings rice. ? 1 serving corn. ? 1 serving milk. ? 1 serving strawberries. 3. Multiply each number of servings by 15 g: ? 2 servings rice x 15 g = 30 g. ? 1 serving corn x 15 g = 15 g. ? 1 serving milk x 15 g = 15 g. ? 1 serving  strawberries x 15 g = 15 g. 4. Add together all of the amounts to find the total grams of carbohydrates eaten: ? 30 g + 15 g + 15 g + 15 g = 75 g of carbohydrates total. Summary  Carbohydrate counting is a method of keeping track of how many carbohydrates you eat.  Eating carbohydrates naturally increases the amount of sugar (glucose) in the blood.  Counting how many carbohydrates you eat helps keep your blood glucose within normal limits, which helps you manage your diabetes.  A diet and nutrition specialist (registered dietitian) can help you make a meal plan and calculate how many carbohydrates you should have at each meal and snack. This information is not intended to replace advice given to you by your health care provider. Make sure you discuss any questions you have with your health care provider. Document Released: 08/25/2005 Document Revised: 03/04/2017 Document Reviewed: 02/06/2016 Elsevier Interactive Patient Education  2019 Elsevier Inc.  

## 2018-10-08 NOTE — Progress Notes (Signed)
Patient ID: Jane Young, female    DOB: Nov 22, 1950  Age: 68 y.o. MRN: [id number]    Subjective:  Subjective  HPI Jane Young presents for f/u dm , bp and chol.  No complaints   Her husbands business had to declare bankcru  Review of Systems  Constitutional: Negative for appetite change, diaphoresis, fatigue and unexpected weight change.  Eyes: Negative for pain, redness and visual disturbance.  Respiratory: Negative for cough, chest tightness, shortness of breath and wheezing.   Cardiovascular: Negative for chest pain, palpitations and leg swelling.  Endocrine: Negative for cold intolerance, heat intolerance, polydipsia, polyphagia and polyuria.  Genitourinary: Negative for difficulty urinating, dysuria and frequency.  Neurological: Negative for dizziness, light-headedness, numbness and headaches.    History Past Medical History:  Diagnosis Date  . Diabetes mellitus    Type 2  . Heart murmur   . Hyperlipidemia   . Hypertension     She has a past surgical history that includes Ovarian cyst removal; Cesarean section (1995); and Appendectomy.   Her family history includes Cancer in her father and maternal grandmother; Diabetes in her paternal grandmother; Heart disease in her paternal grandmother; Hyperlipidemia in her mother; Hypertension in her mother.She reports that she has never smoked. She has never used smokeless tobacco. She reports current alcohol use. She reports that she does not use drugs.  Current Outpatient Medications on File Prior to Visit  Medication Sig Dispense Refill  . enalapril (VASOTEC) 10 MG tablet Take 1 tablet (10 mg total) by mouth 2 (two) times daily. Needs ov 180 tablet 0  . Insulin Pen Needle (B-D UF III MINI PEN NEEDLES) 31G X 5 MM MISC use as directed 300 each 1  . metoprolol tartrate (LOPRESSOR) 25 MG tablet Take 1 tablet (25 mg total) by mouth 3 (three) times daily. Needs ov 270 tablet 0  . Multiple Vitamin (MULTIVITAMIN) tablet Take 1 tablet by  mouth daily.       No current facility-administered medications on file prior to visit.      Objective:  Objective  Physical Exam Vitals signs and nursing note reviewed.  Constitutional:      Appearance: She is well-developed.  HENT:     Head: Normocephalic and atraumatic.  Eyes:     Conjunctiva/sclera: Conjunctivae normal.  Neck:     Musculoskeletal: Normal range of motion and neck supple.     Thyroid: No thyromegaly.     Vascular: No carotid bruit or JVD.  Cardiovascular:     Rate and Rhythm: Normal rate and regular rhythm.     Heart sounds: Normal heart sounds. No murmur.  Pulmonary:     Effort: Pulmonary effort is normal. No respiratory distress.     Breath sounds: Normal breath sounds. No wheezing or rales.  Chest:     Chest wall: No tenderness.  Neurological:     Mental Status: She is alert and oriented to person, place, and time.    BP (!) 164/92   Pulse (!) 102   Resp 12   Ht 5\' 3"  (1.6 m)   Wt 235 lb (106.6 kg)   SpO2 97%   BMI 41.63 kg/m  Wt Readings from Last 3 Encounters:  10/08/18 235 lb (106.6 kg)  04/12/18 231 lb 3.2 oz (104.9 kg)  06/15/17 238 lb (108 kg)     Lab Results  Component Value Date   WBC 6.1 01/15/2016   HGB 14.5 01/15/2016   HCT 43.9 01/15/2016   PLT 234.0 01/15/2016  GLUCOSE 149 (H) 10/08/2018   CHOL 202 (H) 10/08/2018   TRIG 164 (H) 10/08/2018   HDL 55 10/08/2018   LDLDIRECT 114.0 07/25/2013   LDLCALC 119 (H) 10/08/2018   ALT 39 (H) 10/08/2018   AST 31 10/08/2018   NA 138 10/08/2018   K 4.6 10/08/2018   CL 99 10/08/2018   CREATININE 0.98 10/08/2018   BUN 17 10/08/2018   CO2 26 10/08/2018   TSH 2.19 10/04/2009   HGBA1C 7.7 (H) 04/12/2018   MICROALBUR <0.7 06/11/2017    No results found.   Assessment & Plan:  Plan  I have discontinued Jane Young's liraglutide. I have changed her LEVEMIR FLEXTOUCH to Insulin Detemir. I have also changed her Semaglutide(0.25 or 0.5MG /DOS) and metFORMIN. Additionally, I am having  her maintain her multivitamin, Insulin Pen Needle, metoprolol tartrate, enalapril, furosemide, atorvastatin, and amLODipine.  Meds ordered this encounter  Medications  . Insulin Detemir (LEVEMIR FLEXTOUCH) 100 UNIT/ML Pen    Sig: 65 u sq qd    Dispense:  30 mL    Refill:  0    Requested drug refills are authorized, however, the patient needs further evaluation and/or laboratory testing before further refills are given. Ask her to make an appointment for this.  . Semaglutide,0.25 or 0.5MG /DOS, (OZEMPIC, 0.25 OR 0.5 MG/DOSE,) 2 MG/1.5ML SOPN    Sig: Inject 0.5 mg into the skin once a week.    Dispense:  1.5 mL    Refill:  2  . metFORMIN (GLUCOPHAGE) 1000 MG tablet    Sig: Take 1 tablet (1,000 mg total) by mouth 2 (two) times daily with a meal.    Dispense:  180 tablet    Refill:  1  . furosemide (LASIX) 20 MG tablet    Sig: Take 1 tablet (20 mg total) by mouth daily.    Dispense:  90 tablet    Refill:  1  . atorvastatin (LIPITOR) 40 MG tablet    Sig: Take 1 tablet (40 mg total) by mouth daily.    Dispense:  90 tablet    Refill:  3  . amLODipine (NORVASC) 10 MG tablet    Sig: Take 1 tablet (10 mg total) by mouth daily.    Dispense:  90 tablet    Refill:  1    Problem List Items Addressed This Visit      Unprioritized   DM (diabetes mellitus) type II uncontrolled, periph vascular disorder (HCC)    hgba1c to be checked, minimize simple carbs. Increase exercise as tolerated. Continue current meds Pt has been noncompliant due to ins issues -- she has medicare now       Relevant Medications   Insulin Detemir (LEVEMIR FLEXTOUCH) 100 UNIT/ML Pen   Semaglutide,0.25 or 0.5MG /DOS, (OZEMPIC, 0.25 OR 0.5 MG/DOSE,) 2 MG/1.5ML SOPN   metFORMIN (GLUCOPHAGE) 1000 MG tablet   furosemide (LASIX) 20 MG tablet   atorvastatin (LIPITOR) 40 MG tablet   amLODipine (NORVASC) 10 MG tablet   Other Relevant Orders   Comprehensive metabolic panel (Completed)   Hemoglobin A1c   Lipid panel  (Completed)   Essential hypertension    Well controlled, no changes to meds. Encouraged heart healthy diet such as the DASH diet and exercise as tolerated.       Relevant Medications   furosemide (LASIX) 20 MG tablet   atorvastatin (LIPITOR) 40 MG tablet   amLODipine (NORVASC) 10 MG tablet   Other Relevant Orders   Comprehensive metabolic panel (Completed)   Hemoglobin A1c   Lipid  panel (Completed)   Hyperlipidemia LDL goal <100    Encouraged heart healthy diet, increase exercise, avoid trans fats, consider a krill oil cap daily      Relevant Medications   furosemide (LASIX) 20 MG tablet   atorvastatin (LIPITOR) 40 MG tablet   amLODipine (NORVASC) 10 MG tablet    Other Visit Diagnoses    Hyperlipidemia LDL goal <70       Relevant Medications   furosemide (LASIX) 20 MG tablet   atorvastatin (LIPITOR) 40 MG tablet   amLODipine (NORVASC) 10 MG tablet   Other Relevant Orders   Comprehensive metabolic panel (Completed)   Hemoglobin A1c   Lipid panel (Completed)      Follow-up: Return in about 6 months (around 04/08/2019), or if symptoms worsen or fail to improve, for hypertension, hyperlipidemia, diabetes II.  Donato Schultz, DO

## 2018-10-09 LAB — HEMOGLOBIN A1C
HEMOGLOBIN A1C: 8.8 %{Hb} — AB (ref <5.7)
MEAN PLASMA GLUCOSE: 206 (calc)
eAG (mmol/L): 11.4 (calc)

## 2018-10-09 LAB — COMPREHENSIVE METABOLIC PANEL
AG Ratio: 1.8 (calc) (ref 1.0–2.5)
ALT: 39 U/L — ABNORMAL HIGH (ref 6–29)
AST: 31 U/L (ref 10–35)
Albumin: 4.7 g/dL (ref 3.6–5.1)
Alkaline phosphatase (APISO): 72 U/L (ref 33–130)
BUN: 17 mg/dL (ref 7–25)
CHLORIDE: 99 mmol/L (ref 98–110)
CO2: 26 mmol/L (ref 20–32)
CREATININE: 0.98 mg/dL (ref 0.50–0.99)
Calcium: 10.8 mg/dL — ABNORMAL HIGH (ref 8.6–10.4)
GLOBULIN: 2.6 g/dL (ref 1.9–3.7)
Glucose, Bld: 149 mg/dL — ABNORMAL HIGH (ref 65–99)
Potassium: 4.6 mmol/L (ref 3.5–5.3)
Sodium: 138 mmol/L (ref 135–146)
Total Bilirubin: 0.6 mg/dL (ref 0.2–1.2)
Total Protein: 7.3 g/dL (ref 6.1–8.1)

## 2018-10-09 LAB — LIPID PANEL
Cholesterol: 202 mg/dL — ABNORMAL HIGH (ref <200)
HDL: 55 mg/dL (ref >50)
LDL Cholesterol (Calc): 119 mg/dL (calc) — ABNORMAL HIGH
NON-HDL CHOLESTEROL (CALC): 147 mg/dL — AB (ref <130)
Total CHOL/HDL Ratio: 3.7 (calc) (ref <5.0)
Triglycerides: 164 mg/dL — ABNORMAL HIGH (ref <150)

## 2018-10-09 NOTE — Assessment & Plan Note (Signed)
Encouraged heart healthy diet, increase exercise, avoid trans fats, consider a krill oil cap daily 

## 2018-10-09 NOTE — Assessment & Plan Note (Signed)
hgba1c to be checked, minimize simple carbs. Increase exercise as tolerated. Continue current meds Pt has been noncompliant due to ins issues -- she has medicare now

## 2018-10-09 NOTE — Assessment & Plan Note (Signed)
Well controlled, no changes to meds. Encouraged heart healthy diet such as the DASH diet and exercise as tolerated.  °

## 2018-10-13 ENCOUNTER — Other Ambulatory Visit: Payer: Self-pay | Admitting: Family Medicine

## 2018-10-13 DIAGNOSIS — E785 Hyperlipidemia, unspecified: Secondary | ICD-10-CM

## 2018-10-14 ENCOUNTER — Other Ambulatory Visit: Payer: Self-pay | Admitting: *Deleted

## 2018-10-14 DIAGNOSIS — E785 Hyperlipidemia, unspecified: Secondary | ICD-10-CM

## 2018-10-14 DIAGNOSIS — E1165 Type 2 diabetes mellitus with hyperglycemia: Principal | ICD-10-CM

## 2018-10-14 DIAGNOSIS — I10 Essential (primary) hypertension: Secondary | ICD-10-CM

## 2018-10-14 DIAGNOSIS — E1151 Type 2 diabetes mellitus with diabetic peripheral angiopathy without gangrene: Secondary | ICD-10-CM

## 2018-10-14 DIAGNOSIS — IMO0002 Reserved for concepts with insufficient information to code with codable children: Secondary | ICD-10-CM

## 2018-11-06 ENCOUNTER — Other Ambulatory Visit: Payer: Self-pay | Admitting: Family Medicine

## 2018-11-06 DIAGNOSIS — I10 Essential (primary) hypertension: Secondary | ICD-10-CM

## 2018-11-25 ENCOUNTER — Other Ambulatory Visit: Payer: Self-pay

## 2018-11-25 ENCOUNTER — Telehealth: Payer: Self-pay | Admitting: Family Medicine

## 2018-11-25 DIAGNOSIS — IMO0002 Reserved for concepts with insufficient information to code with codable children: Secondary | ICD-10-CM

## 2018-11-25 DIAGNOSIS — E1151 Type 2 diabetes mellitus with diabetic peripheral angiopathy without gangrene: Secondary | ICD-10-CM

## 2018-11-25 DIAGNOSIS — E1165 Type 2 diabetes mellitus with hyperglycemia: Principal | ICD-10-CM

## 2018-11-25 MED ORDER — INSULIN DETEMIR 100 UNIT/ML FLEXPEN: PEN_INJECTOR | SUBCUTANEOUS | 0 refills | Status: DC

## 2018-11-25 NOTE — Telephone Encounter (Signed)
Copied from CRM [id number]. Topic: Quick Communication - Rx Refill/Question >> Nov 25, 2018  1:08 PM Jens Som A wrote: Medication: Insulin Detemir (LEVEMIR FLEXTOUCH) 100 UNIT/ML Pen [[id number]] - 90 day  Has the patient contacted their pharmacy? Yes  (Agent: If no, request that the patient contact the pharmacy for the refill.) (Agent: If yes, when and what did the pharmacy advise?)  Preferred Pharmacy (with phone number or street name): Karin Golden at [facility] [address] [place] - [address] [facility] [phone] (Phone) [phone] (Fax)   Agent: Please be advised that RX refills may take up to 3 business days. We ask that you follow-up with your pharmacy.

## 2019-01-04 ENCOUNTER — Other Ambulatory Visit: Payer: Self-pay | Admitting: Family Medicine

## 2019-01-04 DIAGNOSIS — E785 Hyperlipidemia, unspecified: Secondary | ICD-10-CM

## 2019-01-08 ENCOUNTER — Other Ambulatory Visit: Payer: Self-pay | Admitting: Family Medicine

## 2019-01-08 DIAGNOSIS — IMO0002 Reserved for concepts with insufficient information to code with codable children: Secondary | ICD-10-CM

## 2019-01-08 DIAGNOSIS — E1151 Type 2 diabetes mellitus with diabetic peripheral angiopathy without gangrene: Secondary | ICD-10-CM

## 2019-01-08 DIAGNOSIS — E1165 Type 2 diabetes mellitus with hyperglycemia: Principal | ICD-10-CM

## 2019-02-02 ENCOUNTER — Telehealth: Payer: Self-pay | Admitting: Family Medicine

## 2019-02-02 NOTE — Telephone Encounter (Signed)
Pt dropped off [employer] for completion.  Paperwork placed in provider's box for pick up and completion by CMA.

## 2019-02-07 ENCOUNTER — Other Ambulatory Visit: Payer: Self-pay | Admitting: Family Medicine

## 2019-02-07 DIAGNOSIS — E1151 Type 2 diabetes mellitus with diabetic peripheral angiopathy without gangrene: Secondary | ICD-10-CM

## 2019-02-07 DIAGNOSIS — IMO0002 Reserved for concepts with insufficient information to code with codable children: Secondary | ICD-10-CM

## 2019-02-23 ENCOUNTER — Telehealth: Payer: Self-pay

## 2019-02-23 NOTE — Telephone Encounter (Signed)
[employer] Patient Garment/textile technologist faxed today and received confirmation. Medication orders were also sent previously. Placing in scan box.

## 2019-03-02 ENCOUNTER — Other Ambulatory Visit: Payer: Self-pay | Admitting: Family Medicine

## 2019-03-02 DIAGNOSIS — IMO0002 Reserved for concepts with insufficient information to code with codable children: Secondary | ICD-10-CM

## 2019-03-02 DIAGNOSIS — E1151 Type 2 diabetes mellitus with diabetic peripheral angiopathy without gangrene: Secondary | ICD-10-CM

## 2019-03-04 ENCOUNTER — Telehealth: Payer: Self-pay

## 2019-03-04 NOTE — Telephone Encounter (Signed)
Patient's prescriptions/insulin/needles have been placed in fridge ready for pick up. Called patient and left message to let her know they are ready.

## 2019-03-04 NOTE — Telephone Encounter (Signed)
error 

## 2019-03-07 NOTE — Telephone Encounter (Signed)
Patient requesting call back from Redbird Smith to discuss how process works. Advised patient per [facility], that as long as she was symptom free, she could pick up from office. Patient has additional questions regarding quantity and general questions regarding the assistance program. Please advise.

## 2019-03-07 NOTE — Telephone Encounter (Signed)
Called and spoke with patient she is going to pick up rx. Sheketia when you get back I would like to discuss her assistance program.

## 2019-04-04 ENCOUNTER — Other Ambulatory Visit: Payer: Self-pay | Admitting: Family Medicine

## 2019-04-04 DIAGNOSIS — I10 Essential (primary) hypertension: Secondary | ICD-10-CM

## 2019-04-04 DIAGNOSIS — E1151 Type 2 diabetes mellitus with diabetic peripheral angiopathy without gangrene: Secondary | ICD-10-CM

## 2019-04-04 DIAGNOSIS — IMO0002 Reserved for concepts with insufficient information to code with codable children: Secondary | ICD-10-CM

## 2019-07-03 ENCOUNTER — Other Ambulatory Visit: Payer: Self-pay | Admitting: Family Medicine

## 2019-07-03 DIAGNOSIS — I10 Essential (primary) hypertension: Secondary | ICD-10-CM

## 2019-07-03 DIAGNOSIS — IMO0002 Reserved for concepts with insufficient information to code with codable children: Secondary | ICD-10-CM

## 2019-07-03 DIAGNOSIS — E1151 Type 2 diabetes mellitus with diabetic peripheral angiopathy without gangrene: Secondary | ICD-10-CM

## 2019-07-08 DIAGNOSIS — R69 Illness, unspecified: Secondary | ICD-10-CM | POA: Diagnosis not present

## 2019-07-26 ENCOUNTER — Telehealth: Payer: Self-pay | Admitting: Family Medicine

## 2019-07-26 NOTE — Telephone Encounter (Signed)
Form placed in quick sign folder and given to Dr. Etter Sjogren

## 2019-07-26 NOTE — Telephone Encounter (Signed)
Patient came into the office to drop off a form to be completed for a novo nordisk assistance. Please fax completed form by 08/08/19 to [phone]. Placed in provider tray. Please call when completed.

## 2019-07-27 NOTE — Telephone Encounter (Signed)
Forms completed and faxed.  

## 2019-08-01 ENCOUNTER — Encounter: Payer: Self-pay | Admitting: Family Medicine

## 2019-08-01 ENCOUNTER — Telehealth: Payer: Self-pay | Admitting: Family Medicine

## 2019-08-01 ENCOUNTER — Other Ambulatory Visit: Payer: Self-pay

## 2019-08-01 ENCOUNTER — Ambulatory Visit (INDEPENDENT_AMBULATORY_CARE_PROVIDER_SITE_OTHER): Payer: Medicare HMO | Admitting: Family Medicine

## 2019-08-01 VITALS — BP 130/80 | HR 91 | Temp 97.3°F | Resp 18 | Ht 63.0 in | Wt 225.6 lb

## 2019-08-01 DIAGNOSIS — IMO0002 Reserved for concepts with insufficient information to code with codable children: Secondary | ICD-10-CM

## 2019-08-01 DIAGNOSIS — E1169 Type 2 diabetes mellitus with other specified complication: Secondary | ICD-10-CM

## 2019-08-01 DIAGNOSIS — E1165 Type 2 diabetes mellitus with hyperglycemia: Secondary | ICD-10-CM

## 2019-08-01 DIAGNOSIS — E1151 Type 2 diabetes mellitus with diabetic peripheral angiopathy without gangrene: Secondary | ICD-10-CM | POA: Diagnosis not present

## 2019-08-01 DIAGNOSIS — E785 Hyperlipidemia, unspecified: Secondary | ICD-10-CM

## 2019-08-01 DIAGNOSIS — Z23 Encounter for immunization: Secondary | ICD-10-CM

## 2019-08-01 DIAGNOSIS — I10 Essential (primary) hypertension: Secondary | ICD-10-CM

## 2019-08-01 LAB — COMPREHENSIVE METABOLIC PANEL
ALT: 25 U/L (ref 0–35)
AST: 21 U/L (ref 0–37)
Albumin: 4.2 g/dL (ref 3.5–5.2)
Alkaline Phosphatase: 63 U/L (ref 39–117)
BUN: 17 mg/dL (ref 6–23)
CO2: 27 mEq/L (ref 19–32)
Calcium: 10.4 mg/dL (ref 8.4–10.5)
Chloride: 100 mEq/L (ref 96–112)
Creatinine, Ser: 0.79 mg/dL (ref 0.40–1.20)
GFR: 72.68 mL/min (ref >60.00)
Glucose, Bld: 156 mg/dL — ABNORMAL HIGH (ref 70–99)
Potassium: 4.3 mEq/L (ref 3.5–5.1)
Sodium: 139 mEq/L (ref 135–145)
Total Bilirubin: 0.6 mg/dL (ref 0.2–1.2)
Total Protein: 7.1 g/dL (ref 6.0–8.3)

## 2019-08-01 LAB — LIPID PANEL
Cholesterol: 186 mg/dL (ref 0–200)
HDL: 54.6 mg/dL (ref >39.00)
LDL Cholesterol: 107 mg/dL — ABNORMAL HIGH (ref 0–99)
NonHDL: 131.14
Total CHOL/HDL Ratio: 3
Triglycerides: 122 mg/dL (ref 0.0–149.0)
VLDL: 24.4 mg/dL (ref 0.0–40.0)

## 2019-08-01 LAB — MICROALBUMIN / CREATININE URINE RATIO
Creatinine,U: 34.9 mg/dL
Microalb Creat Ratio: 2 mg/g (ref 0.0–30.0)
Microalb, Ur: <0.7 mg/dL (ref 0.0–1.9)

## 2019-08-01 LAB — HEMOGLOBIN A1C: Hgb A1c MFr Bld: 7.1 % — ABNORMAL HIGH (ref 4.6–6.5)

## 2019-08-01 MED ORDER — ENALAPRIL MALEATE 10 MG PO TABS: 10.0000 mg | ORAL_TABLET | Freq: Two times a day (BID) | ORAL | 1 refills | Status: DC

## 2019-08-01 MED ORDER — BLOOD GLUCOSE MONITOR KIT: PACK | 0 refills | Status: DC

## 2019-08-01 MED ORDER — AMLODIPINE BESYLATE 10 MG PO TABS: 10.0000 mg | ORAL_TABLET | Freq: Every day | ORAL | 0 refills | Status: DC

## 2019-08-01 MED ORDER — FUROSEMIDE 20 MG PO TABS: 20.0000 mg | ORAL_TABLET | Freq: Every day | ORAL | 1 refills | Status: DC

## 2019-08-01 MED ORDER — METFORMIN HCL 1000 MG PO TABS: 1000.0000 mg | ORAL_TABLET | Freq: Two times a day (BID) | ORAL | 1 refills | Status: DC

## 2019-08-01 MED ORDER — METOPROLOL TARTRATE 25 MG PO TABS: 25.0000 mg | ORAL_TABLET | Freq: Three times a day (TID) | ORAL | 1 refills | Status: DC

## 2019-08-01 NOTE — Telephone Encounter (Signed)
Jane Young at [place], [place]  [address] [place] [address]  Phone: [phone] Fax: [phone]  Not a 24 hour pharmacy; exact hours not known.

## 2019-08-01 NOTE — Progress Notes (Addendum)
Patient ID: Jane Young, female    DOB: March 08, 1951  Age: 68 y.o. MRN: [id number]    Subjective:  Subjective  HPI Jane Young presents for dm, chol   HYPERTENSION   Blood pressure range-not checking   Chest pain- no      Dyspnea- no Lightheadedness- no   Edema- no  Other side effects - no   Medication compliance: good Low salt diet- yes    DIABETES    Blood Sugar ranges-not checking   Polyuria- no New Visual problems- no  Hypoglycemic symptoms- no  Other side effects-no Medication compliance - good Last eye exam- due Foot exam- today   HYPERLIPIDEMIA  Medication compliance- good RUQ pain- no  Muscle aches- no Other side effects-no      Review of Systems  Constitutional: Negative for appetite change, diaphoresis, fatigue and unexpected weight change.  Eyes: Negative for pain, redness and visual disturbance.  Respiratory: Negative for cough, chest tightness, shortness of breath and wheezing.   Cardiovascular: Negative for chest pain, palpitations and leg swelling.  Endocrine: Negative for cold intolerance, heat intolerance, polydipsia, polyphagia and polyuria.  Genitourinary: Negative for difficulty urinating, dysuria and frequency.  Neurological: Negative for dizziness, light-headedness, numbness and headaches.    History Past Medical History:  Diagnosis Date  . Diabetes mellitus    Type 2  . Heart murmur   . Hyperlipidemia   . Hypertension     She has a past surgical history that includes Ovarian cyst removal; Cesarean section (1995); and Appendectomy.   Her family history includes Cancer in her father and maternal grandmother; Diabetes in her paternal grandmother; Heart disease in her paternal grandmother; Hyperlipidemia in her mother; Hypertension in her mother.She reports that she has never smoked. She has never used smokeless tobacco. She reports current alcohol use. She reports that she does not use drugs.  Current Outpatient Medications on File  Prior to Visit  Medication Sig Dispense Refill  . atorvastatin (LIPITOR) 40 MG tablet Take 1 tablet (40 mg total) by mouth daily. 90 tablet 3  . Insulin Pen Needle (B-D UF III MINI PEN NEEDLES) 31G X 5 MM MISC use as directed 300 each 1  . LEVEMIR FLEXTOUCH 100 UNIT/ML Pen INJECT 65 UNITS UNDER THE SKIN DAILY 15 mL 0  . Multiple Vitamin (MULTIVITAMIN) tablet Take 1 tablet by mouth daily.      Marland Kitchen OZEMPIC, 0.25 OR 0.5 MG/DOSE, 2 MG/1.5ML SOPN DIAL AND INJECT SUBCUTANEOUSLY 0.5 MG WEEKLY 1.5 mL 2   No current facility-administered medications on file prior to visit.      Objective:  Objective  Physical Exam Vitals signs and nursing note reviewed.  Constitutional:      Appearance: She is well-developed.  HENT:     Head: Normocephalic and atraumatic.  Eyes:     Conjunctiva/sclera: Conjunctivae normal.  Neck:     Musculoskeletal: Normal range of motion and neck supple.     Thyroid: No thyromegaly.     Vascular: No carotid bruit or JVD.  Cardiovascular:     Rate and Rhythm: Normal rate and regular rhythm.     Heart sounds: Normal heart sounds. No murmur.  Pulmonary:     Effort: Pulmonary effort is normal. No respiratory distress.     Breath sounds: Normal breath sounds. No wheezing or rales.  Chest:     Chest wall: No tenderness.  Neurological:     Mental Status: She is alert and oriented to person, place, and time.    Diabetic Foot  Exam - Simple   Simple Foot Form Diabetic Foot exam was performed with the following findings: Yes 08/01/2019 12:05 PM  Visual Inspection No deformities, no ulcerations, no other skin breakdown bilaterally: Yes Sensation Testing Intact to touch and monofilament testing bilaterally: Yes Pulse Check Posterior Tibialis and Dorsalis pulse intact bilaterally: Yes Comments     BP 130/80 (BP Location: Right Arm, Patient Position: Sitting, Cuff Size: Large)   Pulse 91   Temp (!) 97.3 F (36.3 C) (Temporal)   Resp 18   Ht _0  (1.6 m)   Wt 225 lb  9.6 oz (102.3 kg)   SpO2 99%   BMI 39.96 kg/m  Wt Readings from Last 3 Encounters:  08/01/19 225 lb 9.6 oz (102.3 kg)  10/08/18 235 lb (106.6 kg)  04/12/18 231 lb 3.2 oz (104.9 kg)     Lab Results  Component Value Date   WBC 6.1 01/15/2016   HGB 14.5 01/15/2016   HCT 43.9 01/15/2016   PLT 234.0 01/15/2016   GLUCOSE 149 (H) 10/08/2018   CHOL 202 (H) 10/08/2018   TRIG 164 (H) 10/08/2018   HDL 55 10/08/2018   LDLDIRECT 114.0 07/25/2013   LDLCALC 119 (H) 10/08/2018   ALT 39 (H) 10/08/2018   AST 31 10/08/2018   NA 138 10/08/2018   K 4.6 10/08/2018   CL 99 10/08/2018   CREATININE 0.98 10/08/2018   BUN 17 10/08/2018   CO2 26 10/08/2018   TSH 2.19 10/04/2009   HGBA1C 8.8 (H) 10/08/2018   MICROALBUR <0.7 06/11/2017    No results found.   Assessment & Plan:  Plan  I have changed Jane Young's amLODipine, enalapril, furosemide, metFORMIN, and metoprolol tartrate. I am also having her start on blood glucose meter kit and supplies. Additionally, I am having her maintain her multivitamin, Insulin Pen Needle, atorvastatin, Ozempic (0.25 or 0.5 MG/DOSE), and Levemir FlexTouch.  Meds ordered this encounter  Medications  . amLODipine (NORVASC) 10 MG tablet    Sig: Take 1 tablet (10 mg total) by mouth daily.    Dispense:  30 tablet    Refill:  0    APPOINTMENT NEEDED  . enalapril (VASOTEC) 10 MG tablet    Sig: Take 1 tablet (10 mg total) by mouth 2 (two) times daily.    Dispense:  180 tablet    Refill:  1    APPOINTMENT NEEDED  . furosemide (LASIX) 20 MG tablet    Sig: Take 1 tablet (20 mg total) by mouth daily.    Dispense:  90 tablet    Refill:  1    APPOINTMENT NEEDED  . metFORMIN (GLUCOPHAGE) 1000 MG tablet    Sig: Take 1 tablet (1,000 mg total) by mouth 2 (two) times daily with a meal.    Dispense:  180 tablet    Refill:  1    APPOINTMENT NEEDED  . metoprolol tartrate (LOPRESSOR) 25 MG tablet    Sig: Take 1 tablet (25 mg total) by mouth 3 (three) times daily.     Dispense:  270 tablet    Refill:  1    APPOINTMENT NEEDED  . blood glucose meter kit and supplies KIT    Sig: Dispense based on patient and insurance preference. Use up to four times daily as directed. (FOR ICD-9 250.00, 250.01).    Dispense:  1 each    Refill:  0    Order Specific Question:   Number of strips    Answer:   100  Order Specific Question:   Number of lancets    Answer:   100    Problem List Items Addressed This Visit      Unprioritized   DM (diabetes mellitus) type II uncontrolled, periph vascular disorder ([place])    hgba1c to be checked  minimize simple carbs. Increase exercise as tolerated. Continue current meds       Relevant Medications   amLODipine (NORVASC) 10 MG tablet   enalapril (VASOTEC) 10 MG tablet   furosemide (LASIX) 20 MG tablet   metFORMIN (GLUCOPHAGE) 1000 MG tablet   metoprolol tartrate (LOPRESSOR) 25 MG tablet   blood glucose meter kit and supplies KIT   Other Relevant Orders   Hemoglobin A1c   Comprehensive metabolic panel   Microalbumin / creatinine urine ratio   Essential hypertension    Well controlled, no changes to meds. Encouraged heart healthy diet such as the DASH diet and exercise as tolerated.       Relevant Medications   amLODipine (NORVASC) 10 MG tablet   enalapril (VASOTEC) 10 MG tablet   furosemide (LASIX) 20 MG tablet   metoprolol tartrate (LOPRESSOR) 25 MG tablet   Other Relevant Orders   Microalbumin / creatinine urine ratio   Hyperlipidemia LDL goal <100    Tolerating statin, encouraged heart healthy diet, avoid trans fats, minimize simple carbs and saturated fats. Increase exercise as tolerated      Relevant Medications   amLODipine (NORVASC) 10 MG tablet   enalapril (VASOTEC) 10 MG tablet   furosemide (LASIX) 20 MG tablet   metoprolol tartrate (LOPRESSOR) 25 MG tablet    Other Visit Diagnoses    Hyperlipidemia associated with type 2 diabetes mellitus ([place])    -  Primary   Relevant Medications   enalapril  (VASOTEC) 10 MG tablet   metFORMIN (GLUCOPHAGE) 1000 MG tablet   Other Relevant Orders   Lipid panel   Need for pneumococcal vaccination       Relevant Orders   Pneumococcal vaccine 23 (Completed)      Follow-up: Return in about 6 months (around 01/29/2020), or if symptoms worsen or fail to improve, for annual exam, fasting.  Ann Held, DO

## 2019-08-01 NOTE — Assessment & Plan Note (Signed)
Well controlled, no changes to meds. Encouraged heart healthy diet such as the DASH diet and exercise as tolerated.  °

## 2019-08-01 NOTE — Telephone Encounter (Signed)
Pharmacy called and is requesting to have a 90 day supply of pts amlodipine sent in to the pharmacy like the other medications. Please advise.

## 2019-08-01 NOTE — Assessment & Plan Note (Signed)
Tolerating statin, encouraged heart healthy diet, avoid trans fats, minimize simple carbs and saturated fats. Increase exercise as tolerated 

## 2019-08-01 NOTE — Patient Instructions (Signed)
Carbohydrate Counting for Diabetes Mellitus, Adult  Carbohydrate counting is a method of keeping track of how many carbohydrates you eat. Eating carbohydrates naturally increases the amount of sugar (glucose) in the blood. Counting how many carbohydrates you eat helps keep your blood glucose within normal limits, which helps you manage your diabetes (diabetes mellitus). It is important to know how many carbohydrates you can safely have in each meal. This is different for every person. A diet and nutrition specialist (registered dietitian) can help you make a meal plan and calculate how many carbohydrates you should have at each meal and snack. Carbohydrates are found in the following foods:  Grains, such as breads and cereals.  Dried beans and soy products.  Starchy vegetables, such as potatoes, peas, and corn.  Fruit and fruit juices.  Milk and yogurt.  Sweets and snack foods, such as cake, cookies, candy, chips, and soft drinks. How do I count carbohydrates? There are two ways to count carbohydrates in food. You can use either of the methods or a combination of both. Reading "Nutrition Facts" on packaged food The "Nutrition Facts" list is included on the labels of almost all packaged foods and beverages in the [place] It includes:  The serving size.  Information about nutrients in each serving, including the grams (g) of carbohydrate per serving. To use the "Nutrition Facts":  Decide how many servings you will have.  Multiply the number of servings by the number of carbohydrates per serving.  The resulting number is the total amount of carbohydrates that you will be having. Learning standard serving sizes of other foods When you eat carbohydrate foods that are not packaged or do not include "Nutrition Facts" on the label, you need to measure the servings in order to count the amount of carbohydrates:  Measure the foods that you will eat with a food scale or measuring cup, if needed.   Decide how many standard-size servings you will eat.  Multiply the number of servings by 15. Most carbohydrate-rich foods have about 15 g of carbohydrates per serving. ? For example, if you eat 8 oz (170 g) of strawberries, you will have eaten 2 servings and 30 g of carbohydrates (2 servings x 15 g = 30 g).  For foods that have more than one food mixed, such as soups and casseroles, you must count the carbohydrates in each food that is included. The following list contains standard serving sizes of common carbohydrate-rich foods. Each of these servings has about 15 g of carbohydrates:   hamburger bun or  English muffin.   oz (15 mL) syrup.   oz (14 g) jelly.  1 slice of bread.  1 six-inch tortilla.  3 oz (85 g) cooked rice or pasta.  4 oz (113 g) cooked dried beans.  4 oz (113 g) starchy vegetable, such as peas, corn, or potatoes.  4 oz (113 g) hot cereal.  4 oz (113 g) mashed potatoes or  of a large baked potato.  4 oz (113 g) canned or frozen fruit.  4 oz (120 mL) fruit juice.  4-6 crackers.  6 chicken nuggets.  6 oz (170 g) unsweetened dry cereal.  6 oz (170 g) plain fat-free yogurt or yogurt sweetened with artificial sweeteners.  8 oz (240 mL) milk.  8 oz (170 g) fresh fruit or one small piece of fruit.  24 oz (680 g) popped popcorn. Example of carbohydrate counting Sample meal  3 oz (85 g) chicken breast.  6 oz (170 g)   brown rice.  4 oz (113 g) corn.  8 oz (240 mL) milk.  8 oz (170 g) strawberries with sugar-free whipped topping. Carbohydrate calculation 1. Identify the foods that contain carbohydrates: ? Rice. ? Corn. ? Milk. ? Strawberries. 2. Calculate how many servings you have of each food: ? 2 servings rice. ? 1 serving corn. ? 1 serving milk. ? 1 serving strawberries. 3. Multiply each number of servings by 15 g: ? 2 servings rice x 15 g = 30 g. ? 1 serving corn x 15 g = 15 g. ? 1 serving milk x 15 g = 15 g. ? 1 serving  strawberries x 15 g = 15 g. 4. Add together all of the amounts to find the total grams of carbohydrates eaten: ? 30 g + 15 g + 15 g + 15 g = 75 g of carbohydrates total. Summary  Carbohydrate counting is a method of keeping track of how many carbohydrates you eat.  Eating carbohydrates naturally increases the amount of sugar (glucose) in the blood.  Counting how many carbohydrates you eat helps keep your blood glucose within normal limits, which helps you manage your diabetes.  A diet and nutrition specialist (registered dietitian) can help you make a meal plan and calculate how many carbohydrates you should have at each meal and snack. This information is not intended to replace advice given to you by your health care provider. Make sure you discuss any questions you have with your health care provider. Document Released: 08/25/2005 Document Revised: 03/19/2017 Document Reviewed: 02/06/2016 Elsevier Patient Education  2020 Elsevier Inc.  

## 2019-08-01 NOTE — Assessment & Plan Note (Signed)
hgba1c to be checked minimize simple carbs. Increase exercise as tolerated. Continue current meds 

## 2019-08-01 NOTE — Telephone Encounter (Signed)
Please see encounter below

## 2019-08-02 ENCOUNTER — Other Ambulatory Visit: Payer: Self-pay | Admitting: Family Medicine

## 2019-08-02 DIAGNOSIS — E1169 Type 2 diabetes mellitus with other specified complication: Secondary | ICD-10-CM

## 2019-08-02 DIAGNOSIS — E1165 Type 2 diabetes mellitus with hyperglycemia: Secondary | ICD-10-CM

## 2019-08-03 ENCOUNTER — Other Ambulatory Visit: Payer: Self-pay

## 2019-08-03 ENCOUNTER — Other Ambulatory Visit: Payer: Self-pay | Admitting: Family Medicine

## 2019-08-03 DIAGNOSIS — E1151 Type 2 diabetes mellitus with diabetic peripheral angiopathy without gangrene: Secondary | ICD-10-CM

## 2019-08-03 DIAGNOSIS — I10 Essential (primary) hypertension: Secondary | ICD-10-CM

## 2019-08-03 DIAGNOSIS — IMO0002 Reserved for concepts with insufficient information to code with codable children: Secondary | ICD-10-CM

## 2019-08-03 MED ORDER — OZEMPIC (1 MG/DOSE) 2 MG/1.5ML ~~LOC~~ SOPN: 1.0000 mg | PEN_INJECTOR | SUBCUTANEOUS | 2 refills | Status: DC

## 2019-08-08 ENCOUNTER — Telehealth: Payer: Self-pay

## 2019-08-08 NOTE — Telephone Encounter (Signed)
Patient notified that application refaxed with updated rx for 1mg 

## 2019-08-08 NOTE — Telephone Encounter (Signed)
Patient notified that application refaxed with updated rx for 1mg   

## 2019-08-08 NOTE — Telephone Encounter (Signed)
Copied from [place] [phone]. Topic: General - Inquiry >> Aug 05, 2019 12:15 PM Mathis Bud wrote: Reason for CRM: Patient has questions about her medications is requesting a call back from PCP or nurse.  Patient states she needs a call back by the end of the day on Monday. she needs to turn in a medication application due to patience assistant program.  Due Monday night. Call back [phone]

## 2019-08-08 NOTE — Telephone Encounter (Signed)
Pt stated Dr. Etter Sjogren left message My Chart regarding increasing her ozempic. Pt stated that if this is what Dr. Etter Sjogren wants (she has not heard anything else regarding this) then that information will need to be sent to [employer] as well. She is not sure of the proper channel to do that other than the form that has already been submitted to them. Please advise. Today is the cut off. Requesting CB

## 2019-08-11 NOTE — Telephone Encounter (Signed)
Patient notified.  Medication (ozempic and levamir in fridge and pen needles up front for pickup)

## 2019-08-11 NOTE — Telephone Encounter (Signed)
Spoke with [facility] about changing Ozempic to 1mg  and they are unable to do so.  We did receive refill from them for the 0.5mg  that she usually gets.  Johnny the rep does not have samples for the 1mg .  Also I have to resend in her application in January to renew.    I spoke with patient yesterday and advised her that I was unable to get any samples or get them to change her order.  She is willing to take 2 shots to equal the 1 mg if that is ok with you or she stays on what she is on til January.  If she does the two shots she will have enough to last until we get in her new order.  Please let me know how you would like to proceed?

## 2019-08-11 NOTE — Telephone Encounter (Signed)
She can do 2 shots

## 2019-09-13 NOTE — Telephone Encounter (Signed)
Patient called in regarding her medication application due to pt assistance program .   Please call back   [phone]

## 2019-09-13 NOTE — Telephone Encounter (Signed)
Pt notified that forms were faxed and received confirmation.

## 2019-09-30 ENCOUNTER — Ambulatory Visit: Payer: Medicare HMO | Attending: Internal Medicine

## 2019-09-30 DIAGNOSIS — Z23 Encounter for immunization: Secondary | ICD-10-CM | POA: Insufficient documentation

## 2019-09-30 NOTE — Progress Notes (Signed)
   Covid-19 Vaccination Clinic  Name:  Jane Young    MRN: [id number] DOB: June 02, 1951  09/30/2019  Ms. Jane Young was observed post Covid-19 immunization for 15 minutes without incidence. She was provided with Vaccine Information Sheet and instruction to access the V-Safe system.   Ms. Jane Young was instructed to call 911 with any severe reactions post vaccine: Marland Kitchen Difficulty breathing  . Swelling of your face and throat  . A fast heartbeat  . A bad rash all over your body  . Dizziness and weakness    Immunizations Administered    Name Date Dose VIS Date Route   Pfizer COVID-19 Vaccine 09/30/2019  6:13 PM 0.3 mL 08/19/2019 Intramuscular   Manufacturer: [employer], [employer]   Lot: [id number]   NDC: [address]-1

## 2019-10-02 ENCOUNTER — Other Ambulatory Visit: Payer: Self-pay | Admitting: Family Medicine

## 2019-10-02 DIAGNOSIS — E785 Hyperlipidemia, unspecified: Secondary | ICD-10-CM

## 2019-10-21 ENCOUNTER — Ambulatory Visit: Payer: Medicare HMO | Attending: Internal Medicine

## 2019-10-21 DIAGNOSIS — Z23 Encounter for immunization: Secondary | ICD-10-CM | POA: Insufficient documentation

## 2019-10-21 NOTE — Progress Notes (Signed)
   Covid-19 Vaccination Clinic  Name:  Jane Young    MRN: [id number] DOB: [birth year]  10/21/2019  [name] was observed post Covid-19 immunization for 15 minutes without incidence. She was provided with Vaccine Information Sheet and instruction to access the V-Safe system.   [name] was instructed to call 911 with any severe reactions post vaccine: Marland Kitchen Difficulty breathing  . Swelling of your face and throat  . A fast heartbeat  . A bad rash all over your body  . Dizziness and weakness    Immunizations Administered    Name Date Dose VIS Date Route   Pfizer COVID-19 Vaccine 10/21/2019  5:42 PM 0.3 mL 08/19/2019 Intramuscular   Manufacturer: [employer], [employer]   Lot: [id number]   NDC: [address]-1

## 2019-10-24 ENCOUNTER — Telehealth: Payer: Self-pay | Admitting: *Deleted

## 2019-10-24 NOTE — Telephone Encounter (Signed)
Left message with husband.  Need to get more information about what date needed to be changed.

## 2019-10-24 NOTE — Telephone Encounter (Signed)
[  2/12 11:09 AM] [name]     Ms Jane Young is requuesting to speak to you said you were helping her with an application  ?[2/12 11:10 AM] [name]     she was told by [employer] that he dates needs to be corrected  ?[2/12 11:11 AM] [name]     Can you call her back  [phone]

## 2019-10-25 NOTE — Telephone Encounter (Signed)
Spoke with patient and dates changed and papers refaxed.

## 2019-11-18 ENCOUNTER — Telehealth: Payer: Self-pay | Admitting: *Deleted

## 2019-11-18 NOTE — Telephone Encounter (Signed)
Ozempic order placed.  It will take about 10-14 days (3/26-4/1) till it will be shipped here.  It will be for a 4 month supply.  She has 2 refills left.  Patient notified.

## 2019-11-18 NOTE — Telephone Encounter (Signed)
Patient called and stated that she needed her Ozempic through the patient assistance.  We only received the Levemir.  I called [employer] and it was a 25 min wait.  Put in a request for them to call back.  Patient notified that I am waiting on their call back to check with them to see why she did not get the Ozempic.

## 2019-12-08 ENCOUNTER — Other Ambulatory Visit: Payer: Self-pay | Admitting: Family Medicine

## 2019-12-08 DIAGNOSIS — I10 Essential (primary) hypertension: Secondary | ICD-10-CM

## 2020-03-06 ENCOUNTER — Telehealth: Payer: Self-pay

## 2020-03-06 DIAGNOSIS — I10 Essential (primary) hypertension: Secondary | ICD-10-CM

## 2020-03-06 MED ORDER — AMLODIPINE BESYLATE 10 MG PO TABS: ORAL_TABLET | ORAL | 0 refills | Status: DC

## 2020-03-06 NOTE — Addendum Note (Signed)
Addended by: Roxanne Gates on: 03/06/2020 12:54 PM   Modules accepted: Orders

## 2020-03-06 NOTE — Telephone Encounter (Signed)
Patient called in to get a prescription refill for amLODipine (NORVASC) 10 MG tablet [[id number]   Please send it to Karin Golden at [facility] [address], [place] - [address] [facility]  [address] [place], [place] [address]  Phone:  [phone] Fax:  [phone]  DEA #:  --

## 2020-03-06 NOTE — Telephone Encounter (Signed)
Refill sent.

## 2020-03-20 ENCOUNTER — Telehealth: Payer: Self-pay

## 2020-03-20 NOTE — Telephone Encounter (Signed)
Pt called. VM left that insulin and needles have arrived. Medicine in the frig close to [place]. Bottom left drawer.

## 2020-03-30 ENCOUNTER — Other Ambulatory Visit: Payer: Self-pay | Admitting: Family Medicine

## 2020-03-30 DIAGNOSIS — I10 Essential (primary) hypertension: Secondary | ICD-10-CM

## 2020-04-03 DIAGNOSIS — R69 Illness, unspecified: Secondary | ICD-10-CM | POA: Diagnosis not present

## 2020-04-06 ENCOUNTER — Other Ambulatory Visit: Payer: Self-pay

## 2020-04-06 ENCOUNTER — Encounter: Payer: Self-pay | Admitting: Family Medicine

## 2020-04-06 ENCOUNTER — Ambulatory Visit (INDEPENDENT_AMBULATORY_CARE_PROVIDER_SITE_OTHER): Payer: Medicare HMO | Admitting: Family Medicine

## 2020-04-06 VITALS — BP 190/77 | HR 92 | Temp 99.0°F | Resp 16 | Ht 63.0 in | Wt 231.0 lb

## 2020-04-06 DIAGNOSIS — E1165 Type 2 diabetes mellitus with hyperglycemia: Secondary | ICD-10-CM

## 2020-04-06 DIAGNOSIS — I10 Essential (primary) hypertension: Secondary | ICD-10-CM | POA: Diagnosis not present

## 2020-04-06 DIAGNOSIS — E1169 Type 2 diabetes mellitus with other specified complication: Secondary | ICD-10-CM

## 2020-04-06 DIAGNOSIS — E785 Hyperlipidemia, unspecified: Secondary | ICD-10-CM | POA: Diagnosis not present

## 2020-04-06 DIAGNOSIS — IMO0002 Reserved for concepts with insufficient information to code with codable children: Secondary | ICD-10-CM

## 2020-04-06 DIAGNOSIS — E1151 Type 2 diabetes mellitus with diabetic peripheral angiopathy without gangrene: Secondary | ICD-10-CM

## 2020-04-06 LAB — LIPID PANEL
Cholesterol: 179 mg/dL (ref 0–200)
HDL: 49.2 mg/dL (ref >39.00)
LDL Cholesterol: 101 mg/dL — ABNORMAL HIGH (ref 0–99)
NonHDL: 129.83
Total CHOL/HDL Ratio: 4
Triglycerides: 144 mg/dL (ref 0.0–149.0)
VLDL: 28.8 mg/dL (ref 0.0–40.0)

## 2020-04-06 LAB — COMPREHENSIVE METABOLIC PANEL
ALT: 30 U/L (ref 0–35)
AST: 24 U/L (ref 0–37)
Albumin: 4.4 g/dL (ref 3.5–5.2)
Alkaline Phosphatase: 61 U/L (ref 39–117)
BUN: 19 mg/dL (ref 6–23)
CO2: 27 mEq/L (ref 19–32)
Calcium: 10 mg/dL (ref 8.4–10.5)
Chloride: 101 mEq/L (ref 96–112)
Creatinine, Ser: 0.99 mg/dL (ref 0.40–1.20)
GFR: 55.9 mL/min — ABNORMAL LOW (ref >60.00)
Glucose, Bld: 155 mg/dL — ABNORMAL HIGH (ref 70–99)
Potassium: 3.8 mEq/L (ref 3.5–5.1)
Sodium: 138 mEq/L (ref 135–145)
Total Bilirubin: 0.7 mg/dL (ref 0.2–1.2)
Total Protein: 7.2 g/dL (ref 6.0–8.3)

## 2020-04-06 LAB — HEMOGLOBIN A1C: Hgb A1c MFr Bld: 7.9 % — ABNORMAL HIGH (ref 4.6–6.5)

## 2020-04-06 MED ORDER — AMLODIPINE BESYLATE 10 MG PO TABS: ORAL_TABLET | ORAL | 1 refills | Status: DC

## 2020-04-06 MED ORDER — METOPROLOL TARTRATE 100 MG PO TABS: ORAL_TABLET | ORAL | 1 refills | Status: DC

## 2020-04-06 NOTE — Assessment & Plan Note (Signed)
Poorly controlled will alter medications, encouraged DASH diet, minimize caffeine and obtain adequate sleep. Report concerning symptoms and follow up as directed and as needed 

## 2020-04-06 NOTE — Progress Notes (Signed)
Patient ID: Jane Young, female    DOB: [birth year]  Age: 69 y.o. MRN: [id number]    Subjective:  Subjective  HPI Minda Faas presents for dm, chol and bp  HYPERTENSION   Blood pressure range-not checking   Chest pain- no      Dyspnea- no Lightheadedness- no   Edema- no  Other side effects - no   Medication compliance: good Low salt diet- yes    DIABETES    Blood Sugar ranges-no  Polyuria- no New Visual problems- no  Hypoglycemic symptoms- no  Other side effects-good Medication compliance - good Last eye exam- due    HYPERLIPIDEMIA  Medication compliance- yes  RUQ pain- no  Muscle aches- no Other side effects-no      Review of Systems  Constitutional: Negative for appetite change, diaphoresis, fatigue and unexpected weight change.  Eyes: Negative for pain, redness and visual disturbance.  Respiratory: Negative for cough, chest tightness, shortness of breath and wheezing.   Cardiovascular: Negative for chest pain, palpitations and leg swelling.  Endocrine: Negative for cold intolerance, heat intolerance, polydipsia, polyphagia and polyuria.  Genitourinary: Negative for difficulty urinating, dysuria and frequency.  Neurological: Negative for dizziness, light-headedness, numbness and headaches.    History Past Medical History:  Diagnosis Date  . Diabetes mellitus    Type 2  . Heart murmur   . Hyperlipidemia   . Hypertension     She has a past surgical history that includes Ovarian cyst removal; Cesarean section (1995); and Appendectomy.   Her family history includes Cancer in her father and maternal grandmother; Diabetes in her paternal grandmother; Heart disease in her paternal grandmother; Hyperlipidemia in her mother; Hypertension in her mother.She reports that she has never smoked. She has never used smokeless tobacco. She reports current alcohol use. She reports that she does not use drugs.  Current Outpatient Medications on File Prior to Visit    Medication Sig Dispense Refill  . aspirin EC 81 MG tablet Take 81 mg by mouth daily. Swallow whole.    Marland Kitchen atorvastatin (LIPITOR) 40 MG tablet TAKE ONE TABLET BY MOUTH DAILY 90 tablet 3  . blood glucose meter kit and supplies KIT Dispense based on patient and insurance preference. Use up to four times daily as directed. (FOR ICD-9 250.00, 250.01). 1 each 0  . enalapril (VASOTEC) 10 MG tablet TAKE ONE TABLET BY MOUTH TWICE A DAY 180 tablet 1  . furosemide (LASIX) 20 MG tablet TAKE ONE TABLET BY MOUTH DAILY **APPOINTMENT NEEDED** 30 tablet 2  . Insulin Pen Needle (B-D UF III MINI PEN NEEDLES) 31G X 5 MM MISC use as directed 300 each 1  . LEVEMIR FLEXTOUCH 100 UNIT/ML Pen INJECT 65 UNITS UNDER THE SKIN DAILY 15 mL 0  . metFORMIN (GLUCOPHAGE) 1000 MG tablet TAKE ONE TABLET BY MOUTH TWICE A DAY WITH MEALS **APPOINTMENT NEEDED FOR REFILLS** 60 tablet 2  . Multiple Vitamin (MULTIVITAMIN) tablet Take 1 tablet by mouth daily.      . Omega-3 Fatty Acids (FISH OIL) 1000 MG CAPS Take by mouth.    . Semaglutide, 1 MG/DOSE, (OZEMPIC, 1 MG/DOSE,) 2 MG/1.5ML SOPN Inject 1 mg into the skin once a week. 1.5 mL 2   No current facility-administered medications on file prior to visit.     Objective:  Objective  Physical Exam Vitals and nursing note reviewed.  Constitutional:      Appearance: She is well-developed.  HENT:     Head: Normocephalic and atraumatic.  Eyes:  Conjunctiva/sclera: Conjunctivae normal.  Neck:     Thyroid: No thyromegaly.     Vascular: No carotid bruit or JVD.  Cardiovascular:     Rate and Rhythm: Normal rate and regular rhythm.     Heart sounds: Normal heart sounds. No murmur heard.   Pulmonary:     Effort: Pulmonary effort is normal. No respiratory distress.     Breath sounds: Normal breath sounds. No wheezing or rales.  Chest:     Chest wall: No tenderness.  Musculoskeletal:     Cervical back: Normal range of motion and neck supple.  Neurological:     Mental Status:  She is alert and oriented to person, place, and time.    BP (!) 190/77 (BP Location: Left Arm, Patient Position: Sitting, Cuff Size: Large)   Pulse 92   Temp 99 F (37.2 C) (Oral)   Resp 16   Ht _0  (1.6 m)   Wt (!) 231 lb (104.8 kg)   SpO2 100%   BMI 40.92 kg/m  Wt Readings from Last 3 Encounters:  04/06/20 (!) 231 lb (104.8 kg)  08/01/19 225 lb 9.6 oz (102.3 kg)  10/08/18 235 lb (106.6 kg)     Lab Results  Component Value Date   WBC 6.1 01/15/2016   HGB 14.5 01/15/2016   HCT 43.9 01/15/2016   PLT 234.0 01/15/2016   GLUCOSE 156 (H) 08/01/2019   CHOL 186 08/01/2019   TRIG 122.0 08/01/2019   HDL 54.60 08/01/2019   LDLDIRECT 114.0 07/25/2013   LDLCALC 107 (H) 08/01/2019   ALT 25 08/01/2019   AST 21 08/01/2019   NA 139 08/01/2019   K 4.3 08/01/2019   CL 100 08/01/2019   CREATININE 0.79 08/01/2019   BUN 17 08/01/2019   CO2 27 08/01/2019   TSH 2.19 10/04/2009   HGBA1C 7.1 (H) 08/01/2019   MICROALBUR <0.7 08/01/2019    No results found.   Assessment & Plan:  Plan  I have discontinued Kailan Mcarthy's metoprolol tartrate. I have also changed her amLODipine. Additionally, I am having her start on metoprolol tartrate. Lastly, I am having her maintain her multivitamin, Insulin Pen Needle, Levemir FlexTouch, blood glucose meter kit and supplies, enalapril, metFORMIN, furosemide, Ozempic (1 MG/DOSE), atorvastatin, Fish Oil, and aspirin EC.  Meds ordered this encounter  Medications  . amLODipine (NORVASC) 10 MG tablet    Sig: 1 po qd    Dispense:  90 tablet    Refill:  1  . metoprolol tartrate (LOPRESSOR) 100 MG tablet    Sig: 1/2 po tid    Dispense:  180 tablet    Refill:  1    Problem List Items Addressed This Visit      Unprioritized   DM (diabetes mellitus) type II uncontrolled, periph vascular disorder ([place])    hgba1c to be checked , minimize simple carbs. Increase exercise as tolerated. Continue current meds       Relevant Medications   aspirin EC 81  MG tablet   amLODipine (NORVASC) 10 MG tablet   metoprolol tartrate (LOPRESSOR) 100 MG tablet   Essential hypertension    Poorly controlled will alter medications, encouraged DASH diet, minimize caffeine and obtain adequate sleep. Report concerning symptoms and follow up as directed and as needed      Relevant Medications   aspirin EC 81 MG tablet   amLODipine (NORVASC) 10 MG tablet   metoprolol tartrate (LOPRESSOR) 100 MG tablet   Other Relevant Orders   Lipid panel   Hemoglobin  A1c   Comprehensive metabolic panel   Hyperlipidemia LDL goal <100    Tolerating statin, encouraged heart healthy diet, avoid trans fats, minimize simple carbs and saturated fats. Increase exercise as tolerated      Relevant Medications   aspirin EC 81 MG tablet   amLODipine (NORVASC) 10 MG tablet   metoprolol tartrate (LOPRESSOR) 100 MG tablet    Other Visit Diagnoses    Hyperlipidemia associated with type 2 diabetes mellitus ([place])    -  Primary   Relevant Medications   aspirin EC 81 MG tablet   Other Relevant Orders   Lipid panel   Hemoglobin A1c   Comprehensive metabolic panel   Uncontrolled type 2 diabetes mellitus with hyperglycemia ([facility])       Relevant Medications   aspirin EC 81 MG tablet   Other Relevant Orders   Lipid panel   Hemoglobin A1c   Comprehensive metabolic panel      Follow-up: Return in about 3 months (around 07/07/2020), or if symptoms worsen or fail to improve, for hypertension.  Ann Held, DO

## 2020-04-06 NOTE — Patient Instructions (Signed)
Carbohydrate Counting for Diabetes Mellitus, Adult  Carbohydrate counting is a method of keeping track of how many carbohydrates you eat. Eating carbohydrates naturally increases the amount of sugar (glucose) in the blood. Counting how many carbohydrates you eat helps keep your blood glucose within normal limits, which helps you manage your diabetes (diabetes mellitus). It is important to know how many carbohydrates you can safely have in each meal. This is different for every person. A diet and nutrition specialist (registered dietitian) can help you make a meal plan and calculate how many carbohydrates you should have at each meal and snack. Carbohydrates are found in the following foods:  Grains, such as breads and cereals.  Dried beans and soy products.  Starchy vegetables, such as potatoes, peas, and corn.  Fruit and fruit juices.  Milk and yogurt.  Sweets and snack foods, such as cake, cookies, candy, chips, and soft drinks. How do I count carbohydrates? There are two ways to count carbohydrates in food. You can use either of the methods or a combination of both. Reading "Nutrition Facts" on packaged food The "Nutrition Facts" list is included on the labels of almost all packaged foods and beverages in the [place] It includes:  The serving size.  Information about nutrients in each serving, including the grams (g) of carbohydrate per serving. To use the "Nutrition Facts":  Decide how many servings you will have.  Multiply the number of servings by the number of carbohydrates per serving.  The resulting number is the total amount of carbohydrates that you will be having. Learning standard serving sizes of other foods When you eat carbohydrate foods that are not packaged or do not include "Nutrition Facts" on the label, you need to measure the servings in order to count the amount of carbohydrates:  Measure the foods that you will eat with a food scale or measuring cup, if  needed.  Decide how many standard-size servings you will eat.  Multiply the number of servings by 15. Most carbohydrate-rich foods have about 15 g of carbohydrates per serving. ? For example, if you eat 8 oz (170 g) of strawberries, you will have eaten 2 servings and 30 g of carbohydrates (2 servings x 15 g = 30 g).  For foods that have more than one food mixed, such as soups and casseroles, you must count the carbohydrates in each food that is included. The following list contains standard serving sizes of common carbohydrate-rich foods. Each of these servings has about 15 g of carbohydrates:   hamburger bun or  English muffin.   oz (15 mL) syrup.   oz (14 g) jelly.  1 slice of bread.  1 six-inch tortilla.  3 oz (85 g) cooked rice or pasta.  4 oz (113 g) cooked dried beans.  4 oz (113 g) starchy vegetable, such as peas, corn, or potatoes.  4 oz (113 g) hot cereal.  4 oz (113 g) mashed potatoes or  of a large baked potato.  4 oz (113 g) canned or frozen fruit.  4 oz (120 mL) fruit juice.  4-6 crackers.  6 chicken nuggets.  6 oz (170 g) unsweetened dry cereal.  6 oz (170 g) plain fat-free yogurt or yogurt sweetened with artificial sweeteners.  8 oz (240 mL) milk.  8 oz (170 g) fresh fruit or one small piece of fruit.  24 oz (680 g) popped popcorn. Example of carbohydrate counting Sample meal  3 oz (85 g) chicken breast.  6 oz (170 g)   brown rice.  4 oz (113 g) corn.  8 oz (240 mL) milk.  8 oz (170 g) strawberries with sugar-free whipped topping. Carbohydrate calculation 1. Identify the foods that contain carbohydrates: ? Rice. ? Corn. ? Milk. ? Strawberries. 2. Calculate how many servings you have of each food: ? 2 servings rice. ? 1 serving corn. ? 1 serving milk. ? 1 serving strawberries. 3. Multiply each number of servings by 15 g: ? 2 servings rice x 15 g = 30 g. ? 1 serving corn x 15 g = 15 g. ? 1 serving milk x 15 g = 15 g. ? 1  serving strawberries x 15 g = 15 g. 4. Add together all of the amounts to find the total grams of carbohydrates eaten: ? 30 g + 15 g + 15 g + 15 g = 75 g of carbohydrates total. Summary  Carbohydrate counting is a method of keeping track of how many carbohydrates you eat.  Eating carbohydrates naturally increases the amount of sugar (glucose) in the blood.  Counting how many carbohydrates you eat helps keep your blood glucose within normal limits, which helps you manage your diabetes.  A diet and nutrition specialist (registered dietitian) can help you make a meal plan and calculate how many carbohydrates you should have at each meal and snack. This information is not intended to replace advice given to you by your health care provider. Make sure you discuss any questions you have with your health care provider. Document Revised: 03/19/2017 Document Reviewed: 02/06/2016 Elsevier Patient Education  2020 Elsevier Inc.  

## 2020-04-06 NOTE — Assessment & Plan Note (Signed)
hgba1c to be checked, minimize simple carbs. Increase exercise as tolerated. Continue current meds  

## 2020-04-06 NOTE — Assessment & Plan Note (Signed)
Tolerating statin, encouraged heart healthy diet, avoid trans fats, minimize simple carbs and saturated fats. Increase exercise as tolerated 

## 2020-04-08 ENCOUNTER — Other Ambulatory Visit: Payer: Self-pay | Admitting: Family Medicine

## 2020-04-08 DIAGNOSIS — E1165 Type 2 diabetes mellitus with hyperglycemia: Secondary | ICD-10-CM

## 2020-04-09 ENCOUNTER — Other Ambulatory Visit: Payer: Self-pay

## 2020-04-09 MED ORDER — ROSUVASTATIN CALCIUM 40 MG PO TABS: 40.0000 mg | ORAL_TABLET | Freq: Every day | ORAL | 1 refills | Status: DC

## 2020-04-13 ENCOUNTER — Encounter: Payer: Self-pay | Admitting: Family Medicine

## 2020-04-25 ENCOUNTER — Other Ambulatory Visit: Payer: Self-pay | Admitting: Family Medicine

## 2020-04-25 NOTE — Telephone Encounter (Signed)
Patient was signed up for patient assistance program ordered on 04/06/2020 and faxed. They explained it will take 7-10 days to be shipped here however we still do not have her medication in.   Called Dr. Laury Axon and did get verbal permission to give patient 1 sample pen until her medication gets in. I have filled out sample log in book.   Ozempic 0.5mg /1 mg injection pen. Lot# [id number] EXP-01/2022 weekly injection SQ.

## 2020-04-25 NOTE — Telephone Encounter (Signed)
Medication: Semaglutide, 1 MG/DOSE, (OZEMPIC, 1 MG/DOSE,) 2 MG/1.5ML SOPN [[id number]]   Patient states that is on patient assist with this medication and patient is wondering if we have received patient in office yet.    Patient is completely out.

## 2020-04-29 ENCOUNTER — Other Ambulatory Visit: Payer: Self-pay | Admitting: Family Medicine

## 2020-04-29 DIAGNOSIS — I10 Essential (primary) hypertension: Secondary | ICD-10-CM

## 2020-04-29 DIAGNOSIS — IMO0002 Reserved for concepts with insufficient information to code with codable children: Secondary | ICD-10-CM

## 2020-04-29 DIAGNOSIS — E1151 Type 2 diabetes mellitus with diabetic peripheral angiopathy without gangrene: Secondary | ICD-10-CM

## 2020-04-30 ENCOUNTER — Telehealth: Payer: Self-pay | Admitting: *Deleted

## 2020-04-30 NOTE — Telephone Encounter (Signed)
Left message on machine that form has been resent.  [facility] stated that they did not received the back page of the reorder form.  Will follow up with [facility] on Wednesday

## 2020-05-02 ENCOUNTER — Telehealth: Payer: Self-pay | Admitting: *Deleted

## 2020-05-02 NOTE — Telephone Encounter (Signed)
error 

## 2020-05-22 ENCOUNTER — Telehealth: Payer: Self-pay | Admitting: *Deleted

## 2020-05-22 NOTE — Telephone Encounter (Signed)
Spoke with patient to let her know that Ozempic is ready for pickup.  Medication is in fridge in nurse station.  

## 2020-06-21 DIAGNOSIS — R69 Illness, unspecified: Secondary | ICD-10-CM | POA: Diagnosis not present

## 2020-06-28 ENCOUNTER — Telehealth: Payer: Self-pay | Admitting: Family Medicine

## 2020-06-28 ENCOUNTER — Other Ambulatory Visit: Payer: Self-pay | Admitting: *Deleted

## 2020-06-28 ENCOUNTER — Other Ambulatory Visit: Payer: Self-pay | Admitting: Family Medicine

## 2020-06-28 DIAGNOSIS — E785 Hyperlipidemia, unspecified: Secondary | ICD-10-CM

## 2020-06-28 NOTE — Telephone Encounter (Signed)
Patient called in reference to Oz Medication sent to her by [facility] , patient states received the wrong mediation dosage, patient received .05 not her correct dosage of 1MG . Patient is requesting a correction submitted to patient assistance   Please advise

## 2020-06-28 NOTE — Telephone Encounter (Signed)
Patient notified that correct order has been faxed to [employer].

## 2020-07-04 ENCOUNTER — Telehealth: Payer: Self-pay | Admitting: *Deleted

## 2020-07-04 NOTE — Telephone Encounter (Signed)
Order faxed for Ozempic 1mg  on 06/28/20.  Called [facility] to check status on Tuesday 07/03/20 and no call back.  Called again on Wednesday 07/04/20 and they call back and stated that order was processed on 07/02/20 and will be sent in 10-14 days from that date.  Left message on patient machine that medication should arrive here 10-14 from 07-02-20  (around 11/8-11/11).  Will all once it arrives.

## 2020-07-16 NOTE — Telephone Encounter (Signed)
Called patient. Left VM on home answering machine. Meds in the frig near [place] in the bottom drawer with a sticky note with patient's name.

## 2020-07-19 ENCOUNTER — Telehealth: Payer: Self-pay | Admitting: Family Medicine

## 2020-07-19 NOTE — Telephone Encounter (Signed)
Pt dropped off document to be filled out by provider (7 pages [employer] Program Application) Pt would like document to be faxed to [phone] when document ready. Document put at front office tray under providers name.

## 2020-07-20 NOTE — Telephone Encounter (Signed)
Received form-filled out portion I can. Placing in providers folder for signature.

## 2020-07-25 NOTE — Telephone Encounter (Signed)
Form faxed and sent to scan

## 2020-07-26 ENCOUNTER — Other Ambulatory Visit: Payer: Self-pay | Admitting: Family Medicine

## 2020-07-26 DIAGNOSIS — I10 Essential (primary) hypertension: Secondary | ICD-10-CM

## 2020-07-27 ENCOUNTER — Other Ambulatory Visit: Payer: Self-pay | Admitting: Family Medicine

## 2020-07-27 DIAGNOSIS — E785 Hyperlipidemia, unspecified: Secondary | ICD-10-CM

## 2020-08-27 ENCOUNTER — Other Ambulatory Visit: Payer: Self-pay | Admitting: Family Medicine

## 2020-08-27 DIAGNOSIS — I10 Essential (primary) hypertension: Secondary | ICD-10-CM

## 2020-09-21 ENCOUNTER — Telehealth: Payer: Self-pay

## 2020-09-21 NOTE — Telephone Encounter (Signed)
Pt called. Levemir was delivered. Placed in refrigerator. LVDM and advised we close at 5pm to day and closed Monday

## 2020-09-26 NOTE — Telephone Encounter (Signed)
Patient would like to know if ozempic was also order?

## 2020-09-26 NOTE — Telephone Encounter (Signed)
Requested a call back from [facility]. Pt called. Advised pt to try calling as well

## 2020-10-06 ENCOUNTER — Other Ambulatory Visit: Payer: Self-pay | Admitting: Family Medicine

## 2020-10-10 ENCOUNTER — Other Ambulatory Visit: Payer: Self-pay | Admitting: Family Medicine

## 2020-10-10 DIAGNOSIS — I10 Essential (primary) hypertension: Secondary | ICD-10-CM

## 2020-10-10 MED ORDER — ENALAPRIL MALEATE 10 MG PO TABS: 10.0000 mg | ORAL_TABLET | Freq: Two times a day (BID) | ORAL | 0 refills | Status: DC

## 2020-10-16 ENCOUNTER — Telehealth: Payer: Self-pay

## 2020-10-16 ENCOUNTER — Other Ambulatory Visit: Payer: Self-pay | Admitting: Family Medicine

## 2020-10-16 DIAGNOSIS — I10 Essential (primary) hypertension: Secondary | ICD-10-CM

## 2020-10-16 MED ORDER — ENALAPRIL MALEATE 10 MG PO TABS: 10.0000 mg | ORAL_TABLET | Freq: Two times a day (BID) | ORAL | 0 refills | Status: DC

## 2020-10-16 NOTE — Telephone Encounter (Signed)
Refill sent.

## 2020-10-16 NOTE — Telephone Encounter (Signed)
Called pt to let her know that her Ozempic- pt assistance was in and ready to be picked up. She will come get it.  Pt also asked for a refill on the Enalapril. She says the last Rx was only for 15 days and would not last her until her 10/30/20 appointment. I let her know I would check before sending the prescription (since I didn't know the reasoning behind the 15d Rx)- she uses [facility] [place].

## 2020-10-18 NOTE — Telephone Encounter (Signed)
Pt has picked up her Pt assistance.

## 2020-10-30 ENCOUNTER — Other Ambulatory Visit: Payer: Self-pay

## 2020-10-30 ENCOUNTER — Ambulatory Visit (INDEPENDENT_AMBULATORY_CARE_PROVIDER_SITE_OTHER): Payer: Medicare HMO | Admitting: Family Medicine

## 2020-10-30 ENCOUNTER — Encounter: Payer: Self-pay | Admitting: Family Medicine

## 2020-10-30 VITALS — BP 154/76 | HR 91 | Temp 99.0°F | Resp 18 | Ht 63.0 in | Wt 233.8 lb

## 2020-10-30 DIAGNOSIS — E785 Hyperlipidemia, unspecified: Secondary | ICD-10-CM

## 2020-10-30 DIAGNOSIS — E1151 Type 2 diabetes mellitus with diabetic peripheral angiopathy without gangrene: Secondary | ICD-10-CM

## 2020-10-30 DIAGNOSIS — IMO0002 Reserved for concepts with insufficient information to code with codable children: Secondary | ICD-10-CM

## 2020-10-30 DIAGNOSIS — E1165 Type 2 diabetes mellitus with hyperglycemia: Secondary | ICD-10-CM

## 2020-10-30 DIAGNOSIS — E1169 Type 2 diabetes mellitus with other specified complication: Secondary | ICD-10-CM | POA: Diagnosis not present

## 2020-10-30 DIAGNOSIS — I10 Essential (primary) hypertension: Secondary | ICD-10-CM

## 2020-10-30 LAB — LIPID PANEL
Cholesterol: 126 mg/dL (ref 0–200)
HDL: 44 mg/dL (ref >39.00)
NonHDL: 81.79
Total CHOL/HDL Ratio: 3
Triglycerides: 217 mg/dL — ABNORMAL HIGH (ref 0.0–149.0)
VLDL: 43.4 mg/dL — ABNORMAL HIGH (ref 0.0–40.0)

## 2020-10-30 LAB — MICROALBUMIN / CREATININE URINE RATIO
Creatinine,U: 103.6 mg/dL
Microalb Creat Ratio: 30.3 mg/g — ABNORMAL HIGH (ref 0.0–30.0)
Microalb, Ur: 31.4 mg/dL — ABNORMAL HIGH (ref 0.0–1.9)

## 2020-10-30 LAB — COMPREHENSIVE METABOLIC PANEL
ALT: 24 U/L (ref 0–35)
AST: 22 U/L (ref 0–37)
Albumin: 4.5 g/dL (ref 3.5–5.2)
Alkaline Phosphatase: 64 U/L (ref 39–117)
BUN: 24 mg/dL — ABNORMAL HIGH (ref 6–23)
CO2: 27 mEq/L (ref 19–32)
Calcium: 10.6 mg/dL — ABNORMAL HIGH (ref 8.4–10.5)
Chloride: 100 mEq/L (ref 96–112)
Creatinine, Ser: 1.33 mg/dL — ABNORMAL HIGH (ref 0.40–1.20)
GFR: 41.3 mL/min — ABNORMAL LOW (ref >60.00)
Glucose, Bld: 205 mg/dL — ABNORMAL HIGH (ref 70–99)
Potassium: 4.4 mEq/L (ref 3.5–5.1)
Sodium: 137 mEq/L (ref 135–145)
Total Bilirubin: 0.6 mg/dL (ref 0.2–1.2)
Total Protein: 7 g/dL (ref 6.0–8.3)

## 2020-10-30 LAB — HEMOGLOBIN A1C: Hgb A1c MFr Bld: 9.4 % — ABNORMAL HIGH (ref 4.6–6.5)

## 2020-10-30 LAB — LDL CHOLESTEROL, DIRECT: Direct LDL: 52 mg/dL

## 2020-10-30 MED ORDER — METOPROLOL SUCCINATE ER 50 MG PO TB24: 50.0000 mg | ORAL_TABLET | Freq: Every day | ORAL | 2 refills | Status: DC

## 2020-10-30 MED ORDER — ATORVASTATIN CALCIUM 80 MG PO TABS
80.0000 mg | ORAL_TABLET | Freq: Every day | ORAL | 3 refills | Status: DC
Start: 2020-10-30 — End: 2021-10-10

## 2020-10-30 NOTE — Assessment & Plan Note (Signed)
Well controlled, no changes to meds. Encouraged heart healthy diet such as the DASH diet and exercise as tolerated.  °

## 2020-10-30 NOTE — Assessment & Plan Note (Signed)
Tolerating statin, encouraged heart healthy diet, avoid trans fats, minimize simple carbs and saturated fats. Increase exercise as tolerated 

## 2020-10-30 NOTE — Progress Notes (Signed)
Patient ID: Jane Young, female    DOB: Jan 25, 1951  Age: 70 y.o. MRN: [id number]    Subjective:  Subjective  HPI Jane Young presents for possible side effects to metoprolol and crestor.  She feels exhausted and weak and would like to switch back.    No other complaints except she is under a lot of stress and over her mother with dementia   She has a lot of appointments and can not make other app right now which is why she refused endo appointment --- she understands the risk of not getting her sugars under control.    Review of Systems  Constitutional: Negative for appetite change, diaphoresis, fatigue and unexpected weight change.  Eyes: Negative for pain, redness and visual disturbance.  Respiratory: Negative for cough, chest tightness, shortness of breath and wheezing.   Cardiovascular: Negative for chest pain, palpitations and leg swelling.  Endocrine: Negative for cold intolerance, heat intolerance, polydipsia, polyphagia and polyuria.  Genitourinary: Negative for difficulty urinating, dysuria and frequency.  Neurological: Negative for dizziness, light-headedness, numbness and headaches.    History Past Medical History:  Diagnosis Date  . Diabetes mellitus    Type 2  . Heart murmur   . Hyperlipidemia   . Hypertension     She has a past surgical history that includes Ovarian cyst removal; Cesarean section (1995); and Appendectomy.   Her family history includes Cancer in her father and maternal grandmother; Diabetes in her paternal grandmother; Heart disease in her paternal grandmother; Hyperlipidemia in her mother; Hypertension in her mother.She reports that she has never smoked. She has never used smokeless tobacco. She reports current alcohol use. She reports that she does not use drugs.  Current Outpatient Medications on File Prior to Visit  Medication Sig Dispense Refill  . amLODipine (NORVASC) 10 MG tablet TAKE ONE TABLET BY MOUTH DAILY 90 tablet 1  . aspirin EC 81 MG  tablet Take 81 mg by mouth daily. Swallow whole.    . blood glucose meter kit and supplies KIT Dispense based on patient and insurance preference. Use up to four times daily as directed. (FOR ICD-9 250.00, 250.01). 1 each 0  . enalapril (VASOTEC) 10 MG tablet Take 1 tablet (10 mg total) by mouth 2 (two) times daily. 30 tablet 0  . furosemide (LASIX) 20 MG tablet TAKE ONE TABLET BY MOUTH DAILY **APPOINTMENT NEEDED** 90 tablet 1  . Insulin Pen Needle (B-D UF III MINI PEN NEEDLES) 31G X 5 MM MISC use as directed 300 each 1  . LEVEMIR FLEXTOUCH 100 UNIT/ML Pen INJECT 65 UNITS UNDER THE SKIN DAILY 15 mL 0  . metFORMIN (GLUCOPHAGE) 1000 MG tablet TAKE ONE TABLET BY MOUTH TWICE A DAY WITH A MEAL **APPOINTMENT NEEDED FOR REFILLS** 180 tablet 2  . Multiple Vitamin (MULTIVITAMIN) tablet Take 1 tablet by mouth daily.    . Omega-3 Fatty Acids (FISH OIL) 1000 MG CAPS Take by mouth.    . Semaglutide, 1 MG/DOSE, (OZEMPIC, 1 MG/DOSE,) 2 MG/1.5ML SOPN Inject 1 mg into the skin once a week. 1.5 mL 2   No current facility-administered medications on file prior to visit.     Objective:  Objective  Physical Exam Vitals and nursing note reviewed.  Constitutional:      Appearance: She is well-developed and well-nourished.  HENT:     Head: Normocephalic and atraumatic.  Eyes:     Extraocular Movements: EOM normal.     Conjunctiva/sclera: Conjunctivae normal.  Neck:     Thyroid: No thyromegaly.  Vascular: No carotid bruit or JVD.  Cardiovascular:     Rate and Rhythm: Normal rate and regular rhythm.     Heart sounds: Normal heart sounds. No murmur heard.   Pulmonary:     Effort: Pulmonary effort is normal. No respiratory distress.     Breath sounds: Normal breath sounds. No wheezing or rales.  Chest:     Chest wall: No tenderness.  Musculoskeletal:        General: No edema.     Cervical back: Normal range of motion and neck supple.  Neurological:     Mental Status: She is alert and oriented to  person, place, and time.  Psychiatric:        Mood and Affect: Mood and affect normal.    Diabetic Foot Exam - Simple   Simple Foot Form Diabetic Foot exam was performed with the following findings: Yes 10/30/2020  1:52 PM  Visual Inspection No deformities, no ulcerations, no other skin breakdown bilaterally: Yes Sensation Testing Intact to touch and monofilament testing bilaterally: Yes Pulse Check Posterior Tibialis and Dorsalis pulse intact bilaterally: Yes Comments     BP (!) 154/76 (BP Location: Right Arm, Patient Position: Sitting, Cuff Size: Large)   Pulse 91   Temp 99 F (37.2 C) (Oral)   Resp 18   Ht 5' 3" (1.6 m)   Wt 233 lb 12.8 oz (106.1 kg)   SpO2 99%   BMI 41.42 kg/m  Wt Readings from Last 3 Encounters:  10/30/20 233 lb 12.8 oz (106.1 kg)  04/06/20 (!) 231 lb (104.8 kg)  08/01/19 225 lb 9.6 oz (102.3 kg)     Lab Results  Component Value Date   WBC 6.1 01/15/2016   HGB 14.5 01/15/2016   HCT 43.9 01/15/2016   PLT 234.0 01/15/2016   GLUCOSE 155 (H) 04/06/2020   CHOL 179 04/06/2020   TRIG 144.0 04/06/2020   HDL 49.20 04/06/2020   LDLDIRECT 114.0 07/25/2013   LDLCALC 101 (H) 04/06/2020   ALT 30 04/06/2020   AST 24 04/06/2020   NA 138 04/06/2020   K 3.8 04/06/2020   CL 101 04/06/2020   CREATININE 0.99 04/06/2020   BUN 19 04/06/2020   CO2 27 04/06/2020   TSH 2.19 10/04/2009   HGBA1C 7.9 (H) 04/06/2020   MICROALBUR <0.7 08/01/2019    No results found.   Assessment & Plan:  Plan  I have discontinued Jane Young's rosuvastatin and metoprolol tartrate. I am also having her start on atorvastatin and metoprolol succinate. Additionally, I am having her maintain her multivitamin, Insulin Pen Needle, Levemir FlexTouch, blood glucose meter kit and supplies, Ozempic (1 MG/DOSE), Fish Oil, aspirin EC, metFORMIN, amLODipine, furosemide, and enalapril.  Meds ordered this encounter  Medications  . atorvastatin (LIPITOR) 80 MG tablet    Sig: Take 1 tablet  (80 mg total) by mouth daily.    Dispense:  90 tablet    Refill:  3  . metoprolol succinate (TOPROL-XL) 50 MG 24 hr tablet    Sig: Take 1 tablet (50 mg total) by mouth daily. Take with or immediately following a meal.    Dispense:  30 tablet    Refill:  2    Problem List Items Addressed This Visit      Unprioritized   DM (diabetes mellitus) type II uncontrolled, periph vascular disorder ([place])    hgba1c to be checked , minimize simple carbs. Increase exercise as tolerated. Continue current meds       Relevant Medications  atorvastatin (LIPITOR) 80 MG tablet   metoprolol succinate (TOPROL-XL) 50 MG 24 hr tablet   Essential hypertension    Well controlled, no changes to meds. Encouraged heart healthy diet such as the DASH diet and exercise as tolerated.       Relevant Medications   atorvastatin (LIPITOR) 80 MG tablet   metoprolol succinate (TOPROL-XL) 50 MG 24 hr tablet   Hyperlipidemia LDL goal <100    Tolerating statin, encouraged heart healthy diet, avoid trans fats, minimize simple carbs and saturated fats. Increase exercise as tolerated      Relevant Medications   atorvastatin (LIPITOR) 80 MG tablet   metoprolol succinate (TOPROL-XL) 50 MG 24 hr tablet    Other Visit Diagnoses    Hyperlipidemia associated with type 2 diabetes mellitus ([place])    -  Primary   Relevant Medications   atorvastatin (LIPITOR) 80 MG tablet   Other Relevant Orders   Lipid panel   Comprehensive metabolic panel   Hemoglobin A1c   Primary hypertension       Relevant Medications   atorvastatin (LIPITOR) 80 MG tablet   metoprolol succinate (TOPROL-XL) 50 MG 24 hr tablet   Other Relevant Orders   Lipid panel   Comprehensive metabolic panel   Hemoglobin A1c   Uncontrolled type 2 diabetes mellitus with hyperglycemia (H[facility])       Relevant Medications   atorvastatin (LIPITOR) 80 MG tablet   Other Relevant Orders   Lipid panel   Comprehensive metabolic panel   Hemoglobin A1c   Microalbumin /  creatinine urine ratio      Follow-up: Return in about 3 months (around 01/27/2021), or if symptoms worsen or fail to improve, for hypertension, hyperlipidemia, diabetes II.  Ann Held, DO

## 2020-10-30 NOTE — Patient Instructions (Signed)

## 2020-10-30 NOTE — Assessment & Plan Note (Signed)
hgba1c to be checked, minimize simple carbs. Increase exercise as tolerated. Continue current meds  

## 2020-11-05 ENCOUNTER — Other Ambulatory Visit: Payer: Self-pay | Admitting: Family Medicine

## 2020-11-05 DIAGNOSIS — I10 Essential (primary) hypertension: Secondary | ICD-10-CM

## 2020-11-07 ENCOUNTER — Other Ambulatory Visit: Payer: Self-pay

## 2020-11-07 DIAGNOSIS — IMO0002 Reserved for concepts with insufficient information to code with codable children: Secondary | ICD-10-CM

## 2020-11-07 DIAGNOSIS — E1151 Type 2 diabetes mellitus with diabetic peripheral angiopathy without gangrene: Secondary | ICD-10-CM

## 2020-11-07 DIAGNOSIS — E1165 Type 2 diabetes mellitus with hyperglycemia: Secondary | ICD-10-CM

## 2020-11-07 MED ORDER — METFORMIN HCL 1000 MG PO TABS: ORAL_TABLET | ORAL | 2 refills | Status: DC

## 2020-12-26 ENCOUNTER — Telehealth: Payer: Self-pay | Admitting: *Deleted

## 2020-12-26 NOTE — Telephone Encounter (Signed)
Received fax on 12/06/20 that patient was eligible for Levemir refill.  Spoke with patient and she confirmed that she will be needing it.  Refill order form faxed to [facility].

## 2021-01-01 NOTE — Telephone Encounter (Signed)
Call [facility] [facility] to check to make sure Levemir refill request went thru.  Spoke with [facility] and she stated that order went through on 12/27/20 and we should received it by 01/16/21

## 2021-01-17 NOTE — Telephone Encounter (Signed)
Levemir arrived and placed in the frig. Pt called. Left message with pt's husband

## 2021-01-18 NOTE — Telephone Encounter (Signed)
Ozempic arrived this morning. Placed with Levemir in the frig.

## 2021-01-31 ENCOUNTER — Other Ambulatory Visit: Payer: Self-pay

## 2021-01-31 DIAGNOSIS — I10 Essential (primary) hypertension: Secondary | ICD-10-CM

## 2021-01-31 MED ORDER — METOPROLOL SUCCINATE ER 50 MG PO TB24: 50.0000 mg | ORAL_TABLET | Freq: Every day | ORAL | 2 refills | Status: DC

## 2021-04-05 ENCOUNTER — Other Ambulatory Visit: Payer: Self-pay | Admitting: Family Medicine

## 2021-04-05 DIAGNOSIS — I10 Essential (primary) hypertension: Secondary | ICD-10-CM

## 2021-04-09 ENCOUNTER — Telehealth: Payer: Self-pay

## 2021-04-09 NOTE — Telephone Encounter (Signed)
Called pt to let her know that her Levimir has arrived from pt assistance. She says she still has some from the last shipment and asks for shipment history from the company so that she can better keep up with. I will give her customer service number. Do you have any better suggestions?

## 2021-04-09 NOTE — Telephone Encounter (Signed)
No other suggestions.  Agree with her calling customer service and possibly check to make sure she is using as prescribed.

## 2021-05-01 ENCOUNTER — Other Ambulatory Visit: Payer: Self-pay | Admitting: Family Medicine

## 2021-05-01 DIAGNOSIS — I10 Essential (primary) hypertension: Secondary | ICD-10-CM

## 2021-05-02 ENCOUNTER — Telehealth: Payer: Self-pay | Admitting: Family Medicine

## 2021-05-02 NOTE — Telephone Encounter (Signed)
I reviewed the patient's chart. Pt has Metoprolol 50 MG 1 tab PO daily and Metoprolol 100 Mg 1/2 tab PO three times daily. Please advise which one is correct.

## 2021-05-02 NOTE — Telephone Encounter (Signed)
[facility] called with questions regarding the patient's metoprolol succinate and metoprolol tartrate . Pharmacist wants to make sure they are giving the patient the right one. She can be reached at [phone]

## 2021-05-03 ENCOUNTER — Other Ambulatory Visit: Payer: Self-pay

## 2021-05-03 MED ORDER — METOPROLOL TARTRATE 100 MG PO TABS: ORAL_TABLET | ORAL | 0 refills | Status: DC

## 2021-05-03 NOTE — Telephone Encounter (Signed)
Correct medication renewed and sent to pharmacy

## 2021-05-08 ENCOUNTER — Telehealth: Payer: Self-pay | Admitting: Family Medicine

## 2021-05-08 DIAGNOSIS — I10 Essential (primary) hypertension: Secondary | ICD-10-CM

## 2021-05-08 MED ORDER — FUROSEMIDE 20 MG PO TABS: ORAL_TABLET | ORAL | 1 refills | Status: DC

## 2021-05-08 MED ORDER — ENALAPRIL MALEATE 10 MG PO TABS: 10.0000 mg | ORAL_TABLET | Freq: Two times a day (BID) | ORAL | 1 refills | Status: DC

## 2021-05-08 NOTE — Telephone Encounter (Signed)
Refills sent

## 2021-05-08 NOTE — Telephone Encounter (Signed)
Medication: enalapril (VASOTEC) 10 MG tablet   furosemide (LASIX) 20 MG tablet  Has the patient contacted their pharmacy? Yes.   (If no, request that the patient contact the pharmacy for the refill.) (If yes, when and what did the pharmacy advise?) Pt states pharmacy has been trying to contact our office for refill.  Preferred Pharmacy (with phone number or street name): [id number] [place], [place] - [phone]  [address] [place] [address]  Phone:  [phone]    Agent: Please be advised that RX refills may take up to 3 business days. We ask that you follow-up with your pharmacy.

## 2021-05-10 ENCOUNTER — Telehealth: Payer: Self-pay | Admitting: *Deleted

## 2021-05-10 NOTE — Chronic Care Management (AMB) (Signed)
  Chronic Care Management   Outreach Note  05/10/2021 Name: Dnya Hickle MRN: [id number] DOB: 03/27/1951  Shanan Fitzpatrick is a 70 y.o. year old female who is a primary care patient of Donato Schultz, DO. I reached out to Jillyn Hidden by phone today in response to a referral sent by Ms. Gavin Pound Goldbach's PCP Donato Schultz, DO     An unsuccessful telephone outreach was attempted today. The patient was referred to the case management team for assistance with care management and care coordination.   Follow Up Plan: A HIPAA compliant phone message was left for the patient providing contact information and requesting a return call.  If patient returns call to provider office, please advise to call Embedded Care Management Care Guide Kiahna Banghart at [phone]  Burman Nieves, CCMA Care Guide, Embedded Care Coordination [facility]  Care Management  Direct Dial: [phone]

## 2021-05-15 NOTE — Chronic Care Management (AMB) (Signed)
  Chronic Care Management   Outreach Note  05/15/2021 Name: Jane Young MRN: [id number] DOB: Mar 29, 1951  Jane Young is a 70 y.o. year old female who is a primary care patient of Jane Schultz, DO. I reached out to Jane Young by phone today in response to a referral sent by Jane Young's PCP Jane Schultz, DO     A second unsuccessful telephone outreach was attempted today. The patient was referred to the case management team for assistance with care management and care coordination.   Follow Up Plan: A HIPAA compliant phone message was left for the patient providing contact information and requesting a return call.  If patient returns call to provider office, please advise to call Embedded Care Management Care Guide Cashe Gatt at [phone]  Burman Nieves, CCMA Care Guide, Embedded Care Coordination [facility]  Care Management  Direct Dial: [phone]

## 2021-05-20 NOTE — Chronic Care Management (AMB) (Signed)
  Chronic Care Management   Note  05/20/2021 Name: Jane Young MRN: [id number] DOB: 1951/03/15  Jane Young is a 70 y.o. year old female who is a primary care patient of Ann Held, DO. I reached out to [name] by phone today in response to a referral sent by Jane Young's PCP Ann Held, DO     Jane Young was given information about Chronic Care Management services today including:  CCM service includes personalized support from designated clinical staff supervised by her physician, including individualized plan of care and coordination with other care providers 24/7 contact phone numbers for assistance for urgent and routine care needs. Service will only be billed when office clinical staff spend 20 minutes or more in a month to coordinate care. Only one practitioner may furnish and bill the service in a calendar month. The patient may stop CCM services at any time (effective at the end of the month) by phone call to the office staff. The patient will be responsible for cost sharing (co-pay) of up to 20% of the service fee (after annual deductible is met).  Patient agreed to services and verbal consent obtained.   Follow up plan: Telephone appointment with care management team member scheduled for: 05/30/2021  Julian Hy, [place] Management  Direct Dial: [phone]

## 2021-05-27 ENCOUNTER — Telehealth: Payer: Self-pay | Admitting: Pharmacist

## 2021-05-27 NOTE — Chronic Care Management (AMB) (Signed)
    Chronic Care Management Pharmacy Assistant   Name: Jane Young  MRN: [id number] DOB: 08-16-1951  Jane Young is an 70 y.o. year old female who presents for his initial CCM visit with the clinical pharmacist.  Recent office visits:  10/30/20-Jane Carollee Herter, DO (PCP) Seen for side effects to metoprolol and crestor. Discontounued rosuvastatin and metoprolol tartrate. Start on Atorvastatin and Metoprolol succinate. Labs ordered. Follow up in 3 months.  Recent consult visits:  None noted  Hospital visits:  None in previous 6 months  Medications: Outpatient Encounter Medications as of 05/27/2021  Medication Sig   amLODipine (NORVASC) 10 MG tablet TAKE ONE TABLET BY MOUTH DAILY   aspirin EC 81 MG tablet Take 81 mg by mouth daily. Swallow whole.   atorvastatin (LIPITOR) 80 MG tablet Take 1 tablet (80 mg total) by mouth daily.   blood glucose meter kit and supplies KIT Dispense based on patient and insurance preference. Use up to four times daily as directed. (FOR ICD-9 250.00, 250.01).   enalapril (VASOTEC) 10 MG tablet Take 1 tablet (10 mg total) by mouth 2 (two) times daily.   furosemide (LASIX) 20 MG tablet TAKE ONE TABLET BY MOUTH DAILY **APPOINTMENT NEEDED**   Insulin Pen Needle (B-D UF III MINI PEN NEEDLES) 31G X 5 MM MISC use as directed   LEVEMIR FLEXTOUCH 100 UNIT/ML Pen INJECT 65 UNITS UNDER THE SKIN DAILY   metFORMIN (GLUCOPHAGE) 1000 MG tablet TAKE ONE TABLET BY MOUTH TWICE A DAY WITH A MEAL   metoprolol tartrate (LOPRESSOR) 100 MG tablet TAKE 1/2 TABLET BY MOUTH THREE TIMES A DAY   Multiple Vitamin (MULTIVITAMIN) tablet Take 1 tablet by mouth daily.   Omega-3 Fatty Acids (FISH OIL) 1000 MG CAPS Take by mouth.   Semaglutide, 1 MG/DOSE, (OZEMPIC, 1 MG/DOSE,) 2 MG/1.5ML SOPN Inject 1 mg into the skin once a week.   No facility-administered encounter medications on file as of 05/27/2021.   AmLODipine (NORVASC) 10 MG tablet Last filled:04/07/21 90 DS Aspirin EC 81 MG  tablet Last filled:None noted Atorvastatin (LIPITOR) 80 MG tablet Last filled:05/03/21 70 DS Enalapril (VASOTEC) 10 MG tablet Last filled:05/08/21 70 DS Furosemide (LASIX) 20 MG tablet Last filled:05/08/21 70 DS LEVEMIR FLEXTOUCH 100 UNIT/ML Pen Last filled:02/07/19 23 DS MetFORMIN (GLUCOPHAGE) 1000 MG tablet Last filled:04/07/21 90 DS Metoprolol tartrate (LOPRESSOR) 100 MG tablet Last filled:04/07/21 90 DS Semaglutide, 1 MG/DOSE, (OZEMPIC, 1 MG/DOSE,) 2 MG/1.5ML SOPN Last filled:02/09/19 28 DS    Star Rating Drugs: Atorvastatin (LIPITOR) 80 MG tablet Last filled:05/03/21 70 DS Enalapril (VASOTEC) 10 MG tablet Last filled:05/08/21 70 DS MetFORMIN (GLUCOPHAGE) 1000 MG tablet Last filled:04/07/21 90 DS Semaglutide, 1 MG/DOSE, (OZEMPIC, 1 MG/DOSE,) 2 MG/1.5ML SOPN Last filled:02/09/19 28 DS  Care Gaps Zoster Vaccines- Shingrix:Never done OPHTHALMOLOGY EXAM:Last completed: Jun 25, 2011 COLONOSCOPY:Last completed: Oct 04, 2009 COVID-19 Vaccine:Last completed: Oct 21, 2019 INFLUENZA VACCINE:Last completed: Jul 08, 2019 HEMOGLOBIN A1C:Last completed: Oct 30, 2020   Jane Young, Jane Young

## 2021-05-28 ENCOUNTER — Other Ambulatory Visit: Payer: Self-pay

## 2021-05-28 ENCOUNTER — Ambulatory Visit (INDEPENDENT_AMBULATORY_CARE_PROVIDER_SITE_OTHER): Payer: Medicare HMO | Admitting: Family Medicine

## 2021-05-28 ENCOUNTER — Telehealth: Payer: Self-pay | Admitting: Family Medicine

## 2021-05-28 ENCOUNTER — Encounter: Payer: Self-pay | Admitting: Family Medicine

## 2021-05-28 VITALS — BP 140/82 | HR 94 | Temp 98.2°F | Resp 18 | Ht 63.0 in | Wt 228.8 lb

## 2021-05-28 DIAGNOSIS — E1169 Type 2 diabetes mellitus with other specified complication: Secondary | ICD-10-CM | POA: Diagnosis not present

## 2021-05-28 DIAGNOSIS — E1151 Type 2 diabetes mellitus with diabetic peripheral angiopathy without gangrene: Secondary | ICD-10-CM

## 2021-05-28 DIAGNOSIS — E1165 Type 2 diabetes mellitus with hyperglycemia: Secondary | ICD-10-CM

## 2021-05-28 DIAGNOSIS — IMO0002 Reserved for concepts with insufficient information to code with codable children: Secondary | ICD-10-CM

## 2021-05-28 DIAGNOSIS — Z1211 Encounter for screening for malignant neoplasm of colon: Secondary | ICD-10-CM | POA: Diagnosis not present

## 2021-05-28 DIAGNOSIS — E785 Hyperlipidemia, unspecified: Secondary | ICD-10-CM

## 2021-05-28 DIAGNOSIS — I10 Essential (primary) hypertension: Secondary | ICD-10-CM | POA: Diagnosis not present

## 2021-05-28 LAB — LIPID PANEL
Cholesterol: 170 mg/dL (ref 0–200)
HDL: 50.5 mg/dL (ref >39.00)
LDL Cholesterol: 83 mg/dL (ref 0–99)
NonHDL: 119.38
Total CHOL/HDL Ratio: 3
Triglycerides: 180 mg/dL — ABNORMAL HIGH (ref 0.0–149.0)
VLDL: 36 mg/dL (ref 0.0–40.0)

## 2021-05-28 LAB — COMPREHENSIVE METABOLIC PANEL
ALT: 41 U/L — ABNORMAL HIGH (ref 0–35)
AST: 37 U/L (ref 0–37)
Albumin: 4.5 g/dL (ref 3.5–5.2)
Alkaline Phosphatase: 63 U/L (ref 39–117)
BUN: 18 mg/dL (ref 6–23)
CO2: 28 mEq/L (ref 19–32)
Calcium: 10.5 mg/dL (ref 8.4–10.5)
Chloride: 101 mEq/L (ref 96–112)
Creatinine, Ser: 1.01 mg/dL (ref 0.40–1.20)
GFR: 57.24 mL/min — ABNORMAL LOW (ref >60.00)
Glucose, Bld: 113 mg/dL — ABNORMAL HIGH (ref 70–99)
Potassium: 4.6 mEq/L (ref 3.5–5.1)
Sodium: 139 mEq/L (ref 135–145)
Total Bilirubin: 0.8 mg/dL (ref 0.2–1.2)
Total Protein: 7.2 g/dL (ref 6.0–8.3)

## 2021-05-28 LAB — HEMOGLOBIN A1C: Hgb A1c MFr Bld: 8.5 % — ABNORMAL HIGH (ref 4.6–6.5)

## 2021-05-28 MED ORDER — BD PEN NEEDLE MINI U/F 31G X 5 MM MISC: 1 refills | Status: DC

## 2021-05-28 MED ORDER — BLOOD GLUCOSE MONITOR KIT: PACK | 0 refills | Status: DC

## 2021-05-28 NOTE — Telephone Encounter (Signed)
[facility]:[place]: [phone]  They need to know how many times a day pt. Is to use medication below in order to be able to bill to insurance.  Insulin Pen Needle (B-D UF III MINI PEN NEEDLES) 31G X 5 MM MISC

## 2021-05-28 NOTE — Assessment & Plan Note (Signed)
Well controlled, no changes to meds. Encouraged heart healthy diet such as the DASH diet and exercise as tolerated.  °

## 2021-05-28 NOTE — Assessment & Plan Note (Signed)
Encourage heart healthy diet such as MIND or DASH diet, increase exercise, avoid trans fats, simple carbohydrates and processed foods, consider a krill or fish or flaxseed oil cap daily.  °

## 2021-05-28 NOTE — Progress Notes (Signed)
Subjective:   By signing my name below, I, Shehryar Baig, attest that this documentation has been prepared under the direction and in the presence of Ann Held, DO  05/28/2021    Patient ID: Jane Young, female    DOB: 02-21-1951, 70 y.o.   MRN: [id number]  Chief Complaint  Patient presents with   Hypertension   Hyperlipidemia   Diabetes   Follow-up    HPI Patient is in today for office visit.  She is requesting needles for her levemir flextouch and ozempic. Otherwise she is UTD on her other medications.  Her blood pressure is doing well during this visit. She does not regularly check her blood pressure at home. She did not receive her blood pressure monitor prescription. Her blood pressure measures normal when she does measure it at home. She continues taking 20 mg lasix daily PO, 10 mg amlodipine daily PO, 10 mg enalapril daily PO and reports no new issues while taking it.   BP Readings from Last 3 Encounters:  05/28/21 140/82  10/30/20 (!) 154/76  04/06/20 (!) 190/77   Pulse Readings from Last 3 Encounters:  05/28/21 94  10/30/20 91  04/06/20 92   She continues taking 80 mg atorvastatin and reports doing well while taking it. She notes that her fatigue has improved since switching to it. She is exercising regularly since taking atorvastatin and reports losing 4 lb's.   Lab Results  Component Value Date   CHOL 126 10/30/2020   HDL 44.00 10/30/2020   LDLCALC 101 (H) 04/06/2020   LDLDIRECT 52.0 10/30/2020   TRIG 217.0 (H) 10/30/2020   CHOLHDL 3 10/30/2020   She is interested in getting the flu vaccine at her pharmacy. She is interested in getting the new Covid-19 booster vaccine at her pharmacy. She is eligible for the shingrix vaccine and is interested in getting it at her pharmacy.  She is due for vision care. She is due for a colonoscopy. She has not ever completed a colonoscopy at this time.    Past Medical History:  Diagnosis Date   Diabetes  mellitus    Type 2   Heart murmur    Hyperlipidemia    Hypertension     Past Surgical History:  Procedure Laterality Date   APPENDECTOMY     CESAREAN SECTION  1995   OVARIAN CYST REMOVAL      Family History  Problem Relation Age of Onset   Hyperlipidemia Mother    Hypertension Mother    Cancer Father        kidney   Cancer Maternal Grandmother    Diabetes Paternal Grandmother    Heart disease Paternal Grandmother     Social History   Socioeconomic History   Marital status: Married    Spouse name: Not on file   Number of children: Not on file   Years of education: Not on file   Highest education level: Not on file  Occupational History   Not on file  Tobacco Use   Smoking status: Never   Smokeless tobacco: Never  Substance and Sexual Activity   Alcohol use: Yes   Drug use: No   Sexual activity: Not on file  Other Topics Concern   Not on file  Social History Narrative   Not on file   Social Determinants of Health   Financial Resource Strain: Not on file  Food Insecurity: Not on file  Transportation Needs: Not on file  Physical Activity: Not on file  Stress: Not on file  Social Connections: Not on file  Intimate Partner Violence: Not on file    Outpatient Medications Prior to Visit  Medication Sig Dispense Refill   amLODipine (NORVASC) 10 MG tablet TAKE ONE TABLET BY MOUTH DAILY 90 tablet 1   aspirin EC 81 MG tablet Take 81 mg by mouth daily. Swallow whole.     atorvastatin (LIPITOR) 80 MG tablet Take 1 tablet (80 mg total) by mouth daily. 90 tablet 3   blood glucose meter kit and supplies KIT Dispense based on patient and insurance preference. Use up to four times daily as directed. (FOR ICD-9 250.00, 250.01). 1 each 0   enalapril (VASOTEC) 10 MG tablet Take 1 tablet (10 mg total) by mouth 2 (two) times daily. 180 tablet 1   furosemide (LASIX) 20 MG tablet TAKE ONE TABLET BY MOUTH DAILY **APPOINTMENT NEEDED** 90 tablet 1   LEVEMIR FLEXTOUCH 100 UNIT/ML  Pen INJECT 65 UNITS UNDER THE SKIN DAILY 15 mL 0   metFORMIN (GLUCOPHAGE) 1000 MG tablet TAKE ONE TABLET BY MOUTH TWICE A DAY WITH A MEAL 180 tablet 2   Multiple Vitamin (MULTIVITAMIN) tablet Take 1 tablet by mouth daily.     Omega-3 Fatty Acids (FISH OIL) 1000 MG CAPS Take by mouth.     Semaglutide, 1 MG/DOSE, (OZEMPIC, 1 MG/DOSE,) 2 MG/1.5ML SOPN Inject 1 mg into the skin once a week. 1.5 mL 2   Insulin Pen Needle (B-D UF III MINI PEN NEEDLES) 31G X 5 MM MISC use as directed 300 each 1   metoprolol tartrate (LOPRESSOR) 100 MG tablet TAKE 1/2 TABLET BY MOUTH THREE TIMES A DAY 135 tablet 0   No facility-administered medications prior to visit.    No Known Allergies  Review of Systems  Constitutional:  Negative for fever and malaise/fatigue.  HENT:  Negative for congestion.   Eyes:  Negative for blurred vision.  Respiratory:  Negative for shortness of breath.   Cardiovascular:  Negative for chest pain, palpitations and leg swelling.  Gastrointestinal:  Negative for abdominal pain, blood in stool and nausea.  Genitourinary:  Negative for dysuria and frequency.  Musculoskeletal:  Negative for falls.  Skin:  Negative for rash.  Neurological:  Negative for dizziness, loss of consciousness and headaches.  Endo/Heme/Allergies:  Negative for environmental allergies.  Psychiatric/Behavioral:  Negative for depression. The patient is not nervous/anxious.       Objective:    Physical Exam Vitals and nursing note reviewed.  Constitutional:      Appearance: She is well-developed.  HENT:     Head: Normocephalic and atraumatic.  Eyes:     Conjunctiva/sclera: Conjunctivae normal.  Neck:     Thyroid: No thyromegaly.     Vascular: No carotid bruit or JVD.  Cardiovascular:     Rate and Rhythm: Normal rate and regular rhythm.     Heart sounds: Normal heart sounds. No murmur heard. Pulmonary:     Effort: Pulmonary effort is normal. No respiratory distress.     Breath sounds: Normal breath  sounds. No wheezing or rales.  Chest:     Chest wall: No tenderness.  Musculoskeletal:     Cervical back: Normal range of motion and neck supple.  Neurological:     Mental Status: She is alert and oriented to person, place, and time.    BP 140/82 (BP Location: Left Arm, Patient Position: Sitting, Cuff Size: Large)   Pulse 94   Temp 98.2 F (36.8 C) (Oral)   Resp 18  Ht _0  (1.6 m)   Wt 228 lb 12.8 oz (103.8 kg)   SpO2 99%   BMI 40.53 kg/m  Wt Readings from Last 3 Encounters:  05/28/21 228 lb 12.8 oz (103.8 kg)  10/30/20 233 lb 12.8 oz (106.1 kg)  04/06/20 (!) 231 lb (104.8 kg)    Diabetic Foot Exam - Simple   No data filed    Lab Results  Component Value Date   WBC 6.1 01/15/2016   HGB 14.5 01/15/2016   HCT 43.9 01/15/2016   PLT 234.0 01/15/2016   GLUCOSE 205 (H) 10/30/2020   CHOL 126 10/30/2020   TRIG 217.0 (H) 10/30/2020   HDL 44.00 10/30/2020   LDLDIRECT 52.0 10/30/2020   LDLCALC 101 (H) 04/06/2020   ALT 24 10/30/2020   AST 22 10/30/2020   NA 137 10/30/2020   K 4.4 10/30/2020   CL 100 10/30/2020   CREATININE 1.33 (H) 10/30/2020   BUN 24 (H) 10/30/2020   CO2 27 10/30/2020   TSH 2.19 10/04/2009   HGBA1C 9.4 (H) 10/30/2020   MICROALBUR 31.4 (H) 10/30/2020    Lab Results  Component Value Date   TSH 2.19 10/04/2009   Lab Results  Component Value Date   WBC 6.1 01/15/2016   HGB 14.5 01/15/2016   HCT 43.9 01/15/2016   MCV 85.1 01/15/2016   PLT 234.0 01/15/2016   Lab Results  Component Value Date   NA 137 10/30/2020   K 4.4 10/30/2020   CO2 27 10/30/2020   GLUCOSE 205 (H) 10/30/2020   BUN 24 (H) 10/30/2020   CREATININE 1.33 (H) 10/30/2020   BILITOT 0.6 10/30/2020   ALKPHOS 64 10/30/2020   AST 22 10/30/2020   ALT 24 10/30/2020   PROT 7.0 10/30/2020   ALBUMIN 4.5 10/30/2020   CALCIUM 10.6 (H) 10/30/2020   GFR 41.30 (L) 10/30/2020   Lab Results  Component Value Date   CHOL 126 10/30/2020   Lab Results  Component Value Date   HDL  44.00 10/30/2020   Lab Results  Component Value Date   LDLCALC 101 (H) 04/06/2020   Lab Results  Component Value Date   TRIG 217.0 (H) 10/30/2020   Lab Results  Component Value Date   CHOLHDL 3 10/30/2020   Lab Results  Component Value Date   HGBA1C 9.4 (H) 10/30/2020       Assessment & Plan:   Problem List Items Addressed This Visit       Unprioritized   DM (diabetes mellitus) type II uncontrolled, periph vascular disorder ([place])    hgba1c to be checked , minimize simple carbs. Increase exercise as tolerated. Continue current meds      Essential hypertension    Well controlled, no changes to meds. Encouraged heart healthy diet such as the DASH diet and exercise as tolerated.       Hyperlipidemia LDL goal <100    Encourage heart healthy diet such as MIND or DASH diet, increase exercise, avoid trans fats, simple carbohydrates and processed foods, consider a krill or fish or flaxseed oil cap daily.       Other Visit Diagnoses     Uncontrolled type 2 diabetes mellitus with hyperglycemia ([place])    -  Primary   Relevant Medications   Insulin Pen Needle (B-D UF III MINI PEN NEEDLES) 31G X 5 MM MISC   blood glucose meter kit and supplies KIT   Other Relevant Orders   Hemoglobin A1c   Comprehensive metabolic panel   Lipid panel  Hyperlipidemia associated with type 2 diabetes mellitus ([place])       Relevant Orders   Hemoglobin A1c   Comprehensive metabolic panel   Lipid panel   Primary hypertension       Relevant Orders   Hemoglobin A1c   Comprehensive metabolic panel   Lipid panel   Colon cancer screening       Relevant Orders   Ambulatory referral to Gastroenterology        Meds ordered this encounter  Medications   Insulin Pen Needle (B-D UF III MINI PEN NEEDLES) 31G X 5 MM MISC    Sig: use as directed    Dispense:  300 each    Refill:  1   blood glucose meter kit and supplies KIT    Sig: As directed qd    Dispense:  1 each    Refill:  0    Order  Specific Question:   Number of strips    Answer:   100    Order Specific Question:   Number of lancets    Answer:   100    I, Ann Held, DO, personally preformed the services described in this documentation.  All medical record entries made by the scribe were at my direction and in my presence.  I have reviewed the chart and discharge instructions (if applicable) and agree that the record reflects my personal performance and is accurate and complete. 05/28/2021   I,Shehryar Baig,acting as a scribe for Ann Held, DO.,have documented all relevant documentation on the behalf of Ann Held, DO,as directed by  Ann Held, DO while in the presence of Ann Held, DO.   Ann Held, DO

## 2021-05-28 NOTE — Telephone Encounter (Signed)
Rx resent w/ sig to use once daily w/ Levemir.

## 2021-05-28 NOTE — Assessment & Plan Note (Signed)
hgba1c to be checked, minimize simple carbs. Increase exercise as tolerated. Continue current meds  

## 2021-05-28 NOTE — Patient Instructions (Signed)
Carbohydrate Counting for Diabetes Mellitus, Adult Carbohydrate counting is a method of keeping track of how many carbohydrates you eat. Eating carbohydrates naturally increases the amount of sugar (glucose) in the blood. Counting how many carbohydrates you eat improves your blood glucose control, which helps you manage your diabetes. It is important to know how many carbohydrates you can safely have in each meal. This is different for every person. A dietitian can help you make a meal plan and calculate how many carbohydrates you should have at each meal and snack. What foods contain carbohydrates? Carbohydrates are found in the following foods: Grains, such as breads and cereals. Dried beans and soy products. Starchy vegetables, such as potatoes, peas, and corn. Fruit and fruit juices. Milk and yogurt. Sweets and snack foods, such as cake, cookies, candy, chips, and soft drinks. How do I count carbohydrates in foods? There are two ways to count carbohydrates in food. You can read food labels or learn standard serving sizes of foods. You can use either of the methods or a combination of both. Using the Nutrition Facts label The Nutrition Facts list is included on the labels of almost all packaged foods and beverages in the [place] It includes: The serving size. Information about nutrients in each serving, including the grams (g) of carbohydrate per serving. To use the Nutrition Facts: Decide how many servings you will have. Multiply the number of servings by the number of carbohydrates per serving. The resulting number is the total amount of carbohydrates that you will be having. Learning the standard serving sizes of foods When you eat carbohydrate foods that are not packaged or do not include Nutrition Facts on the label, you need to measure the servings in order to count the amount of carbohydrates. Measure the foods that you will eat with a food scale or measuring cup, if needed. Decide how  many standard-size servings you will eat. Multiply the number of servings by 15. For foods that contain carbohydrates, one serving equals 15 g of carbohydrates. For example, if you eat 2 cups or 10 oz (300 g) of strawberries, you will have eaten 2 servings and 30 g of carbohydrates (2 servings x 15 g = 30 g). For foods that have more than one food mixed, such as soups and casseroles, you must count the carbohydrates in each food that is included. The following list contains standard serving sizes of common carbohydrate-rich foods. Each of these servings has about 15 g of carbohydrates: 1 slice of bread. 1 six-inch (15 cm) tortilla. ? cup or 2 oz (53 g) cooked rice or pasta.  cup or 3 oz (85 g) cooked or canned, drained and rinsed beans or lentils.  cup or 3 oz (85 g) starchy vegetable, such as peas, corn, or squash.  cup or 4 oz (120 g) hot cereal.  cup or 3 oz (85 g) boiled or mashed potatoes, or  or 3 oz (85 g) of a large baked potato.  cup or 4 fl oz (118 mL) fruit juice. 1 cup or 8 fl oz (237 mL) milk. 1 small or 4 oz (106 g) apple.  or 2 oz (63 g) of a medium banana. 1 cup or 5 oz (150 g) strawberries. 3 cups or 1 oz (24 g) popped popcorn. What is an example of carbohydrate counting? To calculate the number of carbohydrates in this sample meal, follow the steps shown below. Sample meal 3 oz (85 g) chicken breast. ? cup or 4 oz (106 g) brown   rice.  cup or 3 oz (85 g) corn. 1 cup or 8 fl oz (237 mL) milk. 1 cup or 5 oz (150 g) strawberries with sugar-free whipped topping. Carbohydrate calculation Identify the foods that contain carbohydrates: Rice. Corn. Milk. Strawberries. Calculate how many servings you have of each food: 2 servings rice. 1 serving corn. 1 serving milk. 1 serving strawberries. Multiply each number of servings by 15 g: 2 servings rice x 15 g = 30 g. 1 serving corn x 15 g = 15 g. 1 serving milk x 15 g = 15 g. 1 serving strawberries x 15 g = 15  g. Add together all of the amounts to find the total grams of carbohydrates eaten: 30 g + 15 g + 15 g + 15 g = 75 g of carbohydrates total. What are tips for following this plan? Shopping Develop a meal plan and then make a shopping list. Buy fresh and frozen vegetables, fresh and frozen fruit, dairy, eggs, beans, lentils, and whole grains. Look at food labels. Choose foods that have more fiber and less sugar. Avoid processed foods and foods with added sugars. Meal planning Aim to have the same amount of carbohydrates at each meal and for each snack time. Plan to have regular, balanced meals and snacks. Where to find more information American Diabetes Association: www.diabetes.org Centers for Disease Control and Prevention: www.cdc.gov Summary Carbohydrate counting is a method of keeping track of how many carbohydrates you eat. Eating carbohydrates naturally increases the amount of sugar (glucose) in the blood. Counting how many carbohydrates you eat improves your blood glucose control, which helps you manage your diabetes. A dietitian can help you make a meal plan and calculate how many carbohydrates you should have at each meal and snack. This information is not intended to replace advice given to you by your health care provider. Make sure you discuss any questions you have with your health care provider. Document Revised: 08/25/2019 Document Reviewed: 08/26/2019 Elsevier Patient Education  2021 [employer]  

## 2021-05-30 ENCOUNTER — Ambulatory Visit (INDEPENDENT_AMBULATORY_CARE_PROVIDER_SITE_OTHER): Payer: Medicare HMO | Admitting: Pharmacist

## 2021-05-30 DIAGNOSIS — E1165 Type 2 diabetes mellitus with hyperglycemia: Secondary | ICD-10-CM | POA: Diagnosis not present

## 2021-05-30 DIAGNOSIS — I10 Essential (primary) hypertension: Secondary | ICD-10-CM | POA: Diagnosis not present

## 2021-05-30 DIAGNOSIS — E1169 Type 2 diabetes mellitus with other specified complication: Secondary | ICD-10-CM

## 2021-05-30 DIAGNOSIS — E785 Hyperlipidemia, unspecified: Secondary | ICD-10-CM | POA: Diagnosis not present

## 2021-05-30 NOTE — Patient Instructions (Signed)
Visit Information   PATIENT GOALS:   Goals Addressed             This Visit's Progress    Pharmacy Chronic Care Management Goals       Current Barriers:  Medication assistance needed for brand name medications Unable to independently monitor therapeutic efficacy (glucometer needed)  Unable to achieve control of Type 2 diabetes   Pharmacist Clinical Goal(s):  Over the next 90 days, patient will verbalize ability to afford treatment regimen achieve adherence to monitoring guidelines and medication adherence to achieve therapeutic efficacy achieve control of type 2 diabetes as evidenced by A1c < 7.0% maintain control of cholesterol and hypertension as evidenced by maintaining goals list below  through collaboration with PharmD and provider.   Interventions: 1:1 collaboration with Jane Young, Jane Apa, DO regarding development and update of comprehensive plan of care as evidenced by provider attestation and co-signature Inter-disciplinary care team collaboration (see longitudinal plan of care) Comprehensive medication review performed; medication list updated in electronic medical record  Diabetes: Uncontrolled; A1c goal < 7.0 Last A1c was 8.5% (05/28/2021) - improved compared to A1c from 7 months ago which was 9.4% Current treatment: Ozempic 84m injected subcutaneously weekly on Sunday Levemir insulin 20 units injected each morning and 45 units injected each evening  Interventions:  Reviewed A1c goal Reviewed home blood glucose goals  Fasting blood glucose goal (before meals) = 80 to 130 Blood glucose goal after a meal = less than 180  Patient to purchase glucometer tomorrow (already has prescription from Dr LCarollee Young . Recommended get One Touch Verio - it appears to be covered by her insurance.  Start checking blood glucose once a day at varying times (before and after meals), record and bring to future appointments Verified with HKristopher Oppenheimthat pen needles were covered  (only 1 box or 100) at no cost / $0 copay Patient to pick up Ozempic from our office tomorrow ([facility] [employer] Try to increase frequency of exercise to goal of at least 150 minutest per week Reminded to get yearly diabetic eye exam  Hypertension: Uncontrolled per office blood pressure readings. Blood pressure goal <140/90  BP Readings from Last 3 Encounters:  05/28/21 140/82  10/30/20 (!) 154/76  04/06/20 (!) 190/77  Home blood pressure readings: 120 to 130's / 60 to 79 Current treatment: Enalapril 142mtwice a day Metoprolol ER 5028maily  Amlodipine 41m3mly Furosemide 20mg71mly (also for swelling / fluid retention)  Interventions:  Updated medication list and added metoprolol ER back Continue current medications to lower blood pressure.  Continue to check blood pressure 2 to 3 times per week and record for future appointments Remember blood pressure control is important to prevent heart and kidney disease  Hyperlipidemia: LDL at goal but triglycerides a little above goal LDL goal <100 and Triglycerides <150 Lipid Panel     Component Value Date/Time   CHOL 170 05/28/2021 1102   TRIG 180.0 (H) 05/28/2021 1102   HDL 50.50 05/28/2021 1102   CHOLHDL 3 05/28/2021 1102   VLDL 36.0 05/28/2021 1102   LDLCALC 83 05/28/2021 1102   LDLCALC 119 (H) 10/08/2018 1620   LDLDIRECT 52.0 10/30/2020 1132  Current treatment: Atorvastatin 80mg 66my  Omega 3 Fatty Acids / Fish Oil - 1000mg d51m (helps to lower triglycerides)  Interventions:  Discussed LDL and Triglyceride goals Continue current medication to lower cholesterol Recommended increase frequency of exercise to goal of at least 150 minutest per week  Health Maintenance:  Reviewed vaccine and health maintenance records Interventions:  Recommended annual flu vaccine - patient has plans to get in October Recommended updated bivalent COVID 19 booster. - patient has plan to get in October Recommended Shingrix  vaccine series - will defer to late 2022 or early 2023 Reminded to get annual diabetic eye exam.   Patient Goals/Self-Care Activities Over the next 90 days, patient will:  take medications as prescribed,  check glucose daily, document, and provide at future appointments,  check blood pressure 2 to 3 times per week, document, and provide at future appointments, and  Target a minimum of 150 minutes of moderate intensity exercise weekly  Follow Up Plan: Telephone follow up appointment with care management team member scheduled for:  1 month to check home blood glucose readings        Consent to CCM Services: Jane Young was given information about Chronic Care Management services including:  CCM service includes personalized support from designated clinical staff supervised by her physician, including individualized plan of care and coordination with other care providers 24/7 contact phone numbers for assistance for urgent and routine care needs. Service will only be billed when office clinical staff spend 20 minutes or more in a month to coordinate care. Only one practitioner may furnish and bill the service in a calendar month. The patient may stop CCM services at any time (effective at the end of the month) by phone call to the office staff. The patient will be responsible for cost sharing (co-pay) of up to 20% of the service fee (after annual deductible is met).  Patient agreed to services and verbal consent obtained.   Patient verbalizes understanding of instructions provided today and agrees to view in [place].   Telephone follow up appointment with care management team member scheduled for: 1 month  Cherre Robins, PharmD Clinical Pharmacist [facility]   CLINICAL CARE PLAN: Patient Care Plan: General Pharmacy (Adult)     Problem Identified: Chronic Conditions: diabetes, hypertension; hyperlipidemia   Priority: High  Onset Date: 05/30/2021      Long-Range Goal: Provide education, support and care coordination for medicaiton therapy and chronic conditions.   Start Date: 05/30/2021  Priority: High  Note:   Current Barriers:  Unable to independently afford treatment regimen Unable to independently monitor therapeutic efficacy Unable to achieve control of Type 2 diabetes   Pharmacist Clinical Goal(s):  Over the next 90 days, patient will verbalize ability to afford treatment regimen achieve adherence to monitoring guidelines and medication adherence to achieve therapeutic efficacy achieve control of type 2 diabetes as evidenced by A1c < 7.0% maintain control of cholesterol and hypertension as evidenced by maintaining goals list below  through collaboration with PharmD and provider.   Interventions: 1:1 collaboration with Jane Young, Jane Apa, DO regarding development and update of comprehensive plan of care as evidenced by provider attestation and co-signature Inter-disciplinary care team collaboration (see longitudinal plan of care) Comprehensive medication review performed; medication list updated in electronic medical record  Diabetes: Uncontrolled; A1c goal < 7.0 Last A1c was 8.5% (05/28/2021) - improved compared to A1c from 7 months ago which was 9.4% Current treatment: Ozempic 20m injected subcutaneously weekly Levemir insulin 20 units injected each morning and 45 units injected each evening  Patient is receiving Ozempic and Levemir from [employer] [employer]. Second year she has received. Has order of Ozempic here in office refridge - patient was unaware Patient asked about cost of pen needles that  were sent in earlier this week, she was not sure they were covered by insurance.  Current glucose readings: not checking. Has Rx to get new glucometer Denies hypoglycemic/hyperglycemic symptoms Current diet: patient reports recent vacation with alcohol, eating out and more fried foods that usual Current  exercise: walking 2 or 3 times per week for about 40 minutes Interventions:  Reviewed A1c goal Reviewed home blood glucose goals  Fasting blood glucose goal (before meals) = 80 to 130 Blood glucose goal after a meal = less than 180  Patient to purchase glucometer tomorrow. Recommended get One Touch Verio - it appear to be covered by her insurance.  Verified with Jane Young that pen needles were covered (only 1 box or 100) at no cost.  Patient to pick up Ozempic from PAP in office tomorrow.  Recommended increase frequency of exercise to goal of at least 150 minutest per week Reminded to get yearly diabetic eye exam  Hypertension: Uncontrolled per office blood pressure readings. Patient has been noted to have white coat hypertention.   BP Readings from Last 3 Encounters:  05/28/21 140/82  10/30/20 (!) 154/76  04/06/20 (!) 190/77  Home blood pressure readings: 120 to 130's / 60 to 79 Current treatment: Enalapril 64m twice a day Metoprolol ER 533mdaily  Amlodipine 1060mily Furosemide 76m36mily (also for swelling / fluid retention)  Denies hypotensive/hypertensive symptoms Interventions:  Updated medication list and added metoprolol ER back Recommended patient continue to check blood pressure 2 to 3 times per week and record for future appointments Reminded that blood pressure control is important to prevent heart and kidney disease  Hyperlipidemia: Controlled; LDL goal <100 and Triglycerides <150 Current treatment: Atorvastatin 80mg45mly  Omega 3 Fatty Acids / Fish Oil - 1000mg 103my (helps to lower triglycerides) Medications previously tried: rosuvastatin (caused fatigue)   Interventions:  Discussed LDL and Triglyceride goals Recommended increase frequency of exercise to goal of at least 150 minutest per week  Health Maintenance:  Reviewed vaccine and health maintenance records Interventions:  Recommended annual flu vaccine Recommended updated bivalent COVID 19  booster.  Recommended Shingrix vaccine series Reminded to get annual diabetic eye exam.   Patient Goals/Self-Care Activities Over the next 90 days, patient will:  take medications as prescribed, check glucose daily, document, and provide at future appointments, check blood pressure 2 to 3 times per week, document, and provide at future appointments, and target a minimum of 150 minutes of moderate intensity exercise weekly  Follow Up Plan: Telephone follow up appointment with care management team member scheduled for:  1 month

## 2021-05-30 NOTE — Chronic Care Management (AMB) (Signed)
Chronic Care Management Pharmacy Note  05/30/2021 Name:  Jane Young MRN:  [id number] DOB:  1951/06/18  Subjective: Jane Young is an 70 y.o. year old female who is a primary patient of Ann Held, DO.  The CCM team was consulted for assistance with disease management and care coordination needs.    Engaged with patient by telephone for initial visit in response to provider referral for pharmacy case management and/or care coordination services.   Consent to Services:  The patient was given the following information about Chronic Care Management services today, agreed to services, and gave verbal consent: 1. CCM service includes personalized support from designated clinical staff supervised by the primary care provider, including individualized plan of care and coordination with other care providers 2. 24/7 contact phone numbers for assistance for urgent and routine care needs. 3. Service will only be billed when office clinical staff spend 20 minutes or more in a month to coordinate care. 4. Only one practitioner may furnish and bill the service in a calendar month. 5.The patient may stop CCM services at any time (effective at the end of the month) by phone call to the office staff. 6. The patient will be responsible for cost sharing (co-pay) of up to 20% of the service fee (after annual deductible is met). Patient agreed to services and consent obtained.  Patient Care Team: Carollee Herter, Alferd Apa, DO as PCP - Dutch Quint, PharmD (Pharmacist)  Recent office visits: 05/28/2021 - PCP (Dr Etter Sjogren) F/U chronic conditions. No medication changes.  10/30/20-Yvonne Carollee Herter, DO (PCP) Seen for side effects to metoprolol and crestor. Discontounued rosuvastatin and metoprolol tartrate. Start on Atorvastatin and Metoprolol succinate. Labs ordered. Follow up in 3 months  Recent consult visits: None noted in last 6 months  Hospital visits: None in previous 6  months  Objective:  Lab Results  Component Value Date   CREATININE 1.01 05/28/2021   CREATININE 1.33 (H) 10/30/2020   CREATININE 0.99 04/06/2020    Lab Results  Component Value Date   HGBA1C 8.5 (H) 05/28/2021   Last diabetic Eye exam: No results found for: HMDIABEYEEXA  Last diabetic Foot exam: No results found for: HMDIABFOOTEX      Component Value Date/Time   CHOL 170 05/28/2021 1102   TRIG 180.0 (H) 05/28/2021 1102   HDL 50.50 05/28/2021 1102   CHOLHDL 3 05/28/2021 1102   VLDL 36.0 05/28/2021 1102   LDLCALC 83 05/28/2021 1102   LDLCALC 119 (H) 10/08/2018 1620   LDLDIRECT 52.0 10/30/2020 1132    Hepatic Function Latest Ref Rng & Units 05/28/2021 10/30/2020 04/06/2020  Total Protein 6.0 - 8.3 g/dL 7.2 7.0 7.2  Albumin 3.5 - 5.2 g/dL 4.5 4.5 4.4  AST 0 - 37 U/L 37 22 24  ALT 0 - 35 U/L 41(H) 24 30  Alk Phosphatase 39 - 117 U/L 63 64 61  Total Bilirubin 0.2 - 1.2 mg/dL 0.8 0.6 0.7  Bilirubin, Direct 0.0 - 0.3 mg/dL - - -    Lab Results  Component Value Date/Time   TSH 2.19 10/04/2009 09:41 AM   TSH 1.96 09/19/2008 10:08 AM    CBC Latest Ref Rng & Units 01/15/2016 10/24/2014 07/25/2013  WBC 4.0 - 10.5 K/uL 6.1 6.0 5.5  Hemoglobin 12.0 - 15.0 g/dL 14.5 13.9 14.1  Hematocrit 36.0 - 46.0 % 43.9 41.6 42.5  Platelets 150.0 - 400.0 K/uL 234.0 230.0 220.0    No results found for: VD25OH  Clinical ASCVD:  No  The 10-year ASCVD risk score (Arnett DK, et al., 2019) is: 21.4%   Values used to calculate the score:     Age: 22 years     Sex: Female     Is Non-Hispanic African American: No     Diabetic: Yes     Tobacco smoker: No     Systolic Blood Pressure: 790 mmHg     Is BP treated: Yes     HDL Cholesterol: 50.5 mg/dL     Total Cholesterol: 170 mg/dL     Social History   Tobacco Use  Smoking Status Never  Smokeless Tobacco Never   BP Readings from Last 3 Encounters:  05/28/21 140/82  10/30/20 (!) 154/76  04/06/20 (!) 190/77   Pulse Readings from Last 3  Encounters:  05/28/21 94  10/30/20 91  04/06/20 92   Wt Readings from Last 3 Encounters:  05/28/21 228 lb 12.8 oz (103.8 kg)  10/30/20 233 lb 12.8 oz (106.1 kg)  04/06/20 (!) 231 lb (104.8 kg)    Assessment: Review of patient past medical history, allergies, medications, health status, including review of consultants reports, laboratory and other test data, was performed as part of comprehensive evaluation and provision of chronic care management services.   SDOH:  (Social Determinants of Health) assessments and interventions performed:  SDOH Interventions    Flowsheet Row Most Recent Value  SDOH Interventions   Financial Strain Interventions Intervention Not Indicated  Physical Activity Interventions Other (Comments)  [discussed increasing frequancy of walking]       CCM Care Plan  No Known Allergies  Medications Reviewed Today     Reviewed by Ann Held, DO (Physician) on 05/28/21 at 1221  Med List Status: <None>   Medication Order Taking? Sig Documenting Provider Last Dose Status Informant  amLODipine (NORVASC) 10 MG tablet [id number] Yes TAKE ONE TABLET BY MOUTH DAILY Ann Held, DO Taking Active   aspirin EC 81 MG tablet [id number] Yes Take 81 mg by mouth daily. Swallow whole. [provider] Taking Active   atorvastatin (LIPITOR) 80 MG tablet [id number] Yes Take 1 tablet (80 mg total) by mouth daily. Ann Held, DO Taking Active   blood glucose meter kit and supplies KIT [id number] Yes Dispense based on patient and insurance preference. Use up to four times daily as directed. (FOR ICD-9 250.00, 250.01). Ann Held, DO Taking Active   blood glucose meter kit and supplies KIT [id number] Yes As directed qd Carollee Herter, Alferd Apa, DO  Active   enalapril (VASOTEC) 10 MG tablet [id number] Yes Take 1 tablet (10 mg total) by mouth 2 (two) times daily. Carollee Herter, Kendrick Fries R, DO Taking Active   furosemide (LASIX) 20 MG tablet [id number]  Yes TAKE ONE TABLET BY MOUTH DAILY **APPOINTMENT NEEDED** Ann Held, DO Taking Active   Insulin Pen Needle (B-D UF III MINI PEN NEEDLES) 31G X 5 MM MISC [id number]  use as directed Ann Held, DO  Active   LEVEMIR FLEXTOUCH 100 UNIT/ML Pen [id number] Yes INJECT 65 UNITS UNDER THE SKIN DAILY Carollee Herter, Alferd Apa, DO Taking Active   metFORMIN (GLUCOPHAGE) 1000 MG tablet [id number] Yes TAKE ONE TABLET BY MOUTH TWICE A DAY WITH A MEAL Ann Held, DO Taking Active     Discontinued 05/28/21 1034 (Patient Preference)   Multiple Vitamin (MULTIVITAMIN) tablet [id number] Yes Take 1 tablet by mouth daily. [provider] Taking Active   Omega-3 Fatty Acids ([place])  [place] [id number] Yes Take by mouth. [provider] Taking Active   Semaglutide, 1 MG/DOSE, (OZEMPIC, 1 MG/DOSE,) 2 MG/1.5ML SOPN [id number] Yes Inject 1 mg into the skin once a week. Ann Held, DO Taking Active             Patient Active Problem List   Diagnosis Date Noted   DM (diabetes mellitus) type II uncontrolled, periph vascular disorder ([place]) 10/07/2007   Hyperlipidemia LDL goal <100 10/07/2007   Essential hypertension 10/07/2007   OVARIAN CYST, LEFT 10/07/2007   CARDIAC MURMUR, HX OF 10/07/2007   TONSILLECTOMY AND ADENOIDECTOMY, HX OF 10/07/2007    Immunization History  Administered Date(s) Administered   Influenza-Unspecified 07/08/2019   PFIZER(Purple Top)SARS-COV-2 Vaccination 09/30/2019, 10/21/2019   Pneumococcal Conjugate-13 04/12/2018   Pneumococcal Polysaccharide-23 10/28/2011, 08/01/2019   Tdap 10/28/2011   Zoster, Live 06/12/2015    Conditions to be addressed/monitored: HTN, HLD, Hypertriglyceridemia, and DMII  Care Plan : General Pharmacy (Adult)  Updates made by Cherre Robins, PHARMD since 05/30/2021 12:00 AM     Problem: Chronic Conditions: diabetes, hypertension; hyperlipidemia   Priority: High  Onset Date: 05/30/2021     Long-Range  Goal: Provide education, support and care coordination for medicaiton therapy and chronic conditions.   Start Date: 05/30/2021  Priority: High  Note:   Current Barriers:  Unable to independently afford treatment regimen Unable to independently monitor therapeutic efficacy Unable to achieve control of Type 2 diabetes   Pharmacist Clinical Goal(s):  Over the next 90 days, patient will verbalize ability to afford treatment regimen achieve adherence to monitoring guidelines and medication adherence to achieve therapeutic efficacy achieve control of type 2 diabetes as evidenced by A1c < 7.0% maintain control of cholesterol and hypertension as evidenced by maintaining goals list below  through collaboration with PharmD and provider.   Interventions: 1:1 collaboration with Carollee Herter, Alferd Apa, DO regarding development and update of comprehensive plan of care as evidenced by provider attestation and co-signature Inter-disciplinary care team collaboration (see longitudinal plan of care) Comprehensive medication review performed; medication list updated in electronic medical record  Diabetes: Uncontrolled; A1c goal < 7.0 Last A1c was 8.5% (05/28/2021) - improved compared to A1c from 7 months ago which was 9.4% Current treatment: Ozempic 44m injected subcutaneously weekly Levemir insulin 20 units injected each morning and 45 units injected each evening  Patient is receiving Ozempic and Levemir from [employer] assistance program. Second year she has received. Has order of Ozempic here in office refridge - patient was unaware Patient asked about cost of pen needles that were sent in earlier this week, she was not sure they were covered by insurance.  Current glucose readings: not checking. Has Rx to get new glucometer Denies hypoglycemic/hyperglycemic symptoms Current diet: patient reports recent vacation with alcohol, eating out and more fried foods that usual Current exercise: walking  2 or 3 times per week for about 40 minutes Interventions:  Reviewed A1c goal Reviewed home blood glucose goals  Fasting blood glucose goal (before meals) = 80 to 130 Blood glucose goal after a meal = less than 180  Patient to purchase glucometer tomorrow. Recommended get One Touch Verio - it appear to be covered by her insurance.  Verified with HKristopher Oppenheimthat pen needles were covered (only 1 box or 100) at no cost.  Patient to pick up Ozempic from PAP in office tomorrow.  Recommended increase frequency of exercise to goal of at least 150 minutest per week Reminded to  get yearly diabetic eye exam  Hypertension: Uncontrolled per office blood pressure readings. Patient has been noted to have white coat hypertention.   BP Readings from Last 3 Encounters:  05/28/21 140/82  10/30/20 (!) 154/76  04/06/20 (!) 190/77  Home blood pressure readings: 120 to 130's / 60 to 79 Current treatment: Enalapril 30m twice a day Metoprolol ER 523mdaily  Amlodipine 1053mily Furosemide 45m25mily (also for swelling / fluid retention)  Denies hypotensive/hypertensive symptoms Interventions:  Updated medication list and added metoprolol ER back Recommended patient continue to check blood pressure 2 to 3 times per week and record for future appointments Reminded that blood pressure control is important to prevent heart and kidney disease  Hyperlipidemia: Controlled; LDL goal <100 and Triglycerides <150 Current treatment: Atorvastatin 80mg42mly  Omega 3 Fatty Acids / Fish Oil - 1000mg 73my (helps to lower triglycerides) Medications previously tried: rosuvastatin (caused fatigue)   Interventions:  Discussed LDL and Triglyceride goals Recommended increase frequency of exercise to goal of at least 150 minutest per week  Health Maintenance:  Reviewed vaccine and health maintenance records Interventions:  Recommended annual flu vaccine Recommended updated bivalent COVID 19 booster.  Recommended  Shingrix vaccine series Reminded to get annual diabetic eye exam.   Patient Goals/Self-Care Activities Over the next 90 days, patient will:  take medications as prescribed, check glucose daily, document, and provide at future appointments, check blood pressure 2 to 3 times per week, document, and provide at future appointments, and target a minimum of 150 minutes of moderate intensity exercise weekly  Follow Up Plan: Telephone follow up appointment with care management team member scheduled for:  1 month        Medication Assistance:  Ozempic and Levemir obtained through [employer] assistance program.  Enrollment ends 08/07/2021  Patient's preferred pharmacy is:  [facility] [id number] [place] [address] [id number]: [phone] [address] [place]-[address]: [phone] Up:  Patient agrees to Care Plan and Follow-up.  Plan: Telephone follow up appointment with care management team member scheduled for:  1 month  Alto Gandolfo Cherre RobinsmD Clinical Pharmacist [facility]

## 2021-06-06 ENCOUNTER — Other Ambulatory Visit: Payer: Self-pay

## 2021-06-06 DIAGNOSIS — E1165 Type 2 diabetes mellitus with hyperglycemia: Secondary | ICD-10-CM

## 2021-06-24 ENCOUNTER — Telehealth: Payer: Medicare HMO

## 2021-06-26 ENCOUNTER — Ambulatory Visit (INDEPENDENT_AMBULATORY_CARE_PROVIDER_SITE_OTHER): Payer: Medicare HMO | Admitting: Pharmacist

## 2021-06-26 DIAGNOSIS — E1165 Type 2 diabetes mellitus with hyperglycemia: Secondary | ICD-10-CM | POA: Diagnosis not present

## 2021-06-26 DIAGNOSIS — I10 Essential (primary) hypertension: Secondary | ICD-10-CM | POA: Diagnosis not present

## 2021-06-26 DIAGNOSIS — E1169 Type 2 diabetes mellitus with other specified complication: Secondary | ICD-10-CM

## 2021-06-26 DIAGNOSIS — E785 Hyperlipidemia, unspecified: Secondary | ICD-10-CM | POA: Diagnosis not present

## 2021-06-26 NOTE — Chronic Care Management (AMB) (Signed)
Chronic Care Management Pharmacy Note  06/26/2021 Name:  Jane Young MRN:  [id number] DOB:  04/12/1951  Subjective: Jane Young is an 70 y.o. year old female who is a primary patient of Ann Held, DO.  The CCM team was consulted for assistance with disease management and care coordination needs.    Engaged with patient by telephone for follow up visit in response to provider referral for pharmacy case management and/or care coordination services.   Consent to Services:  The patient was given information about Chronic Care Management services, agreed to services, and gave verbal consent prior to initiation of services.  Please see initial visit note for detailed documentation.   Patient Care Team: Carollee Herter, Alferd Apa, DO as PCP - Dutch Quint, PharmD (Pharmacist)  Recent office visits: 05/28/2021 - PCP (Dr Etter Sjogren) F/U chronic conditions. No medication changes.  10/30/20-Yvonne Carollee Herter, DO (PCP) Seen for side effects to metoprolol and crestor. Discontounued rosuvastatin and metoprolol tartrate. Start on Atorvastatin and Metoprolol succinate. Labs ordered. Follow up in 3 months  Recent consult visits: None noted in last 6 months  Hospital visits: None in previous 6 months  Objective:  Lab Results  Component Value Date   CREATININE 1.01 05/28/2021   CREATININE 1.33 (H) 10/30/2020   CREATININE 0.99 04/06/2020    Lab Results  Component Value Date   HGBA1C 8.5 (H) 05/28/2021   Last diabetic Eye exam: No results found for: HMDIABEYEEXA  Last diabetic Foot exam: No results found for: HMDIABFOOTEX      Component Value Date/Time   CHOL 170 05/28/2021 1102   TRIG 180.0 (H) 05/28/2021 1102   HDL 50.50 05/28/2021 1102   CHOLHDL 3 05/28/2021 1102   VLDL 36.0 05/28/2021 1102   LDLCALC 83 05/28/2021 1102   LDLCALC 119 (H) 10/08/2018 1620   LDLDIRECT 52.0 10/30/2020 1132    Hepatic Function Latest Ref Rng & Units 05/28/2021 10/30/2020 04/06/2020   Total Protein 6.0 - 8.3 g/dL 7.2 7.0 7.2  Albumin 3.5 - 5.2 g/dL 4.5 4.5 4.4  AST 0 - 37 U/L 37 22 24  ALT 0 - 35 U/L 41(H) 24 30  Alk Phosphatase 39 - 117 U/L 63 64 61  Total Bilirubin 0.2 - 1.2 mg/dL 0.8 0.6 0.7  Bilirubin, Direct 0.0 - 0.3 mg/dL - - -    Lab Results  Component Value Date/Time   TSH 2.19 10/04/2009 09:41 AM   TSH 1.96 09/19/2008 10:08 AM    CBC Latest Ref Rng & Units 01/15/2016 10/24/2014 07/25/2013  WBC 4.0 - 10.5 K/uL 6.1 6.0 5.5  Hemoglobin 12.0 - 15.0 g/dL 14.5 13.9 14.1  Hematocrit 36.0 - 46.0 % 43.9 41.6 42.5  Platelets 150.0 - 400.0 K/uL 234.0 230.0 220.0    No results found for: VD25OH  Clinical ASCVD:  No The 10-year ASCVD risk score (Arnett DK, et al., 2019) is: 21.4%   Values used to calculate the score:     Age: 36 years     Sex: Female     Is Non-Hispanic African American: No     Diabetic: Yes     Tobacco smoker: No     Systolic Blood Pressure: 665 mmHg     Is BP treated: Yes     HDL Cholesterol: 50.5 mg/dL     Total Cholesterol: 170 mg/dL     Social History   Tobacco Use  Smoking Status Never  Smokeless Tobacco Never   BP Readings from Last 3 Encounters:  05/28/21 140/82  10/30/20 (!) 154/76  04/06/20 (!) 190/77   Pulse Readings from Last 3 Encounters:  05/28/21 94  10/30/20 91  04/06/20 92   Wt Readings from Last 3 Encounters:  05/28/21 228 lb 12.8 oz (103.8 kg)  10/30/20 233 lb 12.8 oz (106.1 kg)  04/06/20 (!) 231 lb (104.8 kg)    Assessment: Review of patient past medical history, allergies, medications, health status, including review of consultants reports, laboratory and other test data, was performed as part of comprehensive evaluation and provision of chronic care management services.   SDOH:  (Social Determinants of Health) assessments and interventions performed:     CCM Care Plan  No Known Allergies  Medications Reviewed Today     Reviewed by Cherre Robins, PharmD (Pharmacist) on 06/26/21 at 92  Med  List Status: <None>   Medication Order Taking? Sig Documenting Provider Last Dose Status Informant  amLODipine (NORVASC) 10 MG tablet [id number] Yes TAKE ONE TABLET BY MOUTH DAILY Ann Held, DO Taking Active   aspirin EC 81 MG tablet [id number] Yes Take 81 mg by mouth daily. Swallow whole. [provider] Taking Active   atorvastatin (LIPITOR) 80 MG tablet [id number] Yes Take 1 tablet (80 mg total) by mouth daily. Ann Held, DO Taking Active   blood glucose meter kit and supplies KIT [id number]  Dispense based on patient and insurance preference. Use up to four times daily as directed. (FOR ICD-9 250.00, 250.01). Ann Held, DO  Active   blood glucose meter kit and supplies KIT [id number] Yes As directed qd Carollee Herter, Alferd Apa, DO Taking Active   enalapril (VASOTEC) 10 MG tablet [id number] Yes Take 1 tablet (10 mg total) by mouth 2 (two) times daily. Carollee Herter, Kendrick Fries R, DO Taking Active   furosemide (LASIX) 20 MG tablet [id number] Yes TAKE ONE TABLET BY MOUTH DAILY **APPOINTMENT NEEDED**  Patient taking differently: TAKE ONE TABLET BY MOUTH DAILY   Ann Held, DO Taking Active   Insulin Pen Needle (B-D UF III MINI PEN NEEDLES) 31G X 5 MM MISC [id number] Yes Use once daily w/ Levemir Ann Held, DO Taking Active   LEVEMIR FLEXTOUCH 100 UNIT/ML Pen [id number] Yes INJECT 65 UNITS UNDER THE SKIN DAILY  Patient taking differently: 20 units each morning and 45 units each evening   Carollee Herter, Kendrick Fries R, DO Taking Active   metFORMIN (GLUCOPHAGE) 1000 MG tablet [id number] Yes TAKE ONE TABLET BY MOUTH TWICE A DAY WITH A MEAL Ann Held, DO Taking Active            Med Note Antony Contras, Sharra Cayabyab B   Thu May 30, 2021  3:56 PM) Takes with lunch and dinner  metoprolol succinate (TOPROL-XL) 50 MG 24 hr tablet [id number] Yes Take 50 mg by mouth daily. [provider] Taking Active   Multiple Vitamin (MULTIVITAMIN) tablet [id number] Yes Take  1 tablet by mouth daily. [provider] Taking Active   Omega-3 Fatty Acids (FISH OIL) 1000 MG CAPS [id number] Yes Take 1 capsule by mouth daily. [provider] Taking Active   Semaglutide, 1 MG/DOSE, (OZEMPIC, 1 MG/DOSE,) 2 MG/1.5ML SOPN [id number] Yes Inject 1 mg into the skin once a week. Ann Held, DO Taking Active             Patient Active Problem List   Diagnosis Date Noted   DM (diabetes mellitus) type II uncontrolled, periph vascular disorder ([place]) 10/07/2007  Hyperlipidemia LDL goal <100 10/07/2007   Essential hypertension 10/07/2007   OVARIAN CYST, LEFT 10/07/2007   CARDIAC MURMUR, HX OF 10/07/2007   TONSILLECTOMY AND ADENOIDECTOMY, HX OF 10/07/2007    Immunization History  Administered Date(s) Administered   Influenza-Unspecified 07/08/2019   PFIZER(Purple Top)SARS-COV-2 Vaccination 09/30/2019, 10/21/2019   Pneumococcal Conjugate-13 04/12/2018   Pneumococcal Polysaccharide-23 10/28/2011, 08/01/2019   Tdap 10/28/2011   Zoster, Live 06/12/2015    Conditions to be addressed/monitored: HTN, HLD, Hypertriglyceridemia, and DMII  Care Plan : General Pharmacy (Adult)  Updates made by Cherre Robins, PHARMD since 06/26/2021 12:00 AM     Problem: Chronic Conditions: diabetes, hypertension; hyperlipidemia   Priority: High  Onset Date: 05/30/2021     Long-Range Goal: Provide education, support and care coordination for medicaiton therapy and chronic conditions.   Start Date: 05/30/2021  Priority: High  Note:   Current Barriers:  Unable to independently afford treatment regimen Unable to independently monitor therapeutic efficacy Unable to achieve control of Type 2 diabetes   Pharmacist Clinical Goal(s):  Over the next 90 days, patient will verbalize ability to afford treatment regimen achieve adherence to monitoring guidelines and medication adherence to achieve therapeutic efficacy achieve control of type 2 diabetes as evidenced by  A1c < 7.0% maintain control of cholesterol and hypertension as evidenced by maintaining goals list below  through collaboration with PharmD and provider.   Interventions: 1:1 collaboration with Carollee Herter, Alferd Apa, DO regarding development and update of comprehensive plan of care as evidenced by provider attestation and co-signature Inter-disciplinary care team collaboration (see longitudinal plan of care) Comprehensive medication review performed; medication list updated in electronic medical record  Diabetes: Uncontrolled; A1c goal < 7.0 Last A1c was 8.5% (05/28/2021) - improved compared to A1c from 7 months ago which was 9.4% Current treatment: Ozempic 88m injected subcutaneously weekly Levemir insulin 20 units injected each morning and 45 units injected each evening  Patient is receiving Ozempic and Levemir from [employer] assistance program. Second year she has received Current glucose readings: 69 to 187 Patient wore a Libre 3 CGM sensor for 2 weeks that her sister gave her. Most of the time blood glucose was in range but had 6 readings after lunch and dinner that blood glucose was 180 to 190 - usually after meal like fried chicken or fruit Also had 2 lows - 70 and 69 during night. She was not able to forward report today so I could review.  Denies hypoglycemic/hyperglycemic symptoms Current diet: patient reports has learned a lot from wearing Freestyle about foods that increase her blood glucose. She is limiting breaded, fried foods more and also has stopped eating applesauce at it was running her blood glucose up.  Current exercise: walking 2 or 3 times per week for about 40 minutes, weights for 10 minutes and stretching for 10 minutes.  Interventions:  Reviewed A1c goal Reviewed home blood glucose goals  Fasting blood glucose goal (before meals) = 80 to 130 Blood glucose goal after a meal = less than 180  Discussed Freestyle Libre - will not be covered on her insurance  due to only injecting insulin once a day. She is interested in possible purchasing on her own. Gave number to get voucher for $75 / 2 sensors [phone] patient to complete application for 21287for Novo Nordisk patient assistance - Levemir and Ozempic  Recommended increase frequency of exercise to goal of at least 150 minutest per week Reminded to get yearly diabetic eye exam  Hypertension:  Uncontrolled per office blood pressure readings. Patient has been noted to have white coat hypertention.   BP Readings from Last 3 Encounters:  05/28/21 140/82  10/30/20 (!) 154/76  04/06/20 (!) 190/77  Home blood pressure readings: 120 to 130's / 60 to 79 Current treatment: Enalapril 60m twice a day Metoprolol ER 542mdaily  Amlodipine 1043mily Furosemide 61m68mily (also for swelling / fluid retention)  Denies hypotensive/hypertensive symptoms Interventions:  Updated medication list and added metoprolol ER back Recommended patient continue to check blood pressure 2 to 3 times per week and record for future appointments Reminded that blood pressure control is important to prevent heart and kidney disease  Hyperlipidemia: Controlled; LDL goal <100 and Triglycerides <150 Current treatment: Atorvastatin 80mg80mly  Omega 3 Fatty Acids / Fish Oil - 1000mg 55my (helps to lower triglycerides) Medications previously tried: rosuvastatin (caused fatigue)   Interventions:  Discussed LDL and Triglyceride goals Recommended increase frequency of exercise to goal of at least 150 minutest per week  Health Maintenance:  Reviewed vaccine and health maintenance records Interventions:  Recommended annual flu vaccine Recommended updated bivalent COVID 19 booster.  Recommended Shingrix vaccine series - to get in 2023 Reminded to get annual diabetic eye exam.   Patient Goals/Self-Care Activities Over the next 90 days, patient will:  take medications as prescribed, check glucose daily,  document, and provide at future appointments, check blood pressure 2 to 3 times per week, document, and provide at future appointments, and target a minimum of 150 minutes of moderate intensity exercise weekly  Follow Up Plan: Telephone follow up appointment with care management team member scheduled for:  2 to 3 months        Medication Assistance:  Ozempic and Levemir obtained through [employer] assistance program.  Enrollment ends 08/07/2021  Patient's [facility] pharmacy is:  [facility] [phone] [place] [address] [address]: [phone]-[address]: [phone] Up:  Patient agrees to Care Plan and Follow-up.  Plan: Telephone follow up appointment with care management team member scheduled for:  2 to 3 months  Tammy Cherre RobinsmD Clinical Pharmacist [facility]

## 2021-06-26 NOTE — Patient Instructions (Signed)
Visit Information  PATIENT GOALS:  Goals Addressed             This Visit's Progress    Pharmacy Chronic Care Management Goals   On track    Current Barriers:  Medication assistance needed for brand name medications Unable to independently monitor therapeutic efficacy (glucometer needed)  Unable to achieve control of Type 2 diabetes   Pharmacist Clinical Goal(s):  Over the next 90 days, patient will verbalize ability to afford treatment regimen achieve adherence to monitoring guidelines and medication adherence to achieve therapeutic efficacy achieve control of type 2 diabetes as evidenced by A1c < 7.0% maintain control of cholesterol and hypertension as evidenced by maintaining goals list below  through collaboration with PharmD and provider.   Interventions: 1:1 collaboration with Zola Button, Grayling Congress, DO regarding development and update of comprehensive plan of care as evidenced by provider attestation and co-signature Inter-disciplinary care team collaboration (see longitudinal plan of care) Comprehensive medication review performed; medication list updated in electronic medical record  Diabetes: Uncontrolled; A1c goal < 7.0 Last A1c was 8.5% (05/28/2021) - improved compared to A1c from 7 months ago which was 9.4% Current treatment: Ozempic 1mg  injected subcutaneously weekly on Sunday Levemir insulin 20 units injected each morning and 45 units injected each evening  Interventions:  Reviewed A1c goal Reviewed home blood glucose goals  Fasting blood glucose goal (before meals) = 80 to 130 Blood glucose goal after a meal = less than 180  Complete application for 2023 for Novo Nordisk patient assistance Continue to exercise at least 150 minutest per week Reminded to get yearly diabetic eye exam  Hypertension: Uncontrolled per office blood pressure readings. Blood pressure goal <140/90  BP Readings from Last 3 Encounters:  05/28/21 140/82  10/30/20 (!) 154/76  04/06/20  (!) 190/77  Home blood pressure readings: 120 to 130's / 60 to 79 Current treatment: Enalapril 10mg  twice a day Metoprolol ER 50mg  daily  Amlodipine 69m daily Furosemide 20mg  daily (also for swelling / fluid retention)  Interventions:  Updated medication list and added metoprolol ER back Continue current medications to lower blood pressure.  Continue to check blood pressure 2 to 3 times per week and record for future appointments Remember blood pressure control is important to prevent heart and kidney disease  Hyperlipidemia: LDL at goal but triglycerides a little above goal LDL goal <100 and Triglycerides <150 Lipid Panel     Component Value Date/Time   CHOL 170 05/28/2021 1102   TRIG 180.0 (H) 05/28/2021 1102   HDL 50.50 05/28/2021 1102   CHOLHDL 3 05/28/2021 1102   VLDL 36.0 05/28/2021 1102   LDLCALC 83 05/28/2021 1102   LDLCALC 119 (H) 10/08/2018 1620   LDLDIRECT 52.0 10/30/2020 1132  Current treatment: Atorvastatin 80mg  daily  Omega 3 Fatty Acids / Fish Oil - 1000mg  daily (helps to lower triglycerides)  Interventions:  Discussed LDL and Triglyceride goals Continue current medication to lower cholesterol Recommended increase frequency of exercise to goal of at least 150 minutest per week  Health Maintenance:  Reviewed vaccine and health maintenance records Interventions:  Recommended annual flu vaccine - patient has plans to get in October Recommended updated bivalent COVID 19 booster - patient has plan to get in October Recommended Shingrix vaccine series - will defer to 2023 Reminded to get annual diabetic eye exam.   Patient Goals/Self-Care Activities Over the next 90 days, patient will:  take medications as prescribed,  check glucose daily, document, and provide at future appointments,  check blood pressure 2 to 3 times per week, document, and provide at future appointments, and  Target a minimum of 150 minutes of moderate intensity exercise weekly  Follow Up  Plan: Telephone follow up appointment with care management team member scheduled for:  2 to 3 months        The patient verbalized understanding of instructions, educational materials, and care plan provided today and declined offer to receive copy of patient instructions, educational materials, and care plan.   Telephone follow up appointment with care management team member scheduled for: 2 to 3 months  Henrene Pastor, PharmD Clinical Pharmacist [facility]

## 2021-07-16 ENCOUNTER — Telehealth: Payer: Self-pay | Admitting: Family Medicine

## 2021-07-16 NOTE — Telephone Encounter (Signed)
Patient dropped off form for [place] to fill out for you  You can call patient when ready

## 2021-07-18 NOTE — Telephone Encounter (Signed)
Received

## 2021-07-19 ENCOUNTER — Other Ambulatory Visit: Payer: Self-pay

## 2021-07-19 MED ORDER — METOPROLOL SUCCINATE ER 50 MG PO TB24: 50.0000 mg | ORAL_TABLET | Freq: Every day | ORAL | 1 refills | Status: DC

## 2021-07-23 NOTE — Telephone Encounter (Signed)
Forms completed and signed and faxed. Sent to scan

## 2021-07-25 ENCOUNTER — Telehealth: Payer: Self-pay | Admitting: Family Medicine

## 2021-07-25 DIAGNOSIS — E1165 Type 2 diabetes mellitus with hyperglycemia: Secondary | ICD-10-CM

## 2021-07-25 MED ORDER — BD PEN NEEDLE MINI U/F 31G X 5 MM MISC: 1 refills | Status: DC

## 2021-07-25 NOTE — Telephone Encounter (Signed)
Patient states she uses 2 pin needles a day for insulin and the insurance wont cover it unless it says 2 pin needles a day in the instructions. Please advice.     Medication: Insulin Pen Needle (B-D UF III MINI PEN NEEDLES) 31G X 5 MM MISC   Has the patient contacted their pharmacy? Yes.   (If no, request that the patient contact the pharmacy for the refill.) (If yes, when and what did the pharmacy advise?)  Preferred Pharmacy (with phone number or street name): [facility] [id number] [place], [place] - [address]  [address] [place], [place] [address]  Phone:  [phone]  Fax:  [phone]   Agent: Please be advised that RX refills may take up to 3 business days. We ask that you follow-up with your pharmacy.

## 2021-07-25 NOTE — Telephone Encounter (Signed)
Correction sent

## 2021-08-25 ENCOUNTER — Other Ambulatory Visit: Payer: Self-pay | Admitting: Family Medicine

## 2021-09-11 ENCOUNTER — Other Ambulatory Visit: Payer: Medicare HMO

## 2021-10-10 ENCOUNTER — Other Ambulatory Visit: Payer: Self-pay | Admitting: Family Medicine

## 2021-10-10 DIAGNOSIS — I10 Essential (primary) hypertension: Secondary | ICD-10-CM

## 2021-10-10 DIAGNOSIS — E1169 Type 2 diabetes mellitus with other specified complication: Secondary | ICD-10-CM

## 2021-10-15 ENCOUNTER — Other Ambulatory Visit: Payer: Self-pay | Admitting: Family Medicine

## 2021-11-01 ENCOUNTER — Telehealth: Payer: Self-pay | Admitting: *Deleted

## 2021-11-01 NOTE — Telephone Encounter (Signed)
Called to let patient know we got Levemir in but she asked about her Ozempic and Pen Needles.  I advised that the order may be broken up but was unsure.  I will try and call today to see if it was sent out.

## 2021-11-06 ENCOUNTER — Other Ambulatory Visit: Payer: Self-pay | Admitting: *Deleted

## 2021-11-06 MED ORDER — OZEMPIC (1 MG/DOSE) 2 MG/1.5ML ~~LOC~~ SOPN: 1.0000 mg | PEN_INJECTOR | SUBCUTANEOUS | 0 refills | Status: DC

## 2021-11-06 MED ORDER — NOVOFINE PLUS PEN NEEDLE 32G X 4 MM MISC: 0 refills | Status: DC

## 2021-11-06 NOTE — Telephone Encounter (Signed)
Pt returned phone call. Please advise.  

## 2021-11-06 NOTE — Telephone Encounter (Signed)
Spoke with patient and advised her that I did speak with [employer].  Her order was placed on 09/16/21 and stated that her pen needles and Ozempic was in process.  Suppose to take about 6 weeks but we are past that.  They sent vouchers for both and rxs printed and patient will pick up both when she comes and pick up her Levemir on Friday.  Vouchers and rxs place at front for pick up.  ?

## 2021-11-06 NOTE — Telephone Encounter (Signed)
Left message with husband to call back.

## 2021-12-03 ENCOUNTER — Telehealth: Payer: Self-pay | Admitting: *Deleted

## 2021-12-03 NOTE — Telephone Encounter (Signed)
Received refill request from [facility] for [facility].  Left message on that this may be just a automatic refill and to call us if she needs the refill. ?

## 2021-12-04 NOTE — Telephone Encounter (Signed)
Spoke with [employer] today and they stated that patient pens and ozempic are still in processed and was processed on 12/01/21 and to allow 6 weeks to be sent to office (week of 12/16/21).  Patient can call for another voucher on 12/06/21.   ? ?Left message on machine for patient to call back. ?

## 2021-12-04 NOTE — Telephone Encounter (Signed)
Spoke with patient about update.  Advised that we will call for voucher on Friday 3/31. ?

## 2021-12-09 ENCOUNTER — Other Ambulatory Visit: Payer: Self-pay | Admitting: *Deleted

## 2021-12-09 MED ORDER — NOVOFINE PLUS PEN NEEDLE 32G X 4 MM MISC: 0 refills | Status: DC

## 2021-12-09 MED ORDER — OZEMPIC (1 MG/DOSE) 2 MG/1.5ML ~~LOC~~ SOPN: 1.0000 mg | PEN_INJECTOR | SUBCUTANEOUS | 0 refills | Status: DC

## 2021-12-09 NOTE — Telephone Encounter (Signed)
Spoke with patient and advised that rxs and vouchers were faxed over to [employer]. ?

## 2021-12-09 NOTE — Telephone Encounter (Addendum)
Spoke with Jane Young at [facility] for voucher.  Received voucher.  Rx and vouchers sent to pharmacy. ? ?Left message with pt husband to call back. ?

## 2021-12-10 NOTE — Telephone Encounter (Signed)
Patient states the pharmacy is telling her that a new waiver needs to be sent for the medication. They told her that the last waiver she used was a month ago and they expire every 30 days. Please advice.  ?

## 2021-12-10 NOTE — Telephone Encounter (Signed)
Call and spoke with Jane Young and gave him the voucher info that was faxed yesterday.  He stated that it did go through.  Patient notified that pharmacy was called and they are getting rx ready. ?

## 2021-12-12 ENCOUNTER — Encounter: Payer: Self-pay | Admitting: Family Medicine

## 2021-12-12 ENCOUNTER — Ambulatory Visit (INDEPENDENT_AMBULATORY_CARE_PROVIDER_SITE_OTHER): Payer: Medicare HMO | Admitting: Family Medicine

## 2021-12-12 VITALS — BP 138/90 | HR 87 | Temp 98.5°F | Resp 18 | Ht 63.0 in | Wt 222.6 lb

## 2021-12-12 DIAGNOSIS — E1169 Type 2 diabetes mellitus with other specified complication: Secondary | ICD-10-CM

## 2021-12-12 DIAGNOSIS — E1165 Type 2 diabetes mellitus with hyperglycemia: Secondary | ICD-10-CM | POA: Diagnosis not present

## 2021-12-12 DIAGNOSIS — I10 Essential (primary) hypertension: Secondary | ICD-10-CM | POA: Diagnosis not present

## 2021-12-12 DIAGNOSIS — E785 Hyperlipidemia, unspecified: Secondary | ICD-10-CM | POA: Diagnosis not present

## 2021-12-12 LAB — COMPREHENSIVE METABOLIC PANEL
ALT: 27 U/L (ref 0–35)
AST: 26 U/L (ref 0–37)
Albumin: 4.8 g/dL (ref 3.5–5.2)
Alkaline Phosphatase: 64 U/L (ref 39–117)
BUN: 17 mg/dL (ref 6–23)
CO2: 30 mEq/L (ref 19–32)
Calcium: 11.2 mg/dL — ABNORMAL HIGH (ref 8.4–10.5)
Chloride: 99 mEq/L (ref 96–112)
Creatinine, Ser: 0.92 mg/dL (ref 0.40–1.20)
GFR: 63.78 mL/min (ref >60.00)
Glucose, Bld: 78 mg/dL (ref 70–99)
Potassium: 4 mEq/L (ref 3.5–5.1)
Sodium: 140 mEq/L (ref 135–145)
Total Bilirubin: 0.7 mg/dL (ref 0.2–1.2)
Total Protein: 7.2 g/dL (ref 6.0–8.3)

## 2021-12-12 LAB — LIPID PANEL
Cholesterol: 162 mg/dL (ref 0–200)
HDL: 50.6 mg/dL (ref >39.00)
LDL Cholesterol: 79 mg/dL (ref 0–99)
NonHDL: 111.35
Total CHOL/HDL Ratio: 3
Triglycerides: 161 mg/dL — ABNORMAL HIGH (ref 0.0–149.0)
VLDL: 32.2 mg/dL (ref 0.0–40.0)

## 2021-12-12 LAB — HEMOGLOBIN A1C: Hgb A1c MFr Bld: 7.5 % — ABNORMAL HIGH (ref 4.6–6.5)

## 2021-12-12 LAB — MICROALBUMIN / CREATININE URINE RATIO
Creatinine,U: 15 mg/dL
Microalb Creat Ratio: 4.7 mg/g (ref 0.0–30.0)
Microalb, Ur: <0.7 mg/dL (ref 0.0–1.9)

## 2021-12-12 NOTE — Assessment & Plan Note (Signed)
Well controlled, no changes to meds. Encouraged heart healthy diet such as the DASH diet and exercise as tolerated.  °

## 2021-12-12 NOTE — Progress Notes (Addendum)
? ?Subjective:  ? ?By signing my name below, I, Jane Young, attest that this documentation has been prepared under the direction and in the presence of Ann Held, DO. 12/12/2021 ? ? ? Patient ID: Jane Young, female    DOB: Jun 21, 1951, 71 y.o.   MRN: [id number] ? ?Chief Complaint  ?Patient presents with  ? Diabetes  ? Hypertension  ? Hyperlipidemia  ? Follow-up  ? ? ?Diabetes ?Pertinent negatives for hypoglycemia include no headaches. Pertinent negatives for diabetes include no blurred vision and no chest pain.  ?Hypertension ?Pertinent negatives include no blurred vision, chest pain, headaches, malaise/fatigue, palpitations or shortness of breath.  ?Hyperlipidemia ?Pertinent negatives include no chest pain or shortness of breath.  ?Patient is in today for a follow up visit.  ? ?She has a libre device to check her blood sugar and reports doing well while on it. She continues taking ozempic injections, 1000 mg metformin 2x daily PO and reports no new issues while taking it.  ?Lab Results  ?Component Value Date  ? HGBA1C 7.5 (H) 12/12/2021  ? ?She reports having an eye floater that is stuck in her line of vision since December, 2022. She notes it has black lines and a black dot obscuring her vision. She finds it has gotten clearer since she developed it. She also recently developed another eye floater in her left eye. She has seen an eye doctor last year and is planning on bringing this issue to them during her next visit.  ?She is due for a mammogram and is willing to schedule and appointment in the fall season. She has not completed a mammogram in the past 20 years.  ?She is due for a colonoscopy. She has never completed one. She wants to wait until after her husbands surgery  ? ? ?Past Medical History:  ?Diagnosis Date  ? Diabetes mellitus   ? Type 2  ? Heart murmur   ? Hyperlipidemia   ? Hypertension   ? ? ?Past Surgical History:  ?Procedure Laterality Date  ? APPENDECTOMY    ? [place]  ? OVARIAN CYST REMOVAL    ? ? ?Family History  ?Problem Relation Age of Onset  ? Hyperlipidemia Mother   ? Hypertension Mother   ? Cancer Father   ?     kidney  ? Cancer Maternal Grandmother   ? Diabetes Paternal Grandmother   ? Heart disease Paternal Grandmother   ? ? ?Social History  ? ?Socioeconomic History  ? Marital status: Married  ?  Spouse name: Not on file  ? Number of children: Not on file  ? Years of education: Not on file  ? Highest education level: Not on file  ?Occupational History  ? Not on file  ?Tobacco Use  ? Smoking status: Never  ? Smokeless tobacco: Never  ?Substance and Sexual Activity  ? Alcohol use: Yes  ? Drug use: No  ? Sexual activity: Not on file  ?Other Topics Concern  ? Not on file  ?Social History Narrative  ? Not on file  ? ?Social Determinants of Health  ? ?Financial Resource Strain: Medium Risk  ? Difficulty of Paying Living Expenses: Somewhat hard  ?Food Insecurity: Not on file  ?Transportation Needs: Not on file  ?Physical Activity: Insufficiently Active  ? Days of Exercise per Week: 2 days  ? Minutes of Exercise per Session: 40 min  ?Stress: Not on file  ?Social Connections: Not on file  ?Intimate Partner Violence: Not  on file  ? ? ?Outpatient Medications Prior to Visit  ?Medication Sig Dispense Refill  ? amLODipine (NORVASC) 10 MG tablet TAKE ONE TABLET BY MOUTH DAILY 90 tablet 1  ? aspirin EC 81 MG tablet Take 81 mg by mouth daily. Swallow whole.    ? atorvastatin (LIPITOR) 80 MG tablet TAKE ONE TABLET BY MOUTH DAILY 90 tablet 3  ? blood glucose meter kit and supplies KIT As directed qd 1 each 0  ? enalapril (VASOTEC) 10 MG tablet TAKE ONE TABLET BY MOUTH TWICE A DAY 180 tablet 1  ? furosemide (LASIX) 20 MG tablet TAKE ONE TABLET BY MOUTH DAILY **PLEASE SCHEDULE AN APPOINTMENT FOR ADDITIONAL REFILLS** 90 tablet 1  ? glucose blood (ONETOUCH VERIO) test strip USE ONE STRIP TO TEST DAILY 100 strip 12  ? Insulin Pen Needle (NOVOFINE PLUS PEN NEEDLE) 32G X 4 MM MISC Use twice  daily with Levemir 100 each 0  ? Lancets (ONETOUCH DELICA PLUS [id number]) MISC USE ONE LANCET TO TEST DAILY 100 each 12  ? LEVEMIR FLEXTOUCH 100 UNIT/ML Pen INJECT 65 UNITS UNDER THE SKIN DAILY (Patient taking differently: 20 units each morning and 45 units each evening) 15 mL 0  ? metFORMIN (GLUCOPHAGE) 1000 MG tablet TAKE ONE TABLET BY MOUTH TWICE A DAY WITH MEALS 180 tablet 2  ? metoprolol succinate (TOPROL-XL) 50 MG 24 hr tablet Take 1 tablet (50 mg total) by mouth daily. 90 tablet 1  ? Multiple Vitamin (MULTIVITAMIN) tablet Take 1 tablet by mouth daily.    ? Omega-3 Fatty Acids (FISH OIL) 1000 MG CAPS Take 1 capsule by mouth daily.    ? Semaglutide, 1 MG/DOSE, (OZEMPIC, 1 MG/DOSE,) 2 MG/1.5ML SOPN Inject 1 mg into the skin once a week. 1.5 mL 0  ? blood glucose meter kit and supplies KIT Dispense based on patient and insurance preference. Use up to four times daily as directed. (FOR ICD-9 250.00, 250.01). 1 each 0  ? ?No facility-administered medications prior to visit.  ? ? ?No Known Allergies ? ?Review of Systems  ?Constitutional:  Negative for fever and malaise/fatigue.  ?HENT:  Negative for congestion.   ?Eyes:  Negative for blurred vision.  ?Respiratory:  Negative for cough and shortness of breath.   ?Cardiovascular:  Negative for chest pain, palpitations and leg swelling.  ?Gastrointestinal:  Negative for vomiting.  ?Musculoskeletal:  Negative for back pain.  ?Skin:  Negative for rash.  ?Neurological:  Negative for loss of consciousness and headaches.  ? ?   ?Objective:  ?  ?Physical Exam ?Vitals and nursing note reviewed.  ?Constitutional:   ?   General: She is not in acute distress. ?   Appearance: Normal appearance. She is not ill-appearing.  ?HENT:  ?   Head: Normocephalic and atraumatic.  ?   Right Ear: External ear normal.  ?   Left Ear: External ear normal.  ?Eyes:  ?   Extraocular Movements: Extraocular movements intact.  ?   Pupils: Pupils are equal, round, and reactive to light.   ?Cardiovascular:  ?   Rate and Rhythm: Normal rate and regular rhythm.  ?   Heart sounds: Normal heart sounds. No murmur heard. ?  No gallop.  ?Pulmonary:  ?   Effort: Pulmonary effort is normal. No respiratory distress.  ?   Breath sounds: Normal breath sounds. No wheezing or rales.  ?Musculoskeletal:  ?   Right lower leg: No edema.  ?   Left lower leg: No edema.  ?Skin: ?  General: Skin is warm and dry.  ?Neurological:  ?   Mental Status: She is alert and oriented to person, place, and time.  ?Psychiatric:     ?   Judgment: Judgment normal.  ? ? ?BP 138/90 (BP Location: Left Arm, Patient Position: Sitting, Cuff Size: Large)   Pulse 87   Temp 98.5 ?F (36.9 ?C) (Oral)   Resp 18   Ht _0  (1.6 m)   Wt 222 lb 9.6 oz (101 kg)   SpO2 98%   BMI 39.43 kg/m?  ?Wt Readings from Last 3 Encounters:  ?12/12/21 222 lb 9.6 oz (101 kg)  ?05/28/21 228 lb 12.8 oz (103.8 kg)  ?10/30/20 233 lb 12.8 oz (106.1 kg)  ? ? ?Diabetic Foot Exam - Simple   ?Simple Foot Form ?Diabetic Foot exam was performed with the following findings: Yes 12/12/2021 11:34 AM  ?Visual Inspection ?No deformities, no ulcerations, no other skin breakdown bilaterally: Yes ?Sensation Testing ?Intact to touch and monofilament testing bilaterally: Yes ?Pulse Check ?Posterior Tibialis and Dorsalis pulse intact bilaterally: Yes ?Comments ?  ? ?Lab Results  ?Component Value Date  ? WBC 6.1 01/15/2016  ? HGB 14.5 01/15/2016  ? HCT 43.9 01/15/2016  ? PLT 234.0 01/15/2016  ? GLUCOSE 78 12/12/2021  ? CHOL 162 12/12/2021  ? TRIG 161.0 (H) 12/12/2021  ? HDL 50.60 12/12/2021  ? LDLDIRECT 52.0 10/30/2020  ? [place] 79 12/12/2021  ? ALT 27 12/12/2021  ? AST 26 12/12/2021  ? NA 140 12/12/2021  ? K 4.0 12/12/2021  ? CL 99 12/12/2021  ? CREATININE 0.92 12/12/2021  ? BUN 17 12/12/2021  ? CO2 30 12/12/2021  ? TSH 2.19 10/04/2009  ? HGBA1C 7.5 (H) 12/12/2021  ? MICROALBUR <0.7 12/12/2021  ? ? ?Lab Results  ?Component Value Date  ? TSH 2.19 10/04/2009  ? ?Lab Results   ?Component Value Date  ? WBC 6.1 01/15/2016  ? HGB 14.5 01/15/2016  ? HCT 43.9 01/15/2016  ? MCV 85.1 01/15/2016  ? PLT 234.0 01/15/2016  ? ?Lab Results  ?Component Value Date  ? NA 140 12/12/2021  ? K 4.0 12/12/2021  ? CO2 30 04/06/

## 2021-12-12 NOTE — Patient Instructions (Signed)

## 2021-12-12 NOTE — Assessment & Plan Note (Signed)
Encourage heart healthy diet such as MIND or DASH diet, increase exercise, avoid trans fats, simple carbohydrates and processed foods, consider a krill or fish or flaxseed oil cap daily.  °

## 2021-12-12 NOTE — Assessment & Plan Note (Signed)
hgba1c to be checked, minimize simple carbs. Increase exercise as tolerated. Continue current meds  

## 2021-12-19 ENCOUNTER — Encounter: Payer: Self-pay | Admitting: Family Medicine

## 2021-12-24 ENCOUNTER — Encounter: Payer: Self-pay | Admitting: *Deleted

## 2021-12-24 ENCOUNTER — Other Ambulatory Visit: Payer: Self-pay | Admitting: *Deleted

## 2021-12-24 MED ORDER — SEMAGLUTIDE (2 MG/DOSE) 8 MG/3ML ~~LOC~~ SOPN
2.0000 mg | PEN_INJECTOR | SUBCUTANEOUS | 0 refills | Status: DC
Start: 2021-12-24 — End: 2023-10-14

## 2021-12-31 DIAGNOSIS — H524 Presbyopia: Secondary | ICD-10-CM | POA: Diagnosis not present

## 2021-12-31 DIAGNOSIS — H43393 Other vitreous opacities, bilateral: Secondary | ICD-10-CM | POA: Diagnosis not present

## 2021-12-31 DIAGNOSIS — E1136 Type 2 diabetes mellitus with diabetic cataract: Secondary | ICD-10-CM | POA: Diagnosis not present

## 2021-12-31 DIAGNOSIS — Z7984 Long term (current) use of oral hypoglycemic drugs: Secondary | ICD-10-CM | POA: Diagnosis not present

## 2021-12-31 DIAGNOSIS — Z7985 Long-term (current) use of injectable non-insulin antidiabetic drugs: Secondary | ICD-10-CM | POA: Diagnosis not present

## 2021-12-31 DIAGNOSIS — H5213 Myopia, bilateral: Secondary | ICD-10-CM | POA: Diagnosis not present

## 2021-12-31 DIAGNOSIS — H52203 Unspecified astigmatism, bilateral: Secondary | ICD-10-CM | POA: Diagnosis not present

## 2021-12-31 DIAGNOSIS — Z794 Long term (current) use of insulin: Secondary | ICD-10-CM | POA: Diagnosis not present

## 2021-12-31 DIAGNOSIS — H25813 Combined forms of age-related cataract, bilateral: Secondary | ICD-10-CM | POA: Diagnosis not present

## 2021-12-31 LAB — HM DIABETES EYE EXAM

## 2022-01-02 ENCOUNTER — Encounter: Payer: Self-pay | Admitting: *Deleted

## 2022-01-02 ENCOUNTER — Encounter: Payer: Self-pay | Admitting: Family Medicine

## 2022-01-02 ENCOUNTER — Telehealth: Payer: Self-pay | Admitting: *Deleted

## 2022-01-02 NOTE — Progress Notes (Signed)
Diabetic Eye exam completed on 12/31/21 by Dr. Gershon Crane.  ?Negative for retinopathy.  ?

## 2022-01-02 NOTE — Telephone Encounter (Signed)
We received reorder form for [employer] for [employer].  Form faxed in. ?

## 2022-01-05 ENCOUNTER — Other Ambulatory Visit: Payer: Self-pay | Admitting: Family Medicine

## 2022-01-16 ENCOUNTER — Telehealth: Payer: Self-pay | Admitting: Family Medicine

## 2022-01-16 MED ORDER — METOPROLOL SUCCINATE ER 50 MG PO TB24: 50.0000 mg | ORAL_TABLET | Freq: Every day | ORAL | 1 refills | Status: DC

## 2022-01-16 NOTE — Telephone Encounter (Signed)
Medication: metoprolol succinate (TOPROL-XL) 50 MG 24 hr tablet  ?  ? ?Preferred Pharmacy (with phone number or street name):  ? ?[facility] [id number] ? ? ?

## 2022-01-16 NOTE — Telephone Encounter (Signed)
Refill sent.

## 2022-01-19 ENCOUNTER — Other Ambulatory Visit: Payer: Self-pay | Admitting: Family Medicine

## 2022-01-24 ENCOUNTER — Encounter: Payer: Self-pay | Admitting: *Deleted

## 2022-01-27 NOTE — Telephone Encounter (Signed)
Levemir picked up by patient.

## 2022-03-26 ENCOUNTER — Telehealth: Payer: Self-pay | Admitting: *Deleted

## 2022-03-26 NOTE — Telephone Encounter (Signed)
Received letter from [employer] that medication should be auto filled on 04/13/22 and we should receive in 10-14 business days (around 04/25/22-05/01/22)

## 2022-04-02 ENCOUNTER — Telehealth: Payer: Self-pay

## 2022-04-02 NOTE — Telephone Encounter (Signed)
VM left for patient regarding NovoFine needles. They have arrived and have been placed at the front for pick up.

## 2022-04-04 NOTE — Telephone Encounter (Signed)
Patient wants to leave needles until the Levemir comes in.

## 2022-04-11 ENCOUNTER — Other Ambulatory Visit: Payer: Self-pay | Admitting: Family Medicine

## 2022-04-11 DIAGNOSIS — I10 Essential (primary) hypertension: Secondary | ICD-10-CM

## 2022-04-28 ENCOUNTER — Telehealth: Payer: Self-pay

## 2022-04-28 NOTE — Telephone Encounter (Signed)
LVM on home phone advising that Ozempic was ready for pick up

## 2022-05-05 NOTE — Telephone Encounter (Signed)
Levemir arrived. Pt called and LVM. Rx placed in the frig.

## 2022-06-23 ENCOUNTER — Telehealth: Payer: Self-pay | Admitting: Licensed Clinical Social Worker

## 2022-06-23 NOTE — Patient Outreach (Signed)
  Care Coordination   06/23/2022 Name: Briani Maul MRN: [id number] DOB: Aug 05, 1951   Care Coordination Outreach Attempts:  An unsuccessful telephone outreach was attempted today to offer the patient information about available care coordination services as a benefit of their health plan.   Follow Up Plan:  Additional outreach attempts will be made to offer the patient care coordination information and services.   Encounter Outcome:  No Answer  Care Coordination Interventions Activated:  No   Care Coordination Interventions:  No, not indicated    Casimer Lanius, [place] [phone]

## 2022-06-26 ENCOUNTER — Encounter: Payer: Self-pay | Admitting: Family Medicine

## 2022-06-26 ENCOUNTER — Ambulatory Visit (INDEPENDENT_AMBULATORY_CARE_PROVIDER_SITE_OTHER): Payer: Medicare HMO | Admitting: Family Medicine

## 2022-06-26 VITALS — BP 142/80 | HR 82 | Temp 98.8°F | Resp 12 | Ht 63.0 in | Wt 220.0 lb

## 2022-06-26 DIAGNOSIS — E785 Hyperlipidemia, unspecified: Secondary | ICD-10-CM | POA: Diagnosis not present

## 2022-06-26 DIAGNOSIS — E1169 Type 2 diabetes mellitus with other specified complication: Secondary | ICD-10-CM

## 2022-06-26 DIAGNOSIS — I10 Essential (primary) hypertension: Secondary | ICD-10-CM | POA: Diagnosis not present

## 2022-06-26 DIAGNOSIS — Z23 Encounter for immunization: Secondary | ICD-10-CM | POA: Diagnosis not present

## 2022-06-26 DIAGNOSIS — E1165 Type 2 diabetes mellitus with hyperglycemia: Secondary | ICD-10-CM | POA: Diagnosis not present

## 2022-06-26 DIAGNOSIS — J111 Influenza due to unidentified influenza virus with other respiratory manifestations: Secondary | ICD-10-CM

## 2022-06-26 LAB — CBC
HCT: 40.7 % (ref 36.0–46.0)
Hemoglobin: 13.4 g/dL (ref 12.0–15.0)
MCHC: 32.9 g/dL (ref 30.0–36.0)
MCV: 84.5 fl (ref 78.0–100.0)
Platelets: 247 10*3/uL (ref 150.0–400.0)
RBC: 4.81 Mil/uL (ref 3.87–5.11)
RDW: 13.4 % (ref 11.5–15.5)
WBC: 6.6 10*3/uL (ref 4.0–10.5)

## 2022-06-26 LAB — COMPREHENSIVE METABOLIC PANEL
ALT: 30 U/L (ref 0–35)
AST: 27 U/L (ref 0–37)
Albumin: 4.5 g/dL (ref 3.5–5.2)
Alkaline Phosphatase: 59 U/L (ref 39–117)
BUN: 18 mg/dL (ref 6–23)
CO2: 27 mEq/L (ref 19–32)
Calcium: 10.5 mg/dL (ref 8.4–10.5)
Chloride: 99 mEq/L (ref 96–112)
Creatinine, Ser: 0.92 mg/dL (ref 0.40–1.20)
GFR: 63.54 mL/min (ref >60.00)
Glucose, Bld: 112 mg/dL — ABNORMAL HIGH (ref 70–99)
Potassium: 3.9 mEq/L (ref 3.5–5.1)
Sodium: 138 mEq/L (ref 135–145)
Total Bilirubin: 0.8 mg/dL (ref 0.2–1.2)
Total Protein: 7 g/dL (ref 6.0–8.3)

## 2022-06-26 LAB — LIPID PANEL
Cholesterol: 156 mg/dL (ref 0–200)
HDL: 46.3 mg/dL (ref >39.00)
NonHDL: 109.38
Total CHOL/HDL Ratio: 3
Triglycerides: 206 mg/dL — ABNORMAL HIGH (ref 0.0–149.0)
VLDL: 41.2 mg/dL — ABNORMAL HIGH (ref 0.0–40.0)

## 2022-06-26 LAB — HEMOGLOBIN A1C: Hgb A1c MFr Bld: 7.7 % — ABNORMAL HIGH (ref 4.6–6.5)

## 2022-06-26 LAB — LDL CHOLESTEROL, DIRECT: Direct LDL: 83 mg/dL

## 2022-06-26 LAB — TSH: TSH: 3.04 u[IU]/mL (ref 0.35–5.50)

## 2022-06-26 MED ORDER — ATORVASTATIN CALCIUM 80 MG PO TABS: 80.0000 mg | ORAL_TABLET | Freq: Every day | ORAL | 3 refills | Status: DC

## 2022-06-26 MED ORDER — ENALAPRIL MALEATE 10 MG PO TABS
10.0000 mg | ORAL_TABLET | Freq: Two times a day (BID) | ORAL | 1 refills | Status: DC
Start: 2022-06-26 — End: 2022-12-15

## 2022-06-26 MED ORDER — FUROSEMIDE 20 MG PO TABS: ORAL_TABLET | ORAL | 0 refills | Status: DC

## 2022-06-26 MED ORDER — METOPROLOL SUCCINATE ER 50 MG PO TB24: 50.0000 mg | ORAL_TABLET | Freq: Every day | ORAL | 1 refills | Status: DC

## 2022-06-26 MED ORDER — METFORMIN HCL 1000 MG PO TABS: ORAL_TABLET | ORAL | 2 refills | Status: DC

## 2022-06-26 MED ORDER — AMLODIPINE BESYLATE 10 MG PO TABS: 10.0000 mg | ORAL_TABLET | Freq: Every day | ORAL | 0 refills | Status: DC

## 2022-06-26 NOTE — Progress Notes (Signed)
Subjective:   By signing my name below, I, Shehryar Baig, attest that this documentation has been prepared under the direction and in the presence of Ann Held, DO. 06/26/2022    Patient ID: Jane Young, female    DOB: 10-02-1950, 71 y.o.   MRN: [id number]  Chief Complaint  Patient presents with   medications    bloodwork    General follow up blood work     HPI Patient is in today for a office visit.   She is requesting a refill for 10 mg amlodipine daily PO, 10 mg enalapril 2x daily PO, 20 mg lasix daily PO, 50 mg metoprolol succinate daily PO, 80 mg atorvastatin daily PO, 1000 mg metformin 2x daily PO.  She reports no new issues while taking Ozempic. She has lost some weight while taking it. She currently weighs 220 lb's and has a BMI of 38.97 kg/m^2.  Wt Readings from Last 3 Encounters:  06/26/22 220 lb (99.8 kg)  12/12/21 222 lb 9.6 oz (101 kg)  05/28/21 228 lb 12.8 oz (103.8 kg)   She does not measure her blood sugars regularly at home. She continues taking 1000 mg metformin 2x daily PO and reports no new issues while taking it.  Lab Results  Component Value Date   HGBA1C 7.5 (H) 12/12/2021   She is willing to schedule and appointment for a mammogram and colonoscopy after the christmas holidays.  She is interested in receiving the flu vaccine during this visit. She is due for tetanus vaccines. She is eligible to receive the RSV vaccine. She is UTD on pneumonia vaccines.    Past Medical History:  Diagnosis Date   Diabetes mellitus    Type 2   Heart murmur    Hyperlipidemia    Hypertension     Past Surgical History:  Procedure Laterality Date   APPENDECTOMY     CESAREAN SECTION  1995   OVARIAN CYST REMOVAL      Family History  Problem Relation Age of Onset   Hyperlipidemia Mother    Hypertension Mother    Cancer Father        kidney   Cancer Maternal Grandmother    Diabetes Paternal Grandmother    Heart disease Paternal Grandmother      Social History   Socioeconomic History   Marital status: Married    Spouse name: Not on file   Number of children: Not on file   Years of education: Not on file   Highest education level: Not on file  Occupational History   Not on file  Tobacco Use   Smoking status: Never   Smokeless tobacco: Never  Substance and Sexual Activity   Alcohol use: Yes   Drug use: No   Sexual activity: Not on file  Other Topics Concern   Not on file  Social History Narrative   Not on file   Social Determinants of Health   Financial Resource Strain: Medium Risk (05/30/2021)   Overall Financial Resource Strain (CARDIA)    Difficulty of Paying Living Expenses: Somewhat hard  Food Insecurity: Not on file  Transportation Needs: Not on file  Physical Activity: Insufficiently Active (05/30/2021)   Exercise Vital Sign    Days of Exercise per Week: 2 days    Minutes of Exercise per Session: 40 min  Stress: Not on file  Social Connections: Not on file  Intimate Partner Violence: Not on file    Outpatient Medications Prior to Visit  Medication Sig  Dispense Refill   aspirin EC 81 MG tablet Take 81 mg by mouth daily. Swallow whole.     blood glucose meter kit and supplies KIT As directed qd 1 each 0   glucose blood (ONETOUCH VERIO) test strip USE ONE STRIP TO TEST DAILY 100 strip 12   Insulin Pen Needle (NOVOFINE PLUS PEN NEEDLE) 32G X 4 MM MISC Use twice daily with Levemir 100 each 0   Lancets (ONETOUCH DELICA PLUS [id number]) MISC USE ONE LANCET TO TEST DAILY 100 each 12   LEVEMIR FLEXTOUCH 100 UNIT/ML Pen INJECT 65 UNITS UNDER THE SKIN DAILY (Patient taking differently: 20 units each morning and 45 units each evening) 15 mL 0   Multiple Vitamin (MULTIVITAMIN) tablet Take 1 tablet by mouth daily.     Omega-3 Fatty Acids (FISH OIL) 1000 MG CAPS Take 1 capsule by mouth daily.     Semaglutide, 2 MG/DOSE, 8 MG/3ML SOPN Inject 2 mg as directed once a week. 3 mL 0   amLODipine (NORVASC) 10 MG tablet  TAKE ONE TABLET BY MOUTH DAILY 90 tablet 0   atorvastatin (LIPITOR) 80 MG tablet TAKE ONE TABLET BY MOUTH DAILY 90 tablet 3   enalapril (VASOTEC) 10 MG tablet TAKE ONE TABLET BY MOUTH TWICE A DAY 180 tablet 0   furosemide (LASIX) 20 MG tablet TAKE ONE TABLET BY MOUTH DAILY 90 tablet 0   metFORMIN (GLUCOPHAGE) 1000 MG tablet TAKE ONE TABLET BY MOUTH TWICE A DAY WITH MEALS 180 tablet 2   metoprolol succinate (TOPROL-XL) 50 MG 24 hr tablet Take 1 tablet (50 mg total) by mouth daily. 90 tablet 1   No facility-administered medications prior to visit.    No Known Allergies  Review of Systems  Constitutional:  Negative for fever and malaise/fatigue.  HENT:  Negative for congestion.   Eyes:  Negative for blurred vision.  Respiratory:  Negative for shortness of breath.   Cardiovascular:  Negative for chest pain, palpitations and leg swelling.  Gastrointestinal:  Negative for abdominal pain, blood in stool and nausea.  Genitourinary:  Negative for dysuria and frequency.  Musculoskeletal:  Negative for falls.  Skin:  Negative for rash.  Neurological:  Negative for dizziness, loss of consciousness and headaches.  Endo/Heme/Allergies:  Negative for environmental allergies.  Psychiatric/Behavioral:  Negative for depression. The patient is not nervous/anxious.        Objective:    Physical Exam Vitals and nursing note reviewed.  Constitutional:      General: She is not in acute distress.    Appearance: Normal appearance. She is not ill-appearing.  HENT:     Head: Normocephalic and atraumatic.     Right Ear: External ear normal.     Left Ear: External ear normal.  Eyes:     Extraocular Movements: Extraocular movements intact.     Pupils: Pupils are equal, round, and reactive to light.  Cardiovascular:     Rate and Rhythm: Normal rate and regular rhythm.     Heart sounds: Normal heart sounds. No murmur heard.    No gallop.  Pulmonary:     Effort: Pulmonary effort is normal. No  respiratory distress.     Breath sounds: Normal breath sounds. No wheezing or rales.  Skin:    General: Skin is warm and dry.  Neurological:     Mental Status: She is alert and oriented to person, place, and time.  Psychiatric:        Judgment: Judgment normal.     BP Marland Kitchen)  142/80 (BP Location: Left Arm, Cuff Size: Large)   Pulse 82   Temp 98.8 F (37.1 C) (Oral)   Resp 12   Ht 5' 3" (1.6 m)   Wt 220 lb (99.8 kg)   SpO2 98%   BMI 38.97 kg/m  Wt Readings from Last 3 Encounters:  06/26/22 220 lb (99.8 kg)  12/12/21 222 lb 9.6 oz (101 kg)  05/28/21 228 lb 12.8 oz (103.8 kg)    Diabetic Foot Exam - Simple   No data filed    Lab Results  Component Value Date   WBC 6.1 01/15/2016   HGB 14.5 01/15/2016   HCT 43.9 01/15/2016   PLT 234.0 01/15/2016   GLUCOSE 78 12/12/2021   CHOL 162 12/12/2021   TRIG 161.0 (H) 12/12/2021   HDL 50.60 12/12/2021   LDLDIRECT 52.0 10/30/2020   LDLCALC 79 12/12/2021   ALT 27 12/12/2021   AST 26 12/12/2021   NA 140 12/12/2021   K 4.0 12/12/2021   CL 99 12/12/2021   CREATININE 0.92 12/12/2021   BUN 17 12/12/2021   CO2 30 12/12/2021   TSH 2.19 10/04/2009   HGBA1C 7.5 (H) 12/12/2021   MICROALBUR <0.7 12/12/2021    Lab Results  Component Value Date   TSH 2.19 10/04/2009   Lab Results  Component Value Date   WBC 6.1 01/15/2016   HGB 14.5 01/15/2016   HCT 43.9 01/15/2016   MCV 85.1 01/15/2016   PLT 234.0 01/15/2016   Lab Results  Component Value Date   NA 140 12/12/2021   K 4.0 12/12/2021   CO2 30 12/12/2021   GLUCOSE 78 12/12/2021   BUN 17 12/12/2021   CREATININE 0.92 12/12/2021   BILITOT 0.7 12/12/2021   ALKPHOS 64 12/12/2021   AST 26 12/12/2021   ALT 27 12/12/2021   PROT 7.2 12/12/2021   ALBUMIN 4.8 12/12/2021   CALCIUM 11.2 (H) 12/12/2021   GFR 63.78 12/12/2021   Lab Results  Component Value Date   CHOL 162 12/12/2021   Lab Results  Component Value Date   HDL 50.60 12/12/2021   Lab Results  Component Value  Date   LDLCALC 79 12/12/2021   Lab Results  Component Value Date   TRIG 161.0 (H) 12/12/2021   Lab Results  Component Value Date   CHOLHDL 3 12/12/2021   Lab Results  Component Value Date   HGBA1C 7.5 (H) 12/12/2021       Assessment & Plan:   Problem List Items Addressed This Visit       Unprioritized   Type 2 diabetes mellitus with hyperglycemia, without long-term current use of insulin ([place]) - Primary    hgba1c to be checked , minimize simple carbs. Increase exercise as tolerated. Continue current meds       Relevant Medications   atorvastatin (LIPITOR) 80 MG tablet   enalapril (VASOTEC) 10 MG tablet   metFORMIN (GLUCOPHAGE) 1000 MG tablet   Other Relevant Orders   Hemoglobin A1c   CBC   Comprehensive metabolic panel   TSH   Hyperlipidemia LDL goal <100    Encourage heart healthy diet such as MIND or DASH diet, increase exercise, avoid trans fats, simple carbohydrates and processed foods, consider a krill or fish or flaxseed oil cap daily.       Relevant Medications   amLODipine (NORVASC) 10 MG tablet   atorvastatin (LIPITOR) 80 MG tablet   enalapril (VASOTEC) 10 MG tablet   furosemide (LASIX) 20 MG tablet   metoprolol succinate (TOPROL-XL)  50 MG 24 hr tablet   Essential hypertension    Well controlled, no changes to meds. Encouraged heart healthy diet such as the DASH diet and exercise as tolerated.       Relevant Medications   amLODipine (NORVASC) 10 MG tablet   atorvastatin (LIPITOR) 80 MG tablet   enalapril (VASOTEC) 10 MG tablet   furosemide (LASIX) 20 MG tablet   metoprolol succinate (TOPROL-XL) 50 MG 24 hr tablet   Other Visit Diagnoses     Primary hypertension       Relevant Medications   amLODipine (NORVASC) 10 MG tablet   atorvastatin (LIPITOR) 80 MG tablet   enalapril (VASOTEC) 10 MG tablet   furosemide (LASIX) 20 MG tablet   metoprolol succinate (TOPROL-XL) 50 MG 24 hr tablet   Other Relevant Orders   Hemoglobin A1c   CBC    Comprehensive metabolic panel   Lipid panel   TSH   Hyperlipidemia associated with type 2 diabetes mellitus (HCC)       Relevant Medications   amLODipine (NORVASC) 10 MG tablet   atorvastatin (LIPITOR) 80 MG tablet   enalapril (VASOTEC) 10 MG tablet   furosemide (LASIX) 20 MG tablet   metoprolol succinate (TOPROL-XL) 50 MG 24 hr tablet   metFORMIN (GLUCOPHAGE) 1000 MG tablet   Other Relevant Orders   Hemoglobin A1c   CBC   Comprehensive metabolic panel   Lipid panel   TSH   Influenza       Need for influenza vaccination       Relevant Orders   Flu Vaccine QUAD High Dose(Fluad) (Completed)        Meds ordered this encounter  Medications   amLODipine (NORVASC) 10 MG tablet    Sig: Take 1 tablet (10 mg total) by mouth daily.    Dispense:  90 tablet    Refill:  0   atorvastatin (LIPITOR) 80 MG tablet    Sig: Take 1 tablet (80 mg total) by mouth daily.    Dispense:  90 tablet    Refill:  3   enalapril (VASOTEC) 10 MG tablet    Sig: Take 1 tablet (10 mg total) by mouth 2 (two) times daily.    Dispense:  180 tablet    Refill:  1   furosemide (LASIX) 20 MG tablet    Sig: TAKE ONE TABLET BY MOUTH DAILY    Dispense:  90 tablet    Refill:  0   metoprolol succinate (TOPROL-XL) 50 MG 24 hr tablet    Sig: Take 1 tablet (50 mg total) by mouth daily.    Dispense:  90 tablet    Refill:  1   metFORMIN (GLUCOPHAGE) 1000 MG tablet    Sig: TAKE ONE TABLET BY MOUTH TWICE A DAY WITH MEALS    Dispense:  180 tablet    Refill:  2    I, Ann Held, DO, personally preformed the services described in this documentation.  All medical record entries made by the scribe were at my direction and in my presence.  I have reviewed the chart and discharge instructions (if applicable) and agree that the record reflects my personal performance and is accurate and complete. 06/26/2022   I,Shehryar Baig,acting as a scribe for Ann Held, DO.,have documented all relevant  documentation on the behalf of Ann Held, DO,as directed by  Ann Held, DO while in the presence of Ann Held, DO.   Rosalita Chessman  Chase, DO

## 2022-06-26 NOTE — Assessment & Plan Note (Signed)
Well controlled, no changes to meds. Encouraged heart healthy diet such as the DASH diet and exercise as tolerated.  °

## 2022-06-26 NOTE — Assessment & Plan Note (Signed)
hgba1c to be checked, minimize simple carbs. Increase exercise as tolerated. Continue current meds  

## 2022-06-26 NOTE — Assessment & Plan Note (Signed)
Encourage heart healthy diet such as MIND or DASH diet, increase exercise, avoid trans fats, simple carbohydrates and processed foods, consider a krill or fish or flaxseed oil cap daily.  °

## 2022-06-26 NOTE — Patient Instructions (Signed)

## 2022-07-16 ENCOUNTER — Telehealth: Payer: Self-pay | Admitting: Family Medicine

## 2022-07-16 NOTE — Telephone Encounter (Signed)
Patient called to ask why Levemir was not on the current patient assistance application.  In looking into this it looks like it is about to be discontinued starting next year.  Not sure if you have heard this or not.  But if this is happening she would like to see if she can be switched to something else that is made by [employer] so she can get it through the patient assistance program.  I have a copy of the application on my desk if you are able to please review and see if it can be changed to something else.

## 2022-07-16 NOTE — Telephone Encounter (Signed)
Patient would like a call back from [place] regarding the patient assistance program for the [facility]. She would like a call back to her cell whenever possible.

## 2022-07-18 NOTE — Telephone Encounter (Signed)
Patient will be bringing in application soon and we just need to change Levemir to [place] once she brings in.

## 2022-07-24 ENCOUNTER — Telehealth: Payer: Self-pay | Admitting: Family Medicine

## 2022-07-24 NOTE — Telephone Encounter (Signed)
Pt dropped off document to be filled out by provider [employer] Assistance Program 8-pages) Pt states when document is done to fax document . Fax # [phone]. Document put at front office tray under providers name.

## 2022-07-25 NOTE — Telephone Encounter (Signed)
Received

## 2022-08-05 ENCOUNTER — Telehealth: Payer: Self-pay

## 2022-08-05 NOTE — Telephone Encounter (Signed)
Pt called. LVM advising that medication, Ozempic, has been delivered and is ready for pick up.

## 2022-08-06 MED ORDER — TRESIBA FLEXTOUCH 100 UNIT/ML ~~LOC~~ SOPN: 20.0000 [IU] | PEN_INJECTOR | Freq: Every day | SUBCUTANEOUS | 0 refills | Status: DC

## 2022-08-06 NOTE — Addendum Note (Signed)
Addended by: Thelma Barge D on: 08/06/2022 12:01 PM   Modules accepted: Orders

## 2022-09-24 ENCOUNTER — Telehealth: Payer: Self-pay

## 2022-09-24 ENCOUNTER — Other Ambulatory Visit: Payer: Self-pay | Admitting: Family Medicine

## 2022-09-24 DIAGNOSIS — I10 Essential (primary) hypertension: Secondary | ICD-10-CM

## 2022-09-24 NOTE — Telephone Encounter (Signed)
Pt called. LVM advising that medication has arrived. Medication placed in the back frig

## 2022-11-05 NOTE — Telephone Encounter (Signed)
Pt called. LVM advising that her medication has arrived. Placed in back frig.

## 2022-12-15 ENCOUNTER — Ambulatory Visit (INDEPENDENT_AMBULATORY_CARE_PROVIDER_SITE_OTHER): Payer: Medicare HMO | Admitting: Family Medicine

## 2022-12-15 ENCOUNTER — Encounter: Payer: Self-pay | Admitting: Family Medicine

## 2022-12-15 VITALS — BP 140/78 | HR 84 | Temp 97.8°F | Resp 18 | Ht 63.0 in | Wt 216.4 lb

## 2022-12-15 DIAGNOSIS — K649 Unspecified hemorrhoids: Secondary | ICD-10-CM | POA: Diagnosis not present

## 2022-12-15 DIAGNOSIS — I10 Essential (primary) hypertension: Secondary | ICD-10-CM | POA: Diagnosis not present

## 2022-12-15 DIAGNOSIS — E1169 Type 2 diabetes mellitus with other specified complication: Secondary | ICD-10-CM

## 2022-12-15 DIAGNOSIS — E785 Hyperlipidemia, unspecified: Secondary | ICD-10-CM | POA: Diagnosis not present

## 2022-12-15 DIAGNOSIS — E1165 Type 2 diabetes mellitus with hyperglycemia: Secondary | ICD-10-CM | POA: Diagnosis not present

## 2022-12-15 LAB — COMPREHENSIVE METABOLIC PANEL
ALT: 24 U/L (ref 0–35)
AST: 21 U/L (ref 0–37)
Albumin: 4.6 g/dL (ref 3.5–5.2)
Alkaline Phosphatase: 67 U/L (ref 39–117)
BUN: 21 mg/dL (ref 6–23)
CO2: 28 mEq/L (ref 19–32)
Calcium: 10.9 mg/dL — ABNORMAL HIGH (ref 8.4–10.5)
Chloride: 100 mEq/L (ref 96–112)
Creatinine, Ser: 0.97 mg/dL (ref 0.40–1.20)
GFR: 59.43 mL/min — ABNORMAL LOW (ref >60.00)
Glucose, Bld: 93 mg/dL (ref 70–99)
Potassium: 3.8 mEq/L (ref 3.5–5.1)
Sodium: 139 mEq/L (ref 135–145)
Total Bilirubin: 0.7 mg/dL (ref 0.2–1.2)
Total Protein: 7 g/dL (ref 6.0–8.3)

## 2022-12-15 LAB — CBC WITH DIFFERENTIAL/PLATELET
Basophils Absolute: 0 10*3/uL (ref 0.0–0.1)
Basophils Relative: 0.6 % (ref 0.0–3.0)
Eosinophils Absolute: 0.3 10*3/uL (ref 0.0–0.7)
Eosinophils Relative: 4.8 % (ref 0.0–5.0)
HCT: 41.6 % (ref 36.0–46.0)
Hemoglobin: 14.1 g/dL (ref 12.0–15.0)
Lymphocytes Relative: 43.6 % (ref 12.0–46.0)
Lymphs Abs: 2.8 10*3/uL (ref 0.7–4.0)
MCHC: 33.9 g/dL (ref 30.0–36.0)
MCV: 84.6 fl (ref 78.0–100.0)
Monocytes Absolute: 0.4 10*3/uL (ref 0.1–1.0)
Monocytes Relative: 6.3 % (ref 3.0–12.0)
Neutro Abs: 2.9 10*3/uL (ref 1.4–7.7)
Neutrophils Relative %: 44.7 % (ref 43.0–77.0)
Platelets: 236 10*3/uL (ref 150.0–400.0)
RBC: 4.91 Mil/uL (ref 3.87–5.11)
RDW: 14 % (ref 11.5–15.5)
WBC: 6.5 10*3/uL (ref 4.0–10.5)

## 2022-12-15 LAB — LIPID PANEL
Cholesterol: 173 mg/dL (ref 0–200)
HDL: 51.2 mg/dL (ref >39.00)
LDL Cholesterol: 88 mg/dL (ref 0–99)
NonHDL: 121.77
Total CHOL/HDL Ratio: 3
Triglycerides: 170 mg/dL — ABNORMAL HIGH (ref 0.0–149.0)
VLDL: 34 mg/dL (ref 0.0–40.0)

## 2022-12-15 LAB — MICROALBUMIN / CREATININE URINE RATIO
Creatinine,U: 18 mg/dL
Microalb Creat Ratio: 3.9 mg/g (ref 0.0–30.0)
Microalb, Ur: <0.7 mg/dL (ref 0.0–1.9)

## 2022-12-15 LAB — HEMOGLOBIN A1C: Hgb A1c MFr Bld: 6.9 % — ABNORMAL HIGH (ref 4.6–6.5)

## 2022-12-15 MED ORDER — FUROSEMIDE 20 MG PO TABS: ORAL_TABLET | ORAL | 0 refills | Status: DC

## 2022-12-15 MED ORDER — AMLODIPINE BESYLATE 10 MG PO TABS: 10.0000 mg | ORAL_TABLET | Freq: Every day | ORAL | 0 refills | Status: DC

## 2022-12-15 MED ORDER — METFORMIN HCL 1000 MG PO TABS: ORAL_TABLET | ORAL | 2 refills | Status: DC

## 2022-12-15 MED ORDER — ENALAPRIL MALEATE 10 MG PO TABS: 10.0000 mg | ORAL_TABLET | Freq: Two times a day (BID) | ORAL | 1 refills | Status: DC

## 2022-12-15 MED ORDER — METOPROLOL SUCCINATE ER 50 MG PO TB24: 50.0000 mg | ORAL_TABLET | Freq: Every day | ORAL | 1 refills | Status: DC

## 2022-12-15 NOTE — Patient Instructions (Signed)

## 2022-12-15 NOTE — Assessment & Plan Note (Signed)
Well controlled, no changes to meds. Encouraged heart healthy diet such as the DASH diet and exercise as tolerated.  °

## 2022-12-15 NOTE — Assessment & Plan Note (Signed)
hgba1c to be checked, minimize simple carbs. Increase exercise as tolerated. Continue current meds  

## 2022-12-15 NOTE — Progress Notes (Signed)
Subjective:   By signing my name below, I, [name], attest that this documentation has been prepared under the direction and in the presence of Donato SchultzYvonne R Lowne Chase, DO 12/15/22   Patient ID: Jane Young, female    DOB: 1951/03/08, 72 y.o.   MRN: [id number]  Chief Complaint  Patient presents with   Hypertension   Hyperlipidemia   Diabetes   Follow-up    HPI Patient is in today for an office visit.   She has not been monitoring her blood sugar at home due to not wanting to prick her finger. She tried using the [place] 2 last summer but has not been using it regularly. She received Ozempic and Guinea-Bissauresiba around the same time so she has not taken any Guinea-Bissauresiba.   Past Medical History:  Diagnosis Date   Diabetes mellitus    Type 2   Heart murmur    Hyperlipidemia    Hypertension     Past Surgical History:  Procedure Laterality Date   APPENDECTOMY     CESAREAN SECTION  1995   OVARIAN CYST REMOVAL      Family History  Problem Relation Age of Onset   Hyperlipidemia Mother    Hypertension Mother    Cancer Father        kidney   Cancer Maternal Grandmother    Diabetes Paternal Grandmother    Heart disease Paternal Grandmother     Social History   Socioeconomic History   Marital status: Married    Spouse name: Not on file   Number of children: Not on file   Years of education: Not on file   Highest education level: Associate degree: occupational, Scientist, product/process developmenttechnical, or vocational program  Occupational History   Not on file  Tobacco Use   Smoking status: Never   Smokeless tobacco: Never  Substance and Sexual Activity   Alcohol use: Yes   Drug use: No   Sexual activity: Not on file  Other Topics Concern   Not on file  Social History Narrative   Not on file   Social Determinants of Health   Financial Resource Strain: Low Risk  (12/08/2022)   Overall Financial Resource Strain (CARDIA)    Difficulty of Paying Living Expenses: Not hard at all  Food Insecurity: No Food  Insecurity (12/08/2022)   Hunger Vital Sign    Worried About Running Out of Food in the Last Year: Never true    Ran Out of Food in the Last Year: Never true  Transportation Needs: No Transportation Needs (12/08/2022)   PRAPARE - Administrator, Civil ServiceTransportation    Lack of Transportation (Medical): No    Lack of Transportation (Non-Medical): No  Physical Activity: Insufficiently Active (12/08/2022)   Exercise Vital Sign    Days of Exercise per Week: 2 days    Minutes of Exercise per Session: 30 min  Stress: No Stress Concern Present (12/08/2022)   [employer] - Occupational Stress Questionnaire    Feeling of Stress : Not at all  Social Connections: Moderately Integrated (12/08/2022)   Social Connection and Isolation Panel [NHANES]    Frequency of Communication with Friends and Family: More than three times a week    Frequency of Social Gatherings with Friends and Family: Three times a week    Attends Religious Services: 1 to 4 times per year    Active Member of Clubs or Organizations: No    Attends BankerClub or Organization Meetings: Not on file    Marital Status: Married  Intimate Partner Violence: Not on file    Outpatient Medications Prior to Visit  Medication Sig Dispense Refill   aspirin EC 81 MG tablet Take 81 mg by mouth daily. Swallow whole.     atorvastatin (LIPITOR) 80 MG tablet Take 1 tablet (80 mg total) by mouth daily. 90 tablet 3   blood glucose meter kit and supplies KIT As directed qd 1 each 0   glucose blood (ONETOUCH VERIO) test strip USE ONE STRIP TO TEST DAILY 100 strip 12   insulin degludec (TRESIBA FLEXTOUCH) 100 UNIT/ML FlexTouch Pen Inject 20 Units into the skin daily. 15 mL 0   Insulin Pen Needle (NOVOFINE PLUS PEN NEEDLE) 32G X 4 MM MISC Use twice daily with Levemir 100 each 0   Lancets (ONETOUCH DELICA PLUS LANCET30G) MISC USE ONE LANCET TO TEST DAILY 100 each 12   Multiple Vitamin (MULTIVITAMIN) tablet Take 1 tablet by mouth daily.     Omega-3 Fatty Acids  (FISH OIL) 1000 MG CAPS Take 1 capsule by mouth daily.     Semaglutide, 2 MG/DOSE, 8 MG/3ML SOPN Inject 2 mg as directed once a week. 3 mL 0   amLODipine (NORVASC) 10 MG tablet TAKE 1 TABLET BY MOUTH DAILY 90 tablet 0   enalapril (VASOTEC) 10 MG tablet Take 1 tablet (10 mg total) by mouth 2 (two) times daily. 180 tablet 1   furosemide (LASIX) 20 MG tablet TAKE 1 TABLET BY MOUTH DAILY 90 tablet 0   metFORMIN (GLUCOPHAGE) 1000 MG tablet TAKE ONE TABLET BY MOUTH TWICE A DAY WITH MEALS 180 tablet 2   metoprolol succinate (TOPROL-XL) 50 MG 24 hr tablet Take 1 tablet (50 mg total) by mouth daily. 90 tablet 1   No facility-administered medications prior to visit.    No Known Allergies  Review of Systems  Constitutional:  Negative for fever and malaise/fatigue.  HENT:  Negative for congestion.   Eyes:  Negative for blurred vision.  Respiratory:  Negative for shortness of breath.   Cardiovascular:  Negative for chest pain, palpitations and leg swelling.  Gastrointestinal:  Negative for abdominal pain, blood in stool and nausea.  Genitourinary:  Negative for dysuria and frequency.  Musculoskeletal:  Negative for falls.  Skin:  Negative for rash.  Neurological:  Negative for dizziness, loss of consciousness and headaches.  Endo/Heme/Allergies:  Negative for environmental allergies.  Psychiatric/Behavioral:  Negative for depression. The patient is not nervous/anxious.        Objective:    Physical Exam Vitals and nursing note reviewed.  Constitutional:      Appearance: She is well-developed.  HENT:     Head: Normocephalic and atraumatic.  Eyes:     Conjunctiva/sclera: Conjunctivae normal.  Neck:     Thyroid: No thyromegaly.     Vascular: No carotid bruit or JVD.  Cardiovascular:     Rate and Rhythm: Normal rate and regular rhythm.     Heart sounds: Normal heart sounds. No murmur heard. Pulmonary:     Effort: Pulmonary effort is normal. No respiratory distress.     Breath sounds:  Normal breath sounds. No wheezing or rales.  Chest:     Chest wall: No tenderness.  Musculoskeletal:     Cervical back: Normal range of motion and neck supple.  Neurological:     Mental Status: She is alert and oriented to person, place, and time.     BP (!) 140/78 (BP Location: Left Arm, Patient Position: Sitting, Cuff Size: Large)   Pulse 84  Temp 97.8 F (36.6 C) (Oral)   Resp 18   Ht 5\' 3"  (1.6 m)   Wt 216 lb 6.4 oz (98.2 kg)   SpO2 99%   BMI 38.33 kg/m  Wt Readings from Last 3 Encounters:  12/15/22 216 lb 6.4 oz (98.2 kg)  06/26/22 220 lb (99.8 kg)  12/12/21 222 lb 9.6 oz (101 kg)       Assessment & Plan:  Type 2 diabetes mellitus with hyperglycemia, without long-term current use of insulin Assessment & Plan: hgba1c to be checked.  , minimize simple carbs. Increase exercise as tolerated. Continue current meds   Orders: -     Comprehensive metabolic panel -     Hemoglobin A1c -     Microalbumin / creatinine urine ratio -     AMB Referral to Community Care Coordinaton (ACO Patients) -     metFORMIN HCl; TAKE ONE TABLET BY MOUTH TWICE A DAY WITH MEALS  Dispense: 180 tablet; Refill: 2  Primary hypertension -     Lipid panel -     CBC with Differential/Platelet -     Comprehensive metabolic panel -     Metoprolol Succinate ER; Take 1 tablet (50 mg total) by mouth daily.  Dispense: 90 tablet; Refill: 1  Hyperlipidemia associated with type 2 diabetes mellitus -     Lipid panel -     CBC with Differential/Platelet -     Comprehensive metabolic panel -     AMB Referral to Community Care Coordinaton (ACO Patients)  Essential hypertension Assessment & Plan: Well controlled, no changes to meds. Encouraged heart healthy diet such as the DASH diet and exercise as tolerated.    Orders: -     amLODIPine Besylate; Take 1 tablet (10 mg total) by mouth daily.  Dispense: 90 tablet; Refill: 0 -     Enalapril Maleate; Take 1 tablet (10 mg total) by mouth 2 (two) times daily.   Dispense: 180 tablet; Refill: 1 -     Furosemide; TAKE 1 TABLET BY MOUTH DAILY  Dispense: 90 tablet; Refill: 0  Hemorrhoids, unspecified hemorrhoid type -     Ambulatory referral to Gastroenterology  Hyperlipidemia LDL goal <100 Assessment & Plan: Encourage heart healthy diet such as MIND or DASH diet, increase exercise, avoid trans fats, simple carbohydrates and processed foods, consider a krill or fish or flaxseed oil cap daily.        I,Rachel Rivera,acting as a Neurosurgeon for [employer], DO.,have documented all relevant documentation on the behalf of Donato Schultz, DO,as directed by  Donato Schultz, DO while in the presence of Donato Schultz, DO.   I, Donato Schultz, DO, personally preformed the services described in this documentation.  All medical record entries made by the scribe were at my direction and in my presence.  I have reviewed the chart and discharge instructions (if applicable) and agree that the record reflects my personal performance and is accurate and complete. 12/15/22   Donato Schultz, DO

## 2022-12-15 NOTE — Assessment & Plan Note (Signed)
Encourage heart healthy diet such as MIND or DASH diet, increase exercise, avoid trans fats, simple carbohydrates and processed foods, consider a krill or fish or flaxseed oil cap daily.  °

## 2022-12-16 ENCOUNTER — Telehealth: Payer: Self-pay

## 2022-12-16 NOTE — Progress Notes (Signed)
  Care Coordination  Note  12/16/2022 Name: Fina Fread MRN: [id number] DOB: [birth year]  Dreana Ortlip is a 72 y.o. year old female who is a primary care patient of Donato Schultz, DO. I reached out to [employer] by phone today to offer care coordination services.      Ms. Rivere was given information about Care Coordination services today including:  The Care Coordination services include support from the care team which includes your Nurse Coordinator, Clinical Social Worker, or Pharmacist.  The Care Coordination team is here to help remove barriers to the health concerns and goals most important to you. Care Coordination services are voluntary and the patient may decline or stop services at any time by request to their care team member.   Patient agreed to services and verbal consent obtained.   Follow up plan: Telephone appointment with care coordination team member scheduled for:03/09/2023  Penne Lash, RMA Care Guide [facility]  [place], [place] [address] Direct Dial: [phone] [email or web address]

## 2022-12-30 ENCOUNTER — Telehealth: Payer: Self-pay

## 2022-12-30 NOTE — Telephone Encounter (Signed)
Pt called. LVM advising that Guinea-Bissau  and Novofine tips have arrived. Tresiba placed in the frig and tips placed at the front.

## 2023-01-01 DIAGNOSIS — H524 Presbyopia: Secondary | ICD-10-CM | POA: Diagnosis not present

## 2023-01-01 DIAGNOSIS — Z7984 Long term (current) use of oral hypoglycemic drugs: Secondary | ICD-10-CM | POA: Diagnosis not present

## 2023-01-01 DIAGNOSIS — E1136 Type 2 diabetes mellitus with diabetic cataract: Secondary | ICD-10-CM | POA: Diagnosis not present

## 2023-01-01 DIAGNOSIS — H52203 Unspecified astigmatism, bilateral: Secondary | ICD-10-CM | POA: Diagnosis not present

## 2023-01-01 DIAGNOSIS — H5213 Myopia, bilateral: Secondary | ICD-10-CM | POA: Diagnosis not present

## 2023-01-01 DIAGNOSIS — Z7985 Long-term (current) use of injectable non-insulin antidiabetic drugs: Secondary | ICD-10-CM | POA: Diagnosis not present

## 2023-01-01 DIAGNOSIS — H43393 Other vitreous opacities, bilateral: Secondary | ICD-10-CM | POA: Diagnosis not present

## 2023-01-01 DIAGNOSIS — Z794 Long term (current) use of insulin: Secondary | ICD-10-CM | POA: Diagnosis not present

## 2023-01-01 DIAGNOSIS — H25813 Combined forms of age-related cataract, bilateral: Secondary | ICD-10-CM | POA: Diagnosis not present

## 2023-01-01 LAB — HM DIABETES EYE EXAM

## 2023-03-09 ENCOUNTER — Ambulatory Visit: Payer: Medicare HMO | Admitting: Pharmacist

## 2023-03-09 ENCOUNTER — Encounter: Payer: Self-pay | Admitting: Family Medicine

## 2023-03-09 DIAGNOSIS — E1169 Type 2 diabetes mellitus with other specified complication: Secondary | ICD-10-CM

## 2023-03-09 DIAGNOSIS — I10 Essential (primary) hypertension: Secondary | ICD-10-CM

## 2023-03-09 DIAGNOSIS — E1165 Type 2 diabetes mellitus with hyperglycemia: Secondary | ICD-10-CM

## 2023-03-09 MED ORDER — FREESTYLE LIBRE 3 SENSOR MISC: 5 refills | Status: DC

## 2023-03-09 NOTE — Progress Notes (Addendum)
03/09/2023 Name: Jane Young MRN: [id number] DOB: September 16, 1950  Chief Complaint  Patient presents with   Medication Management   Diabetes    Continuous Glucose Monitor     Jane Young is a 72 y.o. year old female who presented for a telephone visit.   They were referred to the pharmacist by their PCP for assistance in managing diabetes and medication access.    Subjective:  Medication Access/Adherence  Current Pharmacy:  [id number] - [place], [place] - [address] [place] [address] Phone: [phone] Fax: [phone]   Patient reports affordability concerns with their medications: Yes  - getting Evaristo Bury / Levemir and Ozempic from [employer] medication assistance program.  Patient reports access/transportation concerns to their pharmacy: No  Patient reports adherence concerns with their medications:  No      Diabetes:  Current medications: Levemir 20 units each morning and 45 units each evening. Patient has been changed to [place] 20 units daily but she has not started [place]-B[place] yet as she is trying to use up supply of Levemir. She is also taking Ozempic 2mg  weekly.   Current glucose readings: 70 to 140 Using One Touch meter - testing rarely.  Patient has tried [place] 2 sensor in past for 2 weeks in 2022 - sensor was given to patient by her sister. Patient liked using the sensor but at the time was not covered by [facility] unless patient was using insulin 3 times or more per day.   Patient reports hypoglycemic s/sx including shakiness and sweating a few times per month.  Patient denies hyperglycemic symptoms including no polyuria, polydipsia, polyphagia, nocturia, neuropathy, blurred vision.  Current medication access support: gets Guinea-Bissau and Ozempic from [employer] medication assistance program.   Hypertension:  Current medications: Enalapril 10mg  twice a day, Metoprolol ER 50mg  daily , Amlodipine  95m daily And Furosemide 20mg  daily (also for swelling / fluid retention)   Medications previously tried: none  Patient has a validated, automated, upper arm home BP cuff Current blood pressure readings readings: 120 to 140 / 70's She does report history of white coat hypertension.   Patient denies hypotensive s/sx including no dizziness, lightheadedness.  Patient denies hypertensive symptoms including no headache, chest pain, shortness of breath   Hyperlipidemia/ASCVD Risk Reduction  Current lipid lowering medications: Atorvastatin 80mg  daily  and Omega 3 Fatty Acids / Fish Oil - 1000mg  daily    Objective:  Lab Results  Component Value Date   HGBA1C 6.9 (H) 12/15/2022    Lab Results  Component Value Date   CREATININE 0.97 12/15/2022   BUN 21 12/15/2022   NA 139 12/15/2022   K 3.8 12/15/2022   CL 100 12/15/2022   CO2 28 12/15/2022    Lab Results  Component Value Date   CHOL 173 12/15/2022   HDL 51.20 12/15/2022   LDLCALC 88 12/15/2022   LDLDIRECT 83.0 06/26/2022   TRIG 170.0 (H) 12/15/2022   CHOLHDL 3 12/15/2022    Medications Reviewed Today     Reviewed by Donato Schultz, DO (Physician) on 12/15/22 at 1346  Med List Status: <None>   Medication Order Taking? Sig Documenting Provider Last Dose Status Informant  amLODipine (NORVASC) 10 MG tablet [id number]  Take 1 tablet (10 mg total) by mouth daily. Donato Schultz, DO  Active   aspirin EC 81 MG tablet [id number] Yes Take 81 mg by mouth daily. Swallow whole. [provider] Taking Active  atorvastatin (LIPITOR) 80 MG tablet [id number] Yes Take 1 tablet (80 mg total) by mouth daily. Donato Schultz, DO Taking Active   blood glucose meter kit and supplies KIT [id number] Yes As directed qd Zola Button, Grayling Congress, DO Taking Active   enalapril (VASOTEC) 10 MG tablet [id number]  Take 1 tablet (10 mg total) by mouth 2 (two) times daily. Zola Button, Myrene Buddy R, DO  Active   furosemide (LASIX) 20 MG  tablet [id number]  TAKE 1 TABLET BY MOUTH DAILY Zola Button, Grayling Congress, DO  Active   glucose blood [facility] VERIO) test strip [id number] Yes USE ONE STRIP TO TEST DAILY Zola Button, Grayling Congress, DO Taking Active   insulin degludec (TRESIBA FLEXTOUCH) 100 UNIT/ML FlexTouch Pen [id number] Yes Inject 20 Units into the skin daily. Seabron Spates R, DO Taking Active   Insulin Pen Needle (NOVOFINE PLUS PEN NEEDLE) 32G X 4 MM MISC [id number] Yes Use twice daily with Levemir Donato Schultz, DO Taking Active   Lancets [facility] DELICA PLUS [place]) MISC [id number] Yes USE ONE LANCET TO TEST DAILY Donato Schultz, DO Taking Active   metFORMIN (GLUCOPHAGE) 1000 MG tablet [id number]  TAKE ONE TABLET BY MOUTH TWICE A DAY WITH MEALS Zola Button, Yvonne R, DO  Active   metoprolol succinate (TOPROL-XL) 50 MG 24 hr tablet [id number]  Take 1 tablet (50 mg total) by mouth daily. Donato Schultz, DO  Active   Multiple Vitamin (MULTIVITAMIN) tablet [id number] Yes Take 1 tablet by mouth daily. [provider] Taking Active   Omega-3 Fatty Acids (FISH OIL) 1000 MG CAPS [id number] Yes Take 1 capsule by mouth daily. [provider] Taking Active   Semaglutide, 2 MG/DOSE, 8 MG/3ML SOPN [id number] Yes Inject 2 mg as directed once a week. Donato Schultz, DO Taking Active               Assessment/Plan:   Diabetes:A1c at goal; appears patient is having some lows during the night.  - Reviewed goal A1c, goal fasting, and goal 2 hour post prandial glucose - Recommend to complete supply of Levemir at current dose - will adjust once we have report from Continuous Glucose Monitor.  - Discussed difference between Guinea-Bissau and Levemir - Evaristo Bury usually causes less hypoglycemia. Discussed that we might need to adjust Tresiba down a little when transitioning from Levemir.  Continue Ozempic 2mg  weekly - Recommend to check glucose 2 times a day until Continuous Glucose Monitor started.  - also  discussed diabetic eye exam - patient saw Dr Nile Riggs 12/2022 - sent message to Cephas Darby, RN to abstract eye exam - noted to have not signs of retinopathy in either eye.    Hypertension:Home blood pressure at goal but office blood pressure slightly elevated.  Recommended to check home blood pressure and heart rate 2 to 3 times per week - Recommend to continue current medications for lowering BP ; Reminded that refills for blood pressure meds will be due soon.   Hyperlipidemia/ASCVD Risk Reduction: LDL goal < 100; Tg< 150 - Recommend to continue atorvastatin 80mg  daily and Omege 3 FA.     Follow Up Plan: patient will come in for education on Continuous Glucose Monitor when she returns from vacation in 2 weeks.   Henrene Pastor, PharmD Clinical Pharmacist [facility]

## 2023-03-10 ENCOUNTER — Telehealth: Payer: Self-pay

## 2023-03-10 NOTE — Telephone Encounter (Signed)
Pt called. LVM advising Ozempic had arrived and is ready for pickup. Placed in the back frig.

## 2023-03-22 ENCOUNTER — Other Ambulatory Visit: Payer: Self-pay | Admitting: Family Medicine

## 2023-03-22 DIAGNOSIS — I10 Essential (primary) hypertension: Secondary | ICD-10-CM

## 2023-03-25 ENCOUNTER — Telehealth: Payer: Self-pay

## 2023-03-25 NOTE — Telephone Encounter (Signed)
Received request for Tresiba 100U/ML. No refills left and enrollment will expire on 09/08/2023. Forms placed in folder for sig.

## 2023-03-26 ENCOUNTER — Telehealth: Payer: Self-pay | Admitting: Pharmacist

## 2023-03-26 NOTE — Telephone Encounter (Signed)
Patient was called to see if she received Libre 3 sensors yet and to set up appointment for Continuous Glucose Monitor / Sensor education.   Unsuccessful out reach. LM on VM with CB# [phone] or [phone]  Henrene Pastor, PharmD Clinical Pharmacist [facility]

## 2023-03-28 ENCOUNTER — Other Ambulatory Visit: Payer: Self-pay | Admitting: Family Medicine

## 2023-03-28 DIAGNOSIS — I10 Essential (primary) hypertension: Secondary | ICD-10-CM

## 2023-03-28 DIAGNOSIS — E785 Hyperlipidemia, unspecified: Secondary | ICD-10-CM

## 2023-04-08 ENCOUNTER — Telehealth: Payer: Self-pay

## 2023-04-08 NOTE — Telephone Encounter (Signed)
Spoke with patient. Pt advised that needles and Evaristo Bury are ready for pickup. Needles placed upfront. Meds placed in frig by [place].

## 2023-04-20 ENCOUNTER — Ambulatory Visit: Payer: Medicare HMO | Admitting: Pharmacist

## 2023-04-20 DIAGNOSIS — I10 Essential (primary) hypertension: Secondary | ICD-10-CM | POA: Diagnosis not present

## 2023-04-20 DIAGNOSIS — Z794 Long term (current) use of insulin: Secondary | ICD-10-CM | POA: Diagnosis not present

## 2023-04-20 DIAGNOSIS — E785 Hyperlipidemia, unspecified: Secondary | ICD-10-CM

## 2023-04-20 DIAGNOSIS — E11649 Type 2 diabetes mellitus with hypoglycemia without coma: Secondary | ICD-10-CM

## 2023-04-20 NOTE — Progress Notes (Signed)
04/20/2023 Name: Jane Young MRN: [id number] DOB: 12/08/1950  Chief Complaint  Patient presents with   Diabetes    Continuous Glucose Monitor education    Jane Young is a 71 y.o. year old female who presented for a telephone visit.   They were referred to the pharmacist by their PCP for assistance in managing diabetes and medication access.    Subjective:  Medication Access/Adherence  Current Pharmacy:  [id number] - [place], [place] - [address] [place] [address] Phone: [phone] Fax: [phone]   Patient reports affordability concerns with their medications: Yes  - getting Evaristo Bury / Levemir and Ozempic from [employer] medication assistance program.  Patient reports access/transportation concerns to their pharmacy: No  Patient reports adherence concerns with their medications:  No      Diabetes:  Current medications: Metformin 1000mg  twice a day, Ozempic 2mg  weekly and Levemir 20 units each morning and 45 units each evening. Patient has received first shipment of Tresiba but has not transitioned to Guinea-Bissau yet. Plan to is start with Tresiba 20 units daily and adjust as needed.   Current glucose readings: 70 to 140 Using One Touch meter - testing rarely.  Patient has tried [place] 2 sensor in past for 2 weeks in 2022 - sensor was given to patient by her sister. Patient liked using the sensor but at the time was not covered by [facility] unless patient was using insulin 3 times or more per day.  She has been recently approved for the [place] 3 sensors.   Patient reports hypoglycemic s/sx including shakiness and sweating a few times per month. Usually occurs at night. Patient denies hyperglycemic symptoms including no polyuria, polydipsia, polyphagia, nocturia, neuropathy, blurred vision.  Current medication access support: gets Guinea-Bissau and Ozempic from [employer] medication assistance program.    Hypertension:  Current medications: Enalapril 10mg  twice a day, Metoprolol ER 50mg  daily , Amlodipine 29m daily And Furosemide 20mg  daily (also for swelling / fluid retention)   Medications previously tried: none  Patient has a validated, automated, upper arm home BP cuff Current blood pressure readings readings: 120's to 140 / 70's She does report history of white coat hypertension.   Patient denies hypotensive s/sx including no dizziness, lightheadedness.  Patient denies hypertensive symptoms including no headache, chest pain, shortness of breath   Hyperlipidemia/ASCVD Risk Reduction  Current lipid lowering medications: Atorvastatin 80mg  daily  and Omega 3 Fatty Acids / Fish Oil - 1000mg  daily    Objective:  Lab Results  Component Value Date   HGBA1C 6.9 (H) 12/15/2022    Lab Results  Component Value Date   CREATININE 0.97 12/15/2022   BUN 21 12/15/2022   NA 139 12/15/2022   K 3.8 12/15/2022   CL 100 12/15/2022   CO2 28 12/15/2022    Lab Results  Component Value Date   CHOL 173 12/15/2022   HDL 51.20 12/15/2022   LDLCALC 88 12/15/2022   LDLDIRECT 83.0 06/26/2022   TRIG 170.0 (H) 12/15/2022   CHOLHDL 3 12/15/2022    Medications Reviewed Today   Medications were not reviewed in this encounter       Assessment/Plan:   Diabetes:A1c at goal; appears patient is having some lows during the night.  - Reviewed goal A1c, goal fasting, and goal 2 hour post prandial glucose - Patient received the following instruction for Freestyle Libre 3 Personal CGM:   - preparation of placement site - clean with  alcohol and allow to dry.  Sensor is to only be place on back of upper arm.  Patient to rotate sides and site.   -care of sensor and site   - reminded that sensor is waterproof up to 3 feet and for 30 minutes.   - Assisted in downloading [place] 3 app to her phone and with app / account set up.   - reviewed Libre 3 app home screen and how to read and respond to  trend arrows.   - reminded that when magnifying glass symbols shows up she is to confirm BG with finger stick before making any treatment decisions. .   - pt signed up for libre view in office. Linked with PCP office.   - Patient will start Tresiba 20 units daily tomorrow and stop Levemir. She will also continue Ozempic 2mg  weekly   Hypertension:Home blood pressure at goal but office blood pressure slightly elevated.  Recommended to check home blood pressure and heart rate 2 to 3 times per week - Recommend to continue current medications for lowering BP   Hyperlipidemia/ASCVD Risk Reduction: LDL goal < 100; Tg< 150 - Recommend to continue atorvastatin 80mg  daily and Omege 3 FA.    Follow Up Plan: 2 weeks to review Continuous Glucose Monitor report over phone.  Henrene Pastor, PharmD Clinical Pharmacist [facility]

## 2023-04-29 ENCOUNTER — Ambulatory Visit (INDEPENDENT_AMBULATORY_CARE_PROVIDER_SITE_OTHER): Payer: Medicare HMO | Admitting: Pharmacist

## 2023-04-29 DIAGNOSIS — E11649 Type 2 diabetes mellitus with hypoglycemia without coma: Secondary | ICD-10-CM

## 2023-04-29 DIAGNOSIS — Z794 Long term (current) use of insulin: Secondary | ICD-10-CM

## 2023-04-29 MED ORDER — TRESIBA FLEXTOUCH 100 UNIT/ML ~~LOC~~ SOPN: 18.0000 [IU] | PEN_INJECTOR | Freq: Every day | SUBCUTANEOUS | Status: DC

## 2023-04-29 NOTE — Progress Notes (Signed)
   04/29/2023 Name: Jane Young MRN: [id number] DOB: [birth year]  Chief Complaint  Patient presents with   Diabetes    Follow up Continuous Glucose Monitor / Jane Young start     Jane Young is a 72 y.o. year old female who presented for a telephone visit.   They were referred to the pharmacist by their PCP for assistance in managing diabetes and medication access.    Subjective:  Medication Access/Adherence  Current Pharmacy:  [id number] - [place], [place] - [address] [place] [address] Phone: [phone] Fax: [phone]   Patient reports affordability concerns with their medications: Yes  - getting Jane Young and Ozempic from [employer] medication assistance program.  Patient reports access/transportation concerns to their pharmacy: No  Patient reports adherence concerns with their medications:  No      Diabetes:  Current medications: Metformin 1000mg  twice a day, Ozempic 2mg  weekly and Tresiba 20 units each morning. Transitioned from Levemir 20u qam and 45 units qpm to Jane Young on Thursday, August 15th.  Jane Young using Libre 3 Continuous Glucose Monitor to monitor blood glucose 04/16/2023 See report below.  Ave blood glucose = 121 GMI = 6.2% Time in range 91%  Has noted several early morning lows. Patient reports on the morning of 08/19 she had alert of low and then about 1 hour later received message to remove sensor and replace due to sensor malfunction.  Also noticed a few days of blood glucose > 250.  Had chick fil a and sweet tea - Friday, August 16th - blood glucose increased to 279.        Patient reports hypoglycemic s/sx including shakiness and sweating a few times per month. Usually occurs at night. Patient denies hyperglycemic symptoms including no polyuria, polydipsia, polyphagia, nocturia, neuropathy, blurred vision.  Current medication access support: gets Jane Young and Ozempic from  [employer] medication assistance program.    Objective:  Lab Results  Component Value Date   HGBA1C 6.9 (H) 12/15/2022    Lab Results  Component Value Date   CREATININE 0.97 12/15/2022   BUN 21 12/15/2022   NA 139 12/15/2022   K 3.8 12/15/2022   CL 100 12/15/2022   CO2 28 12/15/2022    Lab Results  Component Value Date   CHOL 173 12/15/2022   HDL 51.20 12/15/2022   LDLCALC 88 12/15/2022   LDLDIRECT 83.0 06/26/2022   TRIG 170.0 (H) 12/15/2022   CHOLHDL 3 12/15/2022    Medications Reviewed Today   Medications were not reviewed in this encounter       Assessment/Plan:   Diabetes:A1c at goal - some hypoglycemia at night and some highs after evening meal - usually related to dietary choices - Reviewed goal A1c, goal fasting, and goal 2 hour post prandial glucose - Reviewed Continuous Glucose Monitor report - Recommended lowering dose of Tresiba to 18 units once a day  - Continue Ozempic 2mg  weekly and metformin 1000mg  twice a day  - Discussed cutting out sweetened beverages and decreasing portion sizes at evening meal.  Patient to call office is she continue to have low blood glucose. - Reviewed how to treat low blood glucose.  - Provided customer service number for [employer] [facility] for patient to request replacement sensor.    Follow Up Plan: 3 weeks (patient will be on vacation 08/30 thru 09/08  Henrene Pastor, PharmD Clinical Pharmacist [facility]

## 2023-05-27 ENCOUNTER — Telehealth: Payer: Medicare HMO

## 2023-06-08 ENCOUNTER — Ambulatory Visit: Payer: Medicare HMO | Admitting: Pharmacist

## 2023-06-08 DIAGNOSIS — Z794 Long term (current) use of insulin: Secondary | ICD-10-CM

## 2023-06-08 NOTE — Progress Notes (Signed)
06/08/2023 Name: Jane Young MRN: [id number] DOB: 22-May-1951  Chief Complaint  Patient presents with   Diabetes    Analiese Young is a 72 y.o. year old female who presented for a telephone visit.   They were referred to the pharmacist by their PCP for assistance in managing diabetes and medication access.    Subjective:  Medication Access/Adherence  Current Pharmacy:  [id number] - [place], [place] - [address] [place] [address] Phone: [phone] Fax: [phone]   Patient reports affordability concerns with their medications: Yes  - getting Evaristo Bury and Ozempic from [employer] medication assistance program.  Patient reports access/transportation concerns to their pharmacy: No  Patient reports adherence concerns with their medications:  No      Diabetes:  Current medications: Metformin 1000mg  twice a day, Ozempic 2mg  weekly and Tresiba 20 units each morning. Tresiba 18 units daily.  .  Started using Libre 3 Continuous Glucose Monitor to monitor blood glucose 04/16/2023 See report below form 05/26/2023 to 06/08/2023.  Weight = 214 lbs yesterday at home.  Wt Readings from Last 3 Encounters:  12/15/22 216 lb 6.4 oz (98.2 kg)  06/26/22 220 lb (99.8 kg)  12/12/21 222 lb 9.6 oz (101 kg)    Date of Download: 06/08/2023 % Time CGM is active: 52% Average Glucose: 129 mg/dL Glucose Management Indicator: 6.4%  Glucose Variability: 25.2% (goal <36%) Time in Goal:  - Time in range 70-180: 92% - Time above range: 8% - Time below range: 0%  Observed patterns: Improved blood glucose with no lows over the last 2 weeks.      Diet:  Has changed lunch from sandwich to Boost protein drink + fruit or vegetables or nachos + salsa Also noticed a few days of blood glucose > 250 but has decreased significantly. Highs are usually when she has a meal with her daughter and they have a dessert.  She is avoiding sugar  containing drinks.   Patient reports no hypoglycemic s/sx including shakiness and sweating. Patient denies hyperglycemic symptoms including no polyuria, polydipsia, polyphagia, nocturia, neuropathy, blurred vision.  Current medication access support: gets [place] and Ozempic from [employer] medication assistance program.    Objective:  Lab Results  Component Value Date   HGBA1C 6.9 (H) 12/15/2022    Lab Results  Component Value Date   CREATININE 0.97 12/15/2022   BUN 21 12/15/2022   NA 139 12/15/2022   K 3.8 12/15/2022   CL 100 12/15/2022   CO2 28 12/15/2022    Lab Results  Component Value Date   CHOL 173 12/15/2022   HDL 51.20 12/15/2022   LDLCALC 88 12/15/2022   LDLDIRECT 83.0 06/26/2022   TRIG 170.0 (H) 12/15/2022   CHOLHDL 3 12/15/2022    Medications Reviewed Today     Reviewed by Henrene Pastor, RPH-CPP (Pharmacist) on 06/08/23 at 1025  Med List Status: <None>   Medication Order Taking? Sig Documenting Provider Last Dose Status Informant  amLODipine (NORVASC) 10 MG tablet [id number] Yes TAKE 1 TABLET BY MOUTH DAILY Donato Schultz, DO Taking Active   aspirin EC 81 MG tablet [id number] Yes Take 81 mg by mouth daily. Swallow whole. [provider] Taking Active   atorvastatin (LIPITOR) 80 MG tablet [id number] Yes TAKE 1 TABLET BY MOUTH DAILY Donato Schultz, DO Taking Active   blood glucose meter kit and supplies KIT [id number] No As directed qd  Patient not taking: Reported on 06/08/2023  Donato Schultz, DO Not Taking Active   Continuous Glucose Sensor (FREESTYLE LIBRE 3 SENSOR) [place] [id number] Yes Place 1 sensor on the skin every 14 days. Use to check glucose continuously Zola Button, [place] R, DO Taking Active   enalapril (VASOTEC) 10 MG tablet [id number] Yes TAKE 1 TABLET BY MOUTH 2 TIMES A DAY Lowne Chase, [place]ling Congress, DO Taking Active   furosemide (LASIX) 20 MG tablet [id number] Yes TAKE 1 TABLET BY MOUTH DAILY Seabron Spates R, DO  Taking Active   glucose blood [facility] VERIO) test strip [id number]  USE ONE STRIP TO TEST DAILY Zola Button, [place]ling Congress, DO  Active   insulin degludec (TRESIBA FLEXTOUCH) 100 UNIT/ML FlexTouch Pen [id number] Yes Inject 18 Units into the skin daily. Donato Schultz, DO Taking Active            Med Note Marius Ditch Jun 08, 2023 10:24 AM) [employer] medication assistance program thru 09/08/2023  Insulin Pen Needle (NOVOFINE PLUS PEN NEEDLE) 32G X 4 MM MISC [id number] Yes Use twice daily with Levemir Donato Schultz, DO Taking Active   Lancets [facility] DELICA PLUS [place]) MISC [id number]  USE ONE LANCET TO TEST DAILY Donato Schultz, DO  Active   metFORMIN (GLUCOPHAGE) 1000 MG tablet [id number] Yes TAKE ONE TABLET BY MOUTH TWICE A DAY WITH MEALS Seabron Spates R, DO Taking Active   metoprolol succinate (TOPROL-XL) 50 MG 24 hr tablet [id number] Yes TAKE 1 TABLET (50 MG TOTAL) BY MOUTH DAILY. Donato Schultz, DO Taking Active   Multiple Vitamin (MULTIVITAMIN) tablet [id number] Yes Take 1 tablet by mouth daily. [provider] Taking Active   Omega-3 Fatty Acids (FISH OIL) 1000 MG CAPS [id number] Yes Take 1 capsule by mouth daily. [provider] Taking Active   Semaglutide, 2 MG/DOSE, 8 MG/3ML SOPN [id number] Yes Inject 2 mg as directed once a week. Donato Schultz, DO Taking Active            Med Note Marius Ditch Mar 09, 2023 11:07 AM) 2024 - Novo Nordisk PAP              Assessment/Plan:   Diabetes:A1c at goal - blood glucose much improved, no recent lows/ only 2 recent blood glucose readings > 250 - Reviewed goal A1c, goal fasting, and goal 2 hour post prandial glucose - Reviewed Continuous Glucose Monitor report - continue Tresiba to 18 units once a day  - Continue Ozempic 2mg  weekly and metformin 1000mg  twice a day  - Continue to limit intake of high sugar and carbohydrate foods - Goal is 45 gram or less of  carbohydrates per meal.    Follow Up Plan: 07/15/2023 - reviewed DM / order Ozempic and Evaristo Bury (has to be before 08/08/2023) and start 2025 medication assistance program application  Henrene Pastor, PharmD Clinical Pharmacist [facility]

## 2023-06-19 ENCOUNTER — Encounter: Payer: Self-pay | Admitting: Family Medicine

## 2023-06-19 ENCOUNTER — Ambulatory Visit: Payer: Medicare HMO | Admitting: Family Medicine

## 2023-06-19 VITALS — BP 150/88 | HR 85 | Temp 98.7°F | Resp 18 | Ht 63.0 in | Wt 214.8 lb

## 2023-06-19 DIAGNOSIS — E1165 Type 2 diabetes mellitus with hyperglycemia: Secondary | ICD-10-CM

## 2023-06-19 DIAGNOSIS — Z23 Encounter for immunization: Secondary | ICD-10-CM

## 2023-06-19 DIAGNOSIS — E785 Hyperlipidemia, unspecified: Secondary | ICD-10-CM | POA: Diagnosis not present

## 2023-06-19 DIAGNOSIS — I10 Essential (primary) hypertension: Secondary | ICD-10-CM | POA: Diagnosis not present

## 2023-06-19 DIAGNOSIS — E1169 Type 2 diabetes mellitus with other specified complication: Secondary | ICD-10-CM

## 2023-06-19 DIAGNOSIS — Z794 Long term (current) use of insulin: Secondary | ICD-10-CM

## 2023-06-19 LAB — LIPID PANEL
Cholesterol: 137 mg/dL (ref 0–200)
HDL: 47.6 mg/dL (ref >39.00)
LDL Cholesterol: 66 mg/dL (ref 0–99)
NonHDL: 89.89
Total CHOL/HDL Ratio: 3
Triglycerides: 121 mg/dL (ref 0.0–149.0)
VLDL: 24.2 mg/dL (ref 0.0–40.0)

## 2023-06-19 LAB — CBC WITH DIFFERENTIAL/PLATELET
Basophils Absolute: 0 10*3/uL (ref 0.0–0.1)
Basophils Relative: 0.9 % (ref 0.0–3.0)
Eosinophils Absolute: 0.2 10*3/uL (ref 0.0–0.7)
Eosinophils Relative: 3.9 % (ref 0.0–5.0)
HCT: 41 % (ref 36.0–46.0)
Hemoglobin: 13.3 g/dL (ref 12.0–15.0)
Lymphocytes Relative: 43.7 % (ref 12.0–46.0)
Lymphs Abs: 2.3 10*3/uL (ref 0.7–4.0)
MCHC: 32.3 g/dL (ref 30.0–36.0)
MCV: 85.7 fL (ref 78.0–100.0)
Monocytes Absolute: 0.4 10*3/uL (ref 0.1–1.0)
Monocytes Relative: 8.1 % (ref 3.0–12.0)
Neutro Abs: 2.3 10*3/uL (ref 1.4–7.7)
Neutrophils Relative %: 43.4 % (ref 43.0–77.0)
Platelets: 196 10*3/uL (ref 150.0–400.0)
RBC: 4.78 Mil/uL (ref 3.87–5.11)
RDW: 14 % (ref 11.5–15.5)
WBC: 5.2 10*3/uL (ref 4.0–10.5)

## 2023-06-19 LAB — COMPREHENSIVE METABOLIC PANEL
ALT: 23 U/L (ref 0–35)
AST: 21 U/L (ref 0–37)
Albumin: 4.3 g/dL (ref 3.5–5.2)
Alkaline Phosphatase: 53 U/L (ref 39–117)
BUN: 17 mg/dL (ref 6–23)
CO2: 29 meq/L (ref 19–32)
Calcium: 10.2 mg/dL (ref 8.4–10.5)
Chloride: 101 meq/L (ref 96–112)
Creatinine, Ser: 0.8 mg/dL (ref 0.40–1.20)
GFR: 74.62 mL/min (ref >60.00)
Glucose, Bld: 114 mg/dL — ABNORMAL HIGH (ref 70–99)
Potassium: 3.7 meq/L (ref 3.5–5.1)
Sodium: 141 meq/L (ref 135–145)
Total Bilirubin: 0.7 mg/dL (ref 0.2–1.2)
Total Protein: 6.4 g/dL (ref 6.0–8.3)

## 2023-06-19 LAB — MICROALBUMIN / CREATININE URINE RATIO
Creatinine,U: 57.6 mg/dL
Microalb Creat Ratio: 1.2 mg/g (ref 0.0–30.0)
Microalb, Ur: <0.7 mg/dL (ref 0.0–1.9)

## 2023-06-19 LAB — HEMOGLOBIN A1C: Hgb A1c MFr Bld: 7 % — ABNORMAL HIGH (ref 4.6–6.5)

## 2023-06-19 MED ORDER — METOPROLOL SUCCINATE ER 50 MG PO TB24
50.0000 mg | ORAL_TABLET | Freq: Every day | ORAL | 1 refills | Status: DC
Start: 2023-06-19 — End: 2023-12-18

## 2023-06-19 MED ORDER — METFORMIN HCL 1000 MG PO TABS
ORAL_TABLET | ORAL | 2 refills | Status: DC
Start: 2023-06-19 — End: 2023-12-18

## 2023-06-19 MED ORDER — ATORVASTATIN CALCIUM 80 MG PO TABS
80.0000 mg | ORAL_TABLET | Freq: Every day | ORAL | 3 refills | Status: DC
Start: 2023-06-19 — End: 2023-12-18

## 2023-06-19 MED ORDER — ENALAPRIL MALEATE 10 MG PO TABS
10.0000 mg | ORAL_TABLET | Freq: Two times a day (BID) | ORAL | 1 refills | Status: DC
Start: 2023-06-19 — End: 2023-12-18

## 2023-06-19 MED ORDER — AMLODIPINE BESYLATE 10 MG PO TABS
10.0000 mg | ORAL_TABLET | Freq: Every day | ORAL | 3 refills | Status: DC
Start: 2023-06-19 — End: 2023-12-18

## 2023-06-19 MED ORDER — FUROSEMIDE 20 MG PO TABS
ORAL_TABLET | ORAL | 3 refills | Status: DC
Start: 2023-06-19 — End: 2023-12-18

## 2023-06-19 NOTE — Assessment & Plan Note (Signed)
Encourage heart healthy diet such as MIND or DASH diet, increase exercise, avoid trans fats, simple carbohydrates and processed foods, consider a krill or fish or flaxseed oil cap daily.  °

## 2023-06-19 NOTE — Progress Notes (Signed)
Established Patient Office Visit  Subjective   Patient ID: Jane Young, female    DOB: 08-Apr-1951  Age: 72 y.o. MRN: [id number]  Chief Complaint  Patient presents with   Diabetes   Follow-up    HPI Discussed the use of AI scribe software for clinical note transcription with the patient, who gave verbal consent to proceed.  History of Present Illness   The patient, with a history of diabetes, presents for a routine follow-up and medication refills. She reports that her blood sugars have been "pretty good" and has been monitoring her glucose levels with a device provided by [employer]. She has been in communication with a diabetes educator named Tammy, who has been helping her manage her diabetes. She initially had some issues with her glucose monitor, but these have been resolved. She has been taking [place] for her diabetes, and after some adjustments in the dosage, she reports that her glucose levels have been mostly within the target range. She has not reported any hypoglycemic episodes. The patient also mentions that she is due for a mammogram and colonoscopy, but these have been postponed due to personal circumstances.      Patient Active Problem List   Diagnosis Date Noted   Long term (current) use of insulin (HCC) 04/20/2023   Type 2 diabetes mellitus with hyperglycemia (HCC) 10/07/2007   Hyperlipidemia LDL goal <100 10/07/2007   Essential hypertension 10/07/2007   OVARIAN CYST, LEFT 10/07/2007   CARDIAC MURMUR, HX OF 10/07/2007   TONSILLECTOMY AND ADENOIDECTOMY, HX OF 10/07/2007   Past Medical History:  Diagnosis Date   Diabetes mellitus    Type 2   Heart murmur    Hyperlipidemia    Hypertension    Past Surgical History:  Procedure Laterality Date   APPENDECTOMY     CESAREAN SECTION  1995   OVARIAN CYST REMOVAL     Social History   Tobacco Use   Smoking status: Never   Smokeless tobacco: Never  Substance Use Topics   Alcohol use: Yes   Drug use: No    Social History   Socioeconomic History   Marital status: Married    Spouse name: Not on file   Number of children: Not on file   Years of education: Not on file   Highest education level: Associate degree: occupational, Scientist, product/process development, or vocational program  Occupational History   Not on file  Tobacco Use   Smoking status: Never   Smokeless tobacco: Never  Substance and Sexual Activity   Alcohol use: Yes   Drug use: No   Sexual activity: Not on file  Other Topics Concern   Not on file  Social History Narrative   Not on file   Social Determinants of Health   Financial Resource Strain: Low Risk  (12/08/2022)   Overall Financial Resource Strain (CARDIA)    Difficulty of Paying Living Expenses: Not hard at all  Food Insecurity: No Food Insecurity (12/08/2022)   Hunger Vital Sign    Worried About Running Out of Food in the Last Year: Never true    Ran Out of Food in the Last Year: Never true  Transportation Needs: No Transportation Needs (12/08/2022)   PRAPARE - Administrator, Civil Service (Medical): No    Lack of Transportation (Non-Medical): No  Physical Activity: Insufficiently Active (12/08/2022)   Exercise Vital Sign    Days of Exercise per Week: 2 days    Minutes of Exercise per Session: 30 min  Stress: No  Stress Concern Present (12/08/2022)   [employer] of Occupational Health - Occupational Stress Questionnaire    Feeling of Stress : Not at all  Social Connections: Moderately Integrated (12/08/2022)   Social Connection and Isolation Panel [NHANES]    Frequency of Communication with Friends and Family: More than three times a week    Frequency of Social Gatherings with Friends and Family: Three times a week    Attends Religious Services: 1 to 4 times per year    Active Member of Clubs or Organizations: No    Attends Engineer, structural: Not on file    Marital Status: Married  Catering manager Violence: Not on file   Family Status  Relation Name  Status   Mother  Alive   Father  Deceased   MGM  (Not Specified)   PGM  (Not Specified)  No partnership data on file   Family History  Problem Relation Age of Onset   Hyperlipidemia Mother    Hypertension Mother    Cancer Father        kidney   Cancer Maternal Grandmother    Diabetes Paternal Grandmother    Heart disease Paternal Grandmother       Review of Systems  Constitutional:  Negative for chills, fever and malaise/fatigue.  HENT:  Negative for congestion and hearing loss.   Eyes:  Negative for blurred vision and discharge.  Respiratory:  Negative for cough, sputum production and shortness of breath.   Cardiovascular:  Negative for chest pain, palpitations and leg swelling.  Gastrointestinal:  Negative for abdominal pain, blood in stool, constipation, diarrhea, heartburn, nausea and vomiting.  Genitourinary:  Negative for dysuria, frequency, hematuria and urgency.  Musculoskeletal:  Negative for back pain, falls and myalgias.  Skin:  Negative for rash.  Neurological:  Negative for dizziness, sensory change, loss of consciousness, weakness and headaches.  Endo/Heme/Allergies:  Negative for environmental allergies. Does not bruise/bleed easily.  Psychiatric/Behavioral:  Negative for depression and suicidal ideas. The patient is not nervous/anxious and does not have insomnia.       Objective:     BP (!) 150/88 (BP Location: Left Arm, Patient Position: Sitting, Cuff Size: Large)   Pulse 85   Temp 98.7 F (37.1 C) (Oral)   Resp 18   Ht 5\' 3"  (1.6 m)   Wt 214 lb 12.8 oz (97.4 kg)   SpO2 98%   BMI 38.05 kg/m  BP Readings from Last 3 Encounters:  06/19/23 (!) 150/88  12/15/22 (!) 140/78  06/26/22 (!) 142/80   Wt Readings from Last 3 Encounters:  06/19/23 214 lb 12.8 oz (97.4 kg)  12/15/22 216 lb 6.4 oz (98.2 kg)  06/26/22 220 lb (99.8 kg)   SpO2 Readings from Last 3 Encounters:  06/19/23 98%  12/15/22 99%  06/26/22 98%      Physical Exam Vitals and  nursing note reviewed.  Constitutional:      General: She is not in acute distress.    Appearance: Normal appearance. She is well-developed.  HENT:     Head: Normocephalic and atraumatic.     Right Ear: Tympanic membrane, ear canal and external ear normal. There is no impacted cerumen.     Left Ear: Tympanic membrane, ear canal and external ear normal. There is no impacted cerumen.     Nose: Nose normal.     Mouth/Throat:     Mouth: Mucous membranes are moist.     Pharynx: Oropharynx is clear. No oropharyngeal exudate or posterior oropharyngeal  erythema.  Eyes:     General: No scleral icterus.       Right eye: No discharge.        Left eye: No discharge.     Conjunctiva/sclera: Conjunctivae normal.     Pupils: Pupils are equal, round, and reactive to light.  Neck:     Thyroid: No thyromegaly or thyroid tenderness.     Vascular: No JVD.  Cardiovascular:     Rate and Rhythm: Normal rate and regular rhythm.     Heart sounds: Normal heart sounds. No murmur heard. Pulmonary:     Effort: Pulmonary effort is normal. No respiratory distress.     Breath sounds: Normal breath sounds.  Abdominal:     General: Bowel sounds are normal. There is no distension.     Palpations: Abdomen is soft. There is no mass.     Tenderness: There is no abdominal tenderness. There is no guarding or rebound.  Genitourinary:    Vagina: Normal.  Musculoskeletal:        General: Normal range of motion.     Cervical back: Neck supple.     Right lower leg: No edema.     Left lower leg: No edema.  Lymphadenopathy:     Cervical: No cervical adenopathy.  Skin:    General: Skin is warm and dry.     Findings: No erythema or rash.  Neurological:     Mental Status: She is alert and oriented to person, place, and time.     Cranial Nerves: No cranial nerve deficit.     Deep Tendon Reflexes: Reflexes are normal and symmetric.  Psychiatric:        Mood and Affect: Mood normal.        Behavior: Behavior normal.         Thought Content: Thought content normal.        Judgment: Judgment normal.      No results found for any visits on 06/19/23.  Last CBC Lab Results  Component Value Date   WBC 6.5 12/15/2022   HGB 14.1 12/15/2022   HCT 41.6 12/15/2022   MCV 84.6 12/15/2022   RDW 14.0 12/15/2022   PLT 236.0 12/15/2022   Last metabolic panel Lab Results  Component Value Date   GLUCOSE 93 12/15/2022   NA 139 12/15/2022   K 3.8 12/15/2022   CL 100 12/15/2022   CO2 28 12/15/2022   BUN 21 12/15/2022   CREATININE 0.97 12/15/2022   GFR 59.43 (L) 12/15/2022   CALCIUM 10.9 (H) 12/15/2022   PROT 7.0 12/15/2022   ALBUMIN 4.6 12/15/2022   BILITOT 0.7 12/15/2022   ALKPHOS 67 12/15/2022   AST 21 12/15/2022   ALT 24 12/15/2022   Last lipids Lab Results  Component Value Date   CHOL 173 12/15/2022   HDL 51.20 12/15/2022   LDLCALC 88 12/15/2022   LDLDIRECT 83.0 06/26/2022   TRIG 170.0 (H) 12/15/2022   CHOLHDL 3 12/15/2022   Last hemoglobin A1c Lab Results  Component Value Date   HGBA1C 6.9 (H) 12/15/2022   Last thyroid functions Lab Results  Component Value Date   TSH 3.04 06/26/2022   Last vitamin D No results found for: "25OHVITD2", "25OHVITD3", "VD25OH" Last vitamin B12 and Folate No results found for: "VITAMINB12", "FOLATE"    The 10-year ASCVD risk score (Arnett DK, et al., 2019) is: 29.4%    Assessment & Plan:   Problem List Items Addressed This Visit       Unprioritized  Type 2 diabetes mellitus with hyperglycemia (HCC)   Relevant Medications   atorvastatin (LIPITOR) 80 MG tablet   enalapril (VASOTEC) 10 MG tablet   metFORMIN (GLUCOPHAGE) 1000 MG tablet   Other Relevant Orders   Comprehensive metabolic panel   Hemoglobin A1c   Microalbumin / creatinine urine ratio   Hyperlipidemia LDL goal <100    Encourage heart healthy diet such as MIND or DASH diet, increase exercise, avoid trans fats, simple carbohydrates and processed foods, consider a krill or fish or  flaxseed oil cap daily.        Relevant Medications   amLODipine (NORVASC) 10 MG tablet   atorvastatin (LIPITOR) 80 MG tablet   enalapril (VASOTEC) 10 MG tablet   furosemide (LASIX) 20 MG tablet   metoprolol succinate (TOPROL-XL) 50 MG 24 hr tablet   Essential hypertension    Well controlled, no changes to meds. Encouraged heart healthy diet such as the DASH diet and exercise as tolerated.        Relevant Medications   amLODipine (NORVASC) 10 MG tablet   atorvastatin (LIPITOR) 80 MG tablet   enalapril (VASOTEC) 10 MG tablet   furosemide (LASIX) 20 MG tablet   metoprolol succinate (TOPROL-XL) 50 MG 24 hr tablet   Other Visit Diagnoses     Need for influenza vaccination    -  Primary   Relevant Orders   Flu Vaccine Trivalent High Dose (Fluad) (Completed)   Hyperlipidemia associated with type 2 diabetes mellitus (HCC)       Relevant Medications   amLODipine (NORVASC) 10 MG tablet   atorvastatin (LIPITOR) 80 MG tablet   enalapril (VASOTEC) 10 MG tablet   furosemide (LASIX) 20 MG tablet   metFORMIN (GLUCOPHAGE) 1000 MG tablet   metoprolol succinate (TOPROL-XL) 50 MG 24 hr tablet   Other Relevant Orders   Lipid panel   CBC with Differential/Platelet   Comprehensive metabolic panel   Primary hypertension       Relevant Medications   amLODipine (NORVASC) 10 MG tablet   atorvastatin (LIPITOR) 80 MG tablet   enalapril (VASOTEC) 10 MG tablet   furosemide (LASIX) 20 MG tablet   metoprolol succinate (TOPROL-XL) 50 MG 24 hr tablet   Other Relevant Orders   Comprehensive metabolic panel   Hemoglobin A1c   Microalbumin / creatinine urine ratio     Assessment and Plan    Diabetes Mellitus Patient reports good control with Tresiba 18 units daily. No reported hypoglycemia. Patient is using a continuous glucose monitor with good results. -Refill Evaristo Bury prescription. -Check HbA1c and other routine labs.  General Health Maintenance -Administer influenza vaccine today. -Discussed  the need for mammogram and colonoscopy, patient plans to schedule after upcoming family event.        No follow-ups on file.    Donato Schultz, DO

## 2023-06-19 NOTE — Assessment & Plan Note (Signed)
Well controlled, no changes to meds. Encouraged heart healthy diet such as the DASH diet and exercise as tolerated.  °

## 2023-07-15 ENCOUNTER — Ambulatory Visit: Payer: Medicare HMO | Admitting: Pharmacist

## 2023-07-15 DIAGNOSIS — E11649 Type 2 diabetes mellitus with hypoglycemia without coma: Secondary | ICD-10-CM

## 2023-07-15 DIAGNOSIS — Z794 Long term (current) use of insulin: Secondary | ICD-10-CM | POA: Diagnosis not present

## 2023-07-15 NOTE — Progress Notes (Cosign Needed Addendum)
07/15/2023 Name: Jane Young MRN: [id number] DOB: June 11, 1951  Chief Complaint  Patient presents with   Diabetes   Medication Management    Jane Young is a 72 y.o. year old female who presented for a telephone visit.   They were referred to the pharmacist by their PCP for assistance in managing diabetes and medication access.    Subjective:  Medication Access/Adherence  Current Pharmacy:  [id number] - [place], [place] - [address] [place] [address] Phone: [phone] Fax: [phone]   Patient reports affordability concerns with their medications: Yes  - getting Evaristo Bury and Ozempic from [employer] medication assistance program.  Patient reports access/transportation concerns to their pharmacy: No  Patient reports adherence concerns with their medications:  No      Diabetes:  Current medications: Metformin 1000mg  twice a day, Ozempic 2mg  weekly and Tresiba 18 units daily.  .  Started using Libre 3 Continuous Glucose Monitor to monitor blood glucose 04/16/2023  Wt Readings from Last 3 Encounters:  06/19/23 214 lb 12.8 oz (97.4 kg)  12/15/22 216 lb 6.4 oz (98.2 kg)  06/26/22 220 lb (99.8 kg)   Date of Download: 07/15/2023 - time period was 06/02/2023 thru 06/15/2023 - patient has not worn sensor in last month due to her daughters wedding % Time CGM is active: 95% Average Glucose: 128 mg/dL Glucose Management Indicator: 6.4%  Glucose Variability: 24.7% (goal <36%) Time in Goal:  - Time in range 70-180: 93% - Time above range: 7% - Time below range: 0%  Observed patterns: Improved blood glucose with no lows noted but report is from past month      Diet:  Lunch - Boost protein drink + fruit or vegetables or nachos + salsa Due to her daughters wedding she reports she has been eating out more and going to more parties / showers. She did not wear Continuous Glucose Monitor during this time so  not sure how these events might have affected her blood glucose.   Patient reports no hypoglycemic s/sx including shakiness and sweating. Patient denies hyperglycemic symptoms including no polyuria, polydipsia, polyphagia, nocturia, neuropathy, blurred vision.  Current medication access support: gets [place] and Ozempic from [employer] medication assistance program.    Objective:  Lab Results  Component Value Date   HGBA1C 7.0 (H) 06/19/2023    Lab Results  Component Value Date   CREATININE 0.80 06/19/2023   BUN 17 06/19/2023   NA 141 06/19/2023   K 3.7 06/19/2023   CL 101 06/19/2023   CO2 29 06/19/2023    Lab Results  Component Value Date   CHOL 137 06/19/2023   HDL 47.60 06/19/2023   LDLCALC 66 06/19/2023   LDLDIRECT 83.0 06/26/2022   TRIG 121.0 06/19/2023   CHOLHDL 3 06/19/2023    Medications Reviewed Today     Reviewed by Henrene Pastor, RPH-CPP (Pharmacist) on 07/15/23 at 1050  Med List Status: <None>   Medication Order Taking? Sig Documenting Provider Last Dose Status Informant  amLODipine (NORVASC) 10 MG tablet [id number]  Take 1 tablet (10 mg total) by mouth daily. Donato Schultz, DO  Active   aspirin EC 81 MG tablet [id number] No Take 81 mg by mouth daily. Swallow whole. [provider] Taking Active   atorvastatin (LIPITOR) 80 MG tablet [id number]  Take 1 tablet (80 mg total) by mouth daily. Donato Schultz, [place]  Active   Biotin 1 MG CAPS [id number] No Take  1 capsule by mouth daily. [provider] Taking Active   blood glucose meter kit and supplies KIT [id number] No As directed qd  Patient not taking: Reported on 06/08/2023   Donato Schultz, DO Not Taking Active   Continuous Glucose Sensor (FREESTYLE LIBRE 3 SENSOR) [place] [id number] No Place 1 sensor on the skin every 14 days. Use to check glucose continuously Zola Button, Grayling Congress, DO Taking Active   enalapril (VASOTEC) 10 MG tablet [id number]  Take 1 tablet (10 mg total) by  mouth 2 (two) times daily. Zola Button, Myrene Buddy R, DO  Active   furosemide (LASIX) 20 MG tablet [id number]  TAKE 1 TABLET BY MOUTH DAILY Zola Button, Grayling Congress, DO  Active   glucose blood [facility] VERIO) test strip [id number] No USE ONE STRIP TO TEST DAILY Zola Button, Grayling Congress, DO Taking Active   insulin degludec (TRESIBA FLEXTOUCH) 100 UNIT/ML FlexTouch Pen [id number] No Inject 18 Units into the skin daily. Donato Schultz, DO Taking Active            Med Note Marius Ditch Jun 08, 2023 10:24 AM) Novo Nordisk medication assistance program thru 09/08/2023  Insulin Pen Needle (NOVOFINE PLUS PEN NEEDLE) 32G X 4 MM MISC [id number] No Use twice daily with Levemir Donato Schultz, DO Taking Active   Lancets [facility] DELICA PLUS [place]) MISC [id number] No USE ONE LANCET TO TEST DAILY Donato Schultz, DO Taking Active   metFORMIN (GLUCOPHAGE) 1000 MG tablet [id number]  TAKE ONE TABLET BY MOUTH TWICE A DAY WITH MEALS Seabron Spates R, DO  Active   metoprolol succinate (TOPROL-XL) 50 MG 24 hr tablet [id number]  Take 1 tablet (50 mg total) by mouth daily. Donato Schultz, DO  Active   Multiple Vitamin (MULTIVITAMIN) tablet [id number] No Take 1 tablet by mouth daily. [provider] Taking Active   Omega-3 Fatty Acids (FISH OIL) 1000 MG CAPS [id number] No Take 1 capsule by mouth in the morning and at bedtime. [provider] Taking Active   Semaglutide, 2 MG/DOSE, 8 MG/3ML SOPN [id number] No Inject 2 mg as directed once a week. Donato Schultz, DO Taking Active            Med Note Marius Ditch Mar 09, 2023 11:07 AM) 2024 - Novo Nordisk PAP              Assessment/Plan:   Diabetes:A1c at goal - Last A1c was 7.0% - Reviewed goal A1c, goal fasting, and goal 2 hour post prandial glucose - Reviewed Continuous Glucose Monitor report. Recommended patient start using Continuous Glucose Monitor again - continue Tresiba to 18 units once a day   - Continue Ozempic 2mg  weekly and metformin 1000mg  twice a day  - Continue to limit intake of high sugar and carbohydrate foods - Goal is 45 gram or less of carbohydrates per meal.   - Assisted patient over the phone with online application for [employer] (patient completed on her computer at home) - for [place] for 2025.  - Tried to order 1 more shipment fro 2024 for Ozempic and [place] but patient has already received 3 shipments for 120 day supply in 2024 so she is unable to get again until 2025. Patient endorses that she has enough of both medications to last until January 2025.  - Discussed 2025 Medicare changes and used Medicare.gov to estimate her cost for 2025. Evaristo Bury  should be $35 per month; Cost of Ozempic would initially be about $275 per month thru June 2025, then cost would be $0 as she would meet her $2000 out of pocket amount for Medicare Part D. Patient states $275 would be difficult for her to afford.   Follow Up Plan: January 2025 to check on medication assistance program for 2025.   Henrene Pastor, PharmD Clinical Pharmacist [facility]

## 2023-07-28 ENCOUNTER — Telehealth: Payer: Self-pay | Admitting: Family Medicine

## 2023-07-28 NOTE — Telephone Encounter (Signed)
Patient called and would like a call back states its regarding her PAP application.

## 2023-07-30 NOTE — Telephone Encounter (Signed)
Patient submitted 2025 re-enrollment for Novo on line. The provider portion was completed with paper application. It has been forwarded to PCP for review and signature.  Will then fax to Novo.  Called patient to let her know that 2025 application is in process.

## 2023-08-04 ENCOUNTER — Telehealth: Payer: Self-pay

## 2023-08-04 NOTE — Telephone Encounter (Signed)
Mrs. Hall completed per portion on line today but due to provider address being incorrect for Novo NORDISK TRESIBA, OZEMPIC AND NOVO FINE PEN NEEDLES  PER PHARMACIST TAMMY ECKARD HAS printed PCP portion and forwarded to PCP.    PLEASE BE ADVISED

## 2023-08-04 NOTE — Progress Notes (Signed)
Pharmacy Medication Assistance Program Note    08/04/2023  Patient ID: Jane Young, female   DOB: 31-Jul-1951, 72 y.o.   MRN: [id number]     08/04/2023  Outreach Medication One  Initial Outreach Date (Medication One) 07/15/2023  Manufacturer Medication One [employer] [facility] Tresiba  Dose of Ozempic 2 MG/DOSE, 8 MG/3ML  Dose of Pen Needles 32G X 4 MM  Dose of Tresiba 100 UNIT/ML  Type of Assistance Manufacturer Assistance  Date Application Sent to Patient 07/15/2023       Signature

## 2023-08-04 NOTE — Telephone Encounter (Signed)
-----   Message from Henrene Pastor sent at 07/15/2023 12:16 PM EST ----- Mrs. Brossard completed per portion on line today but due to provider address being incorrect for Novo I have printed PCP portion and forwarded to PCP. Just FYI in case you need to update patient assistance program tracking records.

## 2023-09-23 ENCOUNTER — Telehealth: Payer: Medicare HMO

## 2023-10-13 ENCOUNTER — Telehealth: Payer: Self-pay | Admitting: Family Medicine

## 2023-10-13 NOTE — Telephone Encounter (Signed)
 Copied from CRM [id number]. Topic: Clinical - Medication Refill >> Oct 13, 2023 10:56 AM Richerd A wrote: Most Recent Primary Care Visit:  Provider: CARLA MILLING  Department: [facility]  Visit Type: TELEPHONE OFFICE VISIT  Date: 07/15/2023  Medication: Ozempic   Has the patient contacted their pharmacy? No (Agent: If no, request that the patient contact the pharmacy for the refill. If patient does not wish to contact the pharmacy document the reason why and proceed with request.) (Agent: If yes, when and what did the pharmacy advise?)  Is this the correct pharmacy for this prescription? No If no, delete pharmacy and type the correct one.  This is the patient's preferred pharmacy:  [facility] [id number] [place], [place] - [address] [place] [address] Phone: [phone] Fax: [phone]  PATIENT STATES MEDICATION IS DELIVERED TO THE OFFICE  Has the prescription been filled recently? No  Is the patient out of the medication? Yes LAST INJECTION WAS 10/11/23  Has the patient been seen for an appointment in the last year OR does the patient have an upcoming appointment? Yes  Can we respond through MyChart? Yes  Agent: Please be advised that Rx refills may take up to 3 business days. We ask that you follow-up with your pharmacy.

## 2023-10-14 ENCOUNTER — Encounter: Payer: Self-pay | Admitting: Pharmacist

## 2023-10-14 ENCOUNTER — Other Ambulatory Visit: Payer: Self-pay | Admitting: Pharmacist

## 2023-10-14 MED ORDER — TRESIBA FLEXTOUCH 100 UNIT/ML ~~LOC~~ SOPN: 18.0000 [IU] | PEN_INJECTOR | Freq: Every day | SUBCUTANEOUS | 0 refills | Status: DC

## 2023-10-14 MED ORDER — SEMAGLUTIDE (2 MG/DOSE) 8 MG/3ML ~~LOC~~ SOPN: 2.0000 mg | PEN_INJECTOR | SUBCUTANEOUS | 0 refills | Status: DC

## 2023-10-14 NOTE — Telephone Encounter (Signed)
 See documentation - Novo Nordisk medication assistance program shipping has been delayed. Able to get 30 day voucher for pt to use at local pharmacy for Tresiba  and Ozempic  2mg 

## 2023-10-14 NOTE — Progress Notes (Signed)
 10/14/2023 Name: Jane Young MRN: [id number] DOB: 1950/10/13  Chief Complaint  Patient presents with   Medication Problem    Ozmepic and Tresiba     Jane Young is a 73 y.o. year old female who presented for a telephone visit.   They were referred to the pharmacist by their PCP for assistance in managing diabetes and medication access.    Subjective:  Medication Access/Adherence  Current Pharmacy:  [id number] [place], [place] - [address] [place] [address] Phone: [phone] Fax: [phone]   Patient reports affordability concerns with their medications: Yes  - getting Tresiba  and Ozempic  from [employer] medication assistance program in 2024. She has been approved for 2025 however she has not received her first delivery - [employer]o [employer]isk is behind in processing orders.  Patient reports access/transportation concerns to their pharmacy: No  Patient reports adherence concerns with their medications:  No      Diabetes:  Current medications: Metformin  1000mg  twice a day, Ozempic  2mg  weekly and Tresiba  18 units daily.  .  Patient was previously using Libre 3 Continuous Glucose Monitor to monitor blood glucose but last day that I have data for is 06/15/2023. Epic records show that [place] 3 sensors were filled 06/23/2023 and 09/29/2023 for 28 days supply. Patient reports today she has not restarted using sensors but plans to next week (after the Super Bowl)  Wt Readings from Last 3 Encounters:  06/19/23 214 lb 12.8 oz (97.4 kg)  12/15/22 216 lb 6.4 oz (98.2 kg)  06/26/22 220 lb (99.8 kg)    Patient reports no hypoglycemic s/sx including shakiness and sweating. Patient denies hyperglycemic symptoms including no polyuria, polydipsia, polyphagia, nocturia, neuropathy, blurred vision.  Current medication access support: gets Tresiba  and Ozempic  from Novo Nordisk medication assistance program - approved thry  09/07/2024. At previous appointment discussed 2025 Medicare changes and used Medicare.gov to estimate her cost for 2025. Tresiba  should be $35 per month; Cost of Ozempic  would initially be about $275 per month thru June 2025, then cost would be $0 as she would meet her $2000 out of pocket amount for Medicare Part D. Patient states $275 would be difficult for her to afford.    Objective:  Lab Results  Component Value Date   HGBA1C 7.0 (H) 06/19/2023    Lab Results  Component Value Date   CREATININE 0.80 06/19/2023   BUN 17 06/19/2023   NA 141 06/19/2023   K 3.7 06/19/2023   CL 101 06/19/2023   CO2 29 06/19/2023    Lab Results  Component Value Date   CHOL 137 06/19/2023   HDL 47.60 06/19/2023   LDLCALC 66 06/19/2023   LDLDIRECT 83.0 06/26/2022   TRIG 121.0 06/19/2023   CHOLHDL 3 06/19/2023    Medications Reviewed Today   Medications were not reviewed in this encounter       Assessment/Plan:   Diabetes:A1c at goal - Last A1c was 7.0 - continue Tresiba  to 18 units once a day  - Continue Ozempic  2mg  weekly and metformin  1000mg  twice a day  - Continue to limit intake of high sugar and carbohydrate foods - Goal is 45 gram or less of carbohydrates per meal.   - Coordinated with Novo Nordisk to get a 30 day voucher for both Tresiba  and Ozempic  until her first medication assistance program shipment is processed and received. Prescriptions and voucher information sent to [facility] pharmacy.  BIN: [id number] PCN: [id number] GRP: [id number]  Voucher ID: [id number] Medication: Ozempic  2 mg/mL (1 pen X 86mL/Pen)   BIN: [id number] PCN: [id number] GRP: [id number] Voucher ID: [id number] Medication: Tresiba  100U/ml FlexTouch   Follow Up Plan: 2 to 3 months  Madelin Ray, PharmD Clinical Pharmacist [facility]

## 2023-10-27 NOTE — Telephone Encounter (Signed)
 PAP: Patient assistance application for NOVOFINE TIP, Ozempic, and Evaristo Bury has been approved by PAP Companies: NovoNordisk from 08/09/2023 to 09/07/2024. Medication should be delivered to PAP Delivery: Provider's office. For further shipping updates, please [employer] at [phone]. Patient ID is: NO ID    PLEASE BE ADVISED THERE ARE DELAYS IN SHIPPING MEDICATION IF PT HAS NOT RECEIVED MEDICATION PLEASE HAVE THEM CALL THE NUMBER ABOVE.

## 2023-11-08 ENCOUNTER — Other Ambulatory Visit: Payer: Self-pay | Admitting: Family Medicine

## 2023-11-10 ENCOUNTER — Telehealth: Payer: Self-pay

## 2023-11-10 NOTE — Telephone Encounter (Addendum)
 Pt called and LVM and advises and Tresiba and Ozempic and pen needles has arrived. Shots in frig and pens needles up front

## 2023-12-16 ENCOUNTER — Telehealth: Payer: Self-pay | Admitting: Family Medicine

## 2023-12-16 DIAGNOSIS — R55 Syncope and collapse: Secondary | ICD-10-CM | POA: Diagnosis not present

## 2023-12-16 DIAGNOSIS — R140 Abdominal distension (gaseous): Secondary | ICD-10-CM | POA: Diagnosis not present

## 2023-12-16 DIAGNOSIS — R404 Transient alteration of awareness: Secondary | ICD-10-CM | POA: Diagnosis not present

## 2023-12-17 ENCOUNTER — Ambulatory Visit: Admitting: Family Medicine

## 2023-12-18 NOTE — Telephone Encounter (Signed)
 LM with [facility] for information on deceased in order to sign death certificate.

## 2023-12-21 NOTE — Telephone Encounter (Signed)
 Spoke with [facility].  They were called to the scene 12/09/2023 where patient was found unresponsive on her bedroom floor by her husband. She was pronounced deceased at 14:53 but had died sometime in the night.   Husband states they sleep in different rooms. He had woke in the morning, ran errands, took a nap and then realized she was not up.   FYI

## 2023-12-21 NOTE — Telephone Encounter (Signed)
 Copied from CRM [id number]. Topic: General - Deceased Patient >> December 29, 2023  9:43 AM Alyse July wrote: Name of caller: Unknown  Date of death: Dec 24, 2023   Name of funeral home: [facility]   Phone number of funeral home: [phone]  Provider that needs to sign form: Estill Hemming  Timeline for signing: No timeline provided, but document available to sign within the system per caller.

## 2023-12-22 NOTE — Telephone Encounter (Signed)
 Copied from CRM [id number]. Topic: General - Deceased Patient >> 12/23/2023 10:21 AM Adonis Hoot wrote: Name of caller: Mertie Abt  Date of death: December 17, 2023   Name of funeral home: [facility]  Phone number of funeral home: [phone]  Provider that needs to sign form: Lowne-chase   Timeline for signing: As soon as possible Funeral home called and would ,like to know what would the hold up be with provider signing patient death certificate.Family is trying to complete funerla arrangements.Pateint is going to be cremated

## 2024-01-07 NOTE — Telephone Encounter (Signed)
 Pt's husband called in to inform pcp that pt passed away this morning. Pt's appt for tomorrow has been cancelled.

## 2024-01-07 DEATH — deceased

## 2024-02-17 ENCOUNTER — Telehealth: Payer: Self-pay | Admitting: Pharmacist

## 2024-02-17 NOTE — Telephone Encounter (Signed)
 Office received Tresiba , Ozempic  and novo fine needles from [employer]o [employer]isk patient assistance program. Called to notify program that patient is deceased. [employer] will send label to send product back to them and remove Mrs. Racca from their program. We should not receive an further shipments for her.

## 2024-06-21 ENCOUNTER — Telehealth: Payer: Self-pay

## 2024-06-21 NOTE — Telephone Encounter (Signed)
 Medication was received from [employer]o [employer]isk on 6/810/25 for patient. Patient was diseased at the time. Company was contacted be [facility], pharmacist with the intention to get a return label to return samples.  I also tried to contact company via email as advised by Ozempic 's drug representative. Never heard back from the company.  Medication will be discarded today, as advised per drug representative last week.
